# Patient Record
Sex: Female | Born: 1975 | Race: White | Hispanic: No | Marital: Married | Smoking: Former smoker
Health system: Southern US, Community
[De-identification: ages and names within clinical notes are randomized; demographics above are authoritative.]

## PROBLEM LIST (undated history)

## (undated) DIAGNOSIS — G4733 Obstructive sleep apnea (adult) (pediatric): Secondary | ICD-10-CM

## (undated) DIAGNOSIS — N946 Dysmenorrhea, unspecified: Secondary | ICD-10-CM

## (undated) DIAGNOSIS — I10 Essential (primary) hypertension: Secondary | ICD-10-CM

## (undated) DIAGNOSIS — Z6841 Body Mass Index (BMI) 40.0 and over, adult: Secondary | ICD-10-CM

## (undated) DIAGNOSIS — Z720 Tobacco use: Secondary | ICD-10-CM

## (undated) DIAGNOSIS — M1990 Unspecified osteoarthritis, unspecified site: Secondary | ICD-10-CM

## (undated) DIAGNOSIS — N920 Excessive and frequent menstruation with regular cycle: Secondary | ICD-10-CM

## (undated) DIAGNOSIS — Z87891 Personal history of nicotine dependence: Secondary | ICD-10-CM

## (undated) DIAGNOSIS — K219 Gastro-esophageal reflux disease without esophagitis: Secondary | ICD-10-CM

## (undated) HISTORY — PX: DILATION AND CURETTAGE OF UTERUS: SHX78

## (undated) HISTORY — DX: Personal history of nicotine dependence: Z87.891

## (undated) HISTORY — DX: Excessive and frequent menstruation with regular cycle: N92.0

## (undated) HISTORY — PX: WISDOM TOOTH EXTRACTION: SHX21

## (undated) HISTORY — DX: Essential (primary) hypertension: I10

## (undated) HISTORY — DX: Obstructive sleep apnea (adult) (pediatric): G47.33

## (undated) HISTORY — DX: Morbid (severe) obesity due to excess calories: E66.01

## (undated) HISTORY — PX: BREAST SURGERY: SHX581

## (undated) HISTORY — DX: Body Mass Index (BMI) 40.0 and over, adult: Z684

## (undated) HISTORY — DX: Unspecified osteoarthritis, unspecified site: M19.90

## (undated) HISTORY — DX: Tobacco use: Z72.0

## (undated) HISTORY — DX: Dysmenorrhea, unspecified: N94.6

---

## 2004-03-27 ENCOUNTER — Emergency Department: Payer: Self-pay | Admitting: Unknown Physician Specialty

## 2004-10-21 ENCOUNTER — Emergency Department: Payer: Self-pay | Admitting: Emergency Medicine

## 2005-07-16 ENCOUNTER — Emergency Department: Payer: Self-pay | Admitting: Emergency Medicine

## 2006-03-26 ENCOUNTER — Emergency Department: Payer: Self-pay | Admitting: Emergency Medicine

## 2006-06-19 HISTORY — PX: OTHER SURGICAL HISTORY: SHX169

## 2008-06-03 ENCOUNTER — Ambulatory Visit: Payer: Self-pay | Admitting: Internal Medicine

## 2009-02-26 ENCOUNTER — Ambulatory Visit: Payer: Self-pay | Admitting: Internal Medicine

## 2009-06-16 ENCOUNTER — Ambulatory Visit: Payer: Self-pay | Admitting: Internal Medicine

## 2009-08-03 ENCOUNTER — Ambulatory Visit: Payer: Self-pay | Admitting: Internal Medicine

## 2009-11-13 ENCOUNTER — Ambulatory Visit: Payer: Self-pay | Admitting: Internal Medicine

## 2010-04-13 ENCOUNTER — Ambulatory Visit: Payer: Self-pay | Admitting: Internal Medicine

## 2010-08-02 ENCOUNTER — Ambulatory Visit: Payer: Self-pay | Admitting: Internal Medicine

## 2011-05-02 ENCOUNTER — Ambulatory Visit: Payer: Self-pay | Admitting: Internal Medicine

## 2011-06-20 ENCOUNTER — Ambulatory Visit: Payer: Self-pay | Admitting: Internal Medicine

## 2011-06-20 HISTORY — PX: OTHER SURGICAL HISTORY: SHX169

## 2012-03-02 ENCOUNTER — Ambulatory Visit: Payer: Self-pay

## 2012-03-30 ENCOUNTER — Ambulatory Visit: Payer: Self-pay | Admitting: Medical

## 2012-03-30 LAB — RAPID STREP-A WITH REFLX: Micro Text Report: NEGATIVE

## 2012-04-01 LAB — BETA STREP CULTURE(ARMC)

## 2012-07-02 ENCOUNTER — Ambulatory Visit: Payer: Self-pay

## 2012-07-02 LAB — CBC WITH DIFFERENTIAL/PLATELET
Basophil #: 0.1 10*3/uL (ref 0.0–0.1)
Basophil %: 0.7 %
Eosinophil #: 0.3 10*3/uL (ref 0.0–0.7)
Eosinophil %: 2.2 %
HCT: 39.6 % (ref 35.0–47.0)
Lymphocyte #: 4.2 10*3/uL — ABNORMAL HIGH (ref 1.0–3.6)
Lymphocyte %: 29 %
MCV: 89 fL (ref 80–100)
RBC: 4.43 10*6/uL (ref 3.80–5.20)
RDW: 13.5 % (ref 11.5–14.5)
WBC: 14.4 10*3/uL — ABNORMAL HIGH (ref 3.6–11.0)

## 2013-04-14 ENCOUNTER — Ambulatory Visit
Admission: RE | Admit: 2013-04-14 | Discharge: 2013-04-14 | Disposition: A | Discharge status: Home / Self Care | Source: Ambulatory Visit | Attending: Family Medicine | Admitting: Family Medicine

## 2013-04-14 ENCOUNTER — Ambulatory Visit (INDEPENDENT_AMBULATORY_CARE_PROVIDER_SITE_OTHER): Admitting: Family Medicine

## 2013-04-14 ENCOUNTER — Encounter: Payer: Self-pay | Admitting: Family Medicine

## 2013-04-14 VITALS — BP 140/86 | HR 93 | Temp 98.4°F | Ht 66.25 in | Wt 322.0 lb

## 2013-04-14 DIAGNOSIS — M25519 Pain in unspecified shoulder: Secondary | ICD-10-CM

## 2013-04-14 DIAGNOSIS — R5383 Other fatigue: Secondary | ICD-10-CM

## 2013-04-14 DIAGNOSIS — M25511 Pain in right shoulder: Secondary | ICD-10-CM

## 2013-04-14 DIAGNOSIS — M129 Arthropathy, unspecified: Secondary | ICD-10-CM

## 2013-04-14 DIAGNOSIS — Z720 Tobacco use: Secondary | ICD-10-CM

## 2013-04-14 DIAGNOSIS — F17200 Nicotine dependence, unspecified, uncomplicated: Secondary | ICD-10-CM

## 2013-04-14 DIAGNOSIS — M7500 Adhesive capsulitis of unspecified shoulder: Secondary | ICD-10-CM

## 2013-04-14 DIAGNOSIS — I10 Essential (primary) hypertension: Secondary | ICD-10-CM

## 2013-04-14 DIAGNOSIS — Z3202 Encounter for pregnancy test, result negative: Secondary | ICD-10-CM

## 2013-04-14 DIAGNOSIS — Z131 Encounter for screening for diabetes mellitus: Secondary | ICD-10-CM

## 2013-04-14 DIAGNOSIS — M1990 Unspecified osteoarthritis, unspecified site: Secondary | ICD-10-CM

## 2013-04-14 DIAGNOSIS — G4733 Obstructive sleep apnea (adult) (pediatric): Secondary | ICD-10-CM

## 2013-04-14 DIAGNOSIS — R5381 Other malaise: Secondary | ICD-10-CM

## 2013-04-14 DIAGNOSIS — M7501 Adhesive capsulitis of right shoulder: Secondary | ICD-10-CM

## 2013-04-14 DIAGNOSIS — Z23 Encounter for immunization: Secondary | ICD-10-CM

## 2013-04-14 LAB — CBC WITH DIFFERENTIAL/PLATELET
Basophils Relative: 0.5 % (ref 0.0–3.0)
Eosinophils Absolute: 0.4 10*3/uL (ref 0.0–0.7)
Eosinophils Relative: 2.2 % (ref 0.0–5.0)
HCT: 41.3 % (ref 36.0–46.0)
Hemoglobin: 13.8 g/dL (ref 12.0–15.0)
Lymphocytes Relative: 30.1 % (ref 12.0–46.0)
Lymphs Abs: 5.4 10*3/uL — ABNORMAL HIGH (ref 0.7–4.0)
MCHC: 33.5 g/dL (ref 30.0–36.0)
Monocytes Relative: 5.8 % (ref 3.0–12.0)
Neutro Abs: 11.1 10*3/uL — ABNORMAL HIGH (ref 1.4–7.7)
RBC: 4.61 Mil/uL (ref 3.87–5.11)
RDW: 14.1 % (ref 11.5–14.6)
WBC: 18.1 10*3/uL (ref 4.5–10.5)

## 2013-04-14 LAB — BASIC METABOLIC PANEL
BUN: 15 mg/dL (ref 6–23)
CO2: 32 mEq/L (ref 19–32)
Calcium: 9.7 mg/dL (ref 8.4–10.5)
Glucose, Bld: 99 mg/dL (ref 70–99)
Potassium: 4.5 mEq/L (ref 3.5–5.1)
Sodium: 138 mEq/L (ref 135–145)

## 2013-04-14 NOTE — Patient Instructions (Signed)
REFERRAL: GO THE THE FRONT ROOM AT THE ENTRANCE OF OUR CLINIC, NEAR CHECK IN. ASK FOR Andrea Hull. SHE WILL HELP YOU SET UP YOUR REFERRAL. DATE: TIME:  

## 2013-04-14 NOTE — Progress Notes (Signed)
Date:  04/14/2013   Name:  Andrea Hull   DOB:  03-04-1976   MRN:  [id number] Gender: female Age: 37 y.o.  Primary Physician:  Hannah Beat, Andrea Hull   Chief Complaint: New Patient   History of Present Illness:  Andrea Hull is a 37 y.o. pleasant patient who presents with the following:  New patient: down 26 pounds  OSA with cpap Pepcid as needed  Some knee OA  1 1/2 months ago, for about since then, it was hurting NOW has lost a lot of motion. If jerking it, etc, can be extremely painful.  R - very limited with her str and ROM with abduction Not clear of any distinct injury She has tried taking some OTC NSAIDS Painful at night and wakes her up from sleep Pain throughout the shoulder  Has had a h/o htn in the past, wants to keep trying to lose weight  1 ppd smoker  H/o GERD  Body mass index is 51.57 kg/(m^2).    There are no active problems to display for this patient.   Past Medical History  Diagnosis Date  . Arthritis   . Hypertension   . Chicken pox   . Urinary tract infection     Past Surgical History  Procedure Laterality Date  . Cesarean section    . Breast biospy  2013  . Miscarriage  2008    History   Social History  . Marital Status: Married    Spouse Name: N/A    Number of Children: N/A  . Years of Education: N/A   Occupational History  . Not on file.   Social History Main Topics  . Smoking status: Current Every Day Smoker -- 1.00 packs/day    Types: Cigarettes  . Smokeless tobacco: Never Used  . Alcohol Use: Yes     Comment: rare  . Drug Use: No  . Sexual Activity: Not on file   Other Topics Concern  . Not on file   Social History Narrative  . No narrative on file    Family History  Problem Relation Age of Onset  . Arthritis Mother   . Alcohol abuse Father   . Arthritis Father   . Hyperlipidemia Father   . Hypertension Father   . Diabetes Father   . Personality disorder Sister   . Anxiety disorder Sister   .  Hypertension Brother   . Diabetes Maternal Grandmother   . Lung cancer Maternal Grandfather   . Alcohol abuse Paternal Grandmother   . Breast cancer Paternal Grandmother   . Alcohol abuse Paternal Grandfather   . Arthritis Paternal Grandfather   . Heart disease Paternal Grandfather   . Hypertension Sister     No Known Allergies  Medication list has been reviewed and updated.  No outpatient prescriptions prior to visit.   No facility-administered medications prior to visit.    Review of Systems:   GEN: No fevers, chills. Nontoxic. Primarily MSK c/o today. MSK: Detailed in the HPI GI: tolerating PO intake without difficulty Neuro: No numbness, parasthesias, or tingling associated. Otherwise, the pertinent positives and negatives are listed above and in the HPI, otherwise a full review of systems has been reviewed and is negative unless noted positive.   Physical Examination: BP 140/86  Pulse 93  Temp(Src) 98.4 F (36.9 C) (Oral)  Ht 5' 6.25" (1.683 m)  Wt 322 lb (146.058 kg)  BMI 51.57 kg/m2  LMP 03/27/2013  Ideal Body Weight: Weight in (lb) to have BMI =  25: 155.7   GEN: Well-developed,well-nourished,in no acute distress; alert,appropriate and cooperative throughout examination HEENT: Normocephalic and atraumatic without obvious abnormalities. Ears, externally no deformities CV: rrr, no m/g/r PULM: Breathing comfortably in no respiratory distress EXT: No clubbing, cyanosis, or edema PSYCH: Normally interactive. Cooperative during the interview. Pleasant. Friendly and conversant. Not anxious or depressed appearing. Normal, full affect.  Shoulder: R Inspection: No muscle wasting Ecchymosis/edema: neg  AC joint, scapula, clavicle: mod ttp Cervical spine: NT, full ROM Spurling's: neg Abduction: abduction to 110 degrees, str 4/5 Flexion: flexion to 110 deg, 4/5 IR, lift-off: 5/5, in 90 deg of abd, 80 degree loss of motion compared to contralateral side ER at  neutral: 5/5, in 90 deg of abd, loss of 70 deg compared to contralateral side AC crossover: pos Neer: pos, motion limited Hawkins: pos Drop Test: neg Empty Can: pos Supraspinatus insertion: mild-mod T Bicipital groove: tender Speed's: pos Yergason's: pos Sulcus sign: neg Scapular dyskinesis: marked with attempted abd C5-T1 intact  Neuro: Sensation intact Grip 5/5   Assessment and Plan:  Right shoulder pain - Plan: DG Shoulder Right, MR Shoulder Right Wo Contrast: 6 weeks of R shoulder pain with failure to progress and worsening pain, markedly limiting in a 37 year old. Likely adhesive capsulitis, but primary concern is potential supraspinatus tear as primary cause of adhesive capsulitis. Obtain an MRI of the Right shoulder without contrast to help define if patient has full thickness vs. High grade partial thickness rotator cuff tear to help delineate surgical vs. Conservative management plan of care.   XR, UPT neg, will f/u 10 days for serum hcg prior to xr.  Need for prophylactic vaccination and inoculation against influenza - Plan: Flu Vaccine QUAD 37+ mos PF IM (Fluarix)  Frozen shoulder, right - Plan: Basic metabolic panel, TSH  Screening for diabetes mellitus - Plan: Basic metabolic panel  Other malaise and fatigue - Plan: CBC with Differential, TSH  Pregnancy examination or test, negative result - Plan: POCT urine pregnancy, hCG, quantitative, pregnancy  Sleep apnea, obstructive: cont. CPAP  Tobacco abuse: attempt to dc when can  Hypertension: weight loss  Arthritis  Orders Today:  Orders Placed This Encounter  Procedures  . DG Shoulder Right    Standing Status: Future     Number of Occurrences: 1     Standing Expiration Date: 06/14/2014    Order Specific Question:  Preferred imaging location?    Answer:  [facility]    Order Specific Question:  Reason for exam:    Answer:  right shoulder pain  . MR Shoulder Right Wo Contrast    Standing Status:  Future     Number of Occurrences:      Standing Expiration Date: 06/14/2014    Order Specific Question:  Does the patient have a pacemaker, internal devices, implants, aneury    Answer:  No    Order Specific Question:  Preferred imaging location?    Answer:  External    Order Specific Question:  Reason for exam:    Answer:  concern for potential cuff tear, R shoulder pain x 6 weeks  . Flu Vaccine QUAD 37+ mos PF IM (Fluarix)  . Basic metabolic panel  . CBC with Differential  . TSH  . hCG, quantitative, pregnancy    Standing Status: Future     Number of Occurrences:      Standing Expiration Date: 06/14/2013  . POCT urine pregnancy    Updated Medication List: (Includes new medications, updates to list, dose  adjustments) Meds ordered this encounter  Medications  . famotidine (PEPCID) 20 MG tablet    Sig: Take 20 mg by mouth as needed for heartburn.    Medications Discontinued: There are no discontinued medications.    Signed,  Andrea Galea. Banks Chaikin, Andrea Hull, CAQ Sports Medicine  [facility] at [facility] [address] [place] [address] Phone: [phone] Fax: [phone]

## 2013-04-15 ENCOUNTER — Encounter: Payer: Self-pay | Admitting: Family Medicine

## 2013-04-15 ENCOUNTER — Ambulatory Visit

## 2013-04-15 DIAGNOSIS — R6889 Other general symptoms and signs: Secondary | ICD-10-CM

## 2013-04-15 DIAGNOSIS — G4733 Obstructive sleep apnea (adult) (pediatric): Secondary | ICD-10-CM | POA: Insufficient documentation

## 2013-04-15 DIAGNOSIS — M1990 Unspecified osteoarthritis, unspecified site: Secondary | ICD-10-CM | POA: Insufficient documentation

## 2013-04-15 DIAGNOSIS — Z720 Tobacco use: Secondary | ICD-10-CM | POA: Insufficient documentation

## 2013-04-15 DIAGNOSIS — Z87891 Personal history of nicotine dependence: Secondary | ICD-10-CM | POA: Insufficient documentation

## 2013-04-15 DIAGNOSIS — I10 Essential (primary) hypertension: Secondary | ICD-10-CM | POA: Insufficient documentation

## 2013-04-15 LAB — T3, FREE: T3, Free: 2.7 pg/mL (ref 2.3–4.2)

## 2013-04-15 LAB — T4, FREE: Free T4: 0.77 ng/dL (ref 0.60–1.60)

## 2013-04-16 ENCOUNTER — Encounter: Payer: Self-pay | Admitting: Family Medicine

## 2013-04-24 ENCOUNTER — Other Ambulatory Visit (INDEPENDENT_AMBULATORY_CARE_PROVIDER_SITE_OTHER)

## 2013-04-24 DIAGNOSIS — Z3202 Encounter for pregnancy test, result negative: Secondary | ICD-10-CM

## 2013-04-25 ENCOUNTER — Telehealth (INDEPENDENT_AMBULATORY_CARE_PROVIDER_SITE_OTHER)

## 2013-04-25 DIAGNOSIS — Z3202 Encounter for pregnancy test, result negative: Secondary | ICD-10-CM

## 2013-04-25 LAB — HCG, QUANTITATIVE, PREGNANCY: hCG, Beta Chain, Quant, S: 0.4 m[IU]/mL

## 2013-04-25 NOTE — Telephone Encounter (Signed)
Patient notified to go ahead and reschedule her MRI she has scheduled for Monday, pending BHCG & Shoulder X-rays results.

## 2013-04-25 NOTE — Telephone Encounter (Signed)
Pt left v/m requesting lab results cb today; pt said scheduled for MRI on [name] and needs lab results prior to MRI.

## 2013-04-25 NOTE — Telephone Encounter (Signed)
Patient notified BHCG came back .40 mIU/ml.   Per Dr. Kennis Carina on Monday 04/28/2013 @ 8:00am.  We will send it out STAT and go from there.  Patient is in agreement.

## 2013-04-28 ENCOUNTER — Other Ambulatory Visit (INDEPENDENT_AMBULATORY_CARE_PROVIDER_SITE_OTHER)

## 2013-04-28 ENCOUNTER — Ambulatory Visit (INDEPENDENT_AMBULATORY_CARE_PROVIDER_SITE_OTHER)
Admission: RE | Admit: 2013-04-28 | Discharge: 2013-04-28 | Disposition: A | Discharge status: Home / Self Care | Source: Ambulatory Visit | Attending: Family Medicine | Admitting: Family Medicine

## 2013-04-28 DIAGNOSIS — Z3201 Encounter for pregnancy test, result positive: Secondary | ICD-10-CM

## 2013-04-28 DIAGNOSIS — M25519 Pain in unspecified shoulder: Secondary | ICD-10-CM

## 2013-04-28 LAB — HCG, QUANTITATIVE, PREGNANCY: hCG, Beta Chain, Quant, S: 0 m[IU]/mL

## 2013-05-07 ENCOUNTER — Encounter: Payer: Self-pay | Admitting: Family Medicine

## 2013-05-07 ENCOUNTER — Ambulatory Visit (INDEPENDENT_AMBULATORY_CARE_PROVIDER_SITE_OTHER): Admitting: Family Medicine

## 2013-05-07 VITALS — BP 160/80 | HR 99 | Temp 98.3°F | Ht 66.25 in | Wt 324.5 lb

## 2013-05-07 DIAGNOSIS — M7501 Adhesive capsulitis of right shoulder: Secondary | ICD-10-CM

## 2013-05-07 DIAGNOSIS — M7500 Adhesive capsulitis of unspecified shoulder: Secondary | ICD-10-CM

## 2013-05-07 NOTE — Patient Instructions (Signed)
F/u 6-8 weeks

## 2013-05-07 NOTE — Progress Notes (Signed)
Pre-visit discussion using our clinic review tool. No additional management support is needed unless otherwise documented below in the visit note.  

## 2013-05-07 NOTE — Progress Notes (Signed)
Date:  05/07/2013   Name:  Andrea Hull   DOB:  1975/09/07   MRN:  [id number] Gender: female Age: 37 y.o.  Primary Physician:  Hannah Beat, MD   Chief Complaint: Follow-up   Subjective:   History of Present Illness:  Andrea Hull is a 37 y.o. very pleasant female patient who presents with the following:  Patient is here in followup regarding her RIGHT frozen shoulder. Her last office visit she had some dramatic loss of motion in abduction and flexion, so I obtained an MRI of her RIGHT shoulder to evaluate for potential rotator cuff tear. Her cuff is intact with no evidence of full-thickness tear. She did have some partial-thickness tearing to a small degree and adhesive capsulitis on her MRI. She has been active, and her motion is actually gotten quite a bit better in the last few weeks since our prior visit.  I did review her MRI myself last week.  Past Medical History, Surgical History, Social History, Family History, Problem List, Medications, and Allergies have been reviewed and updated if relevant.  Review of Systems:  GEN: No fevers, chills. Nontoxic. Primarily MSK c/o today. MSK: Detailed in the HPI GI: tolerating PO intake without difficulty Neuro: No numbness, parasthesias, or tingling associated. Otherwise the pertinent positives of the ROS are noted above.   Objective:   Physical Examination: BP 160/80  Pulse 99  Temp(Src) 98.3 F (36.8 C) (Oral)  Ht 5' 6.25" (1.683 m)  Wt 324 lb 8 oz (147.192 kg)  BMI 51.97 kg/m2  LMP 04/04/2013   GEN: WDWN, NAD, Non-toxic, Alert & Oriented x 3 HEENT: Atraumatic, Normocephalic.  Ears and Nose: No external deformity. EXTR: No clubbing/cyanosis/edema NEURO: Normal gait.  PSYCH: Normally interactive. Conversant. Not depressed or anxious appearing.  Calm demeanor.    RIGHT shoulder: Nontender at the a.c. Joint. Nontender near the supraspinatus insertion. Abduction lacks 25. Flexion lacks 10-15. There is a  comfortable loss of approximately 10-15 and external rotation compared to the contralateral side and a 35 loss of internal range of motion compared to the contralateral side, both in 90 of abduction. This is dramatically improved compared to her prior office visit. Strength is 5/5.  Radiology: Dg Shoulder Right  04/28/2013   CLINICAL DATA:  Right shoulder pain.  EXAM: RIGHT SHOULDER - 2+ VIEW  COMPARISON:  None.  FINDINGS: Three views of the right shoulder reveal the bones to be adequately mineralized. There is no evidence of an acute fracture nor dislocation. There is faint calcific density adjacent to the greater tuberosity which may reflect an old avulsion fracture fragment or could reflect calcific tendinitis or bursitis. The bony glenoid appears intact and the [facility] joint is normal in appearance.  IMPRESSION: There is no acute bony abnormality of the right shoulder. I cannot exclude calcific tendinitis or bursitis. Or less likely an old avulsion from the greater tuberosity.   Electronically Signed   By: David  [place]   On: 04/28/2013 15:38    Assessment & Plan:    Frozen shoulder syndrome, right  She is much better. For now, I am just going to have her do some aggressive home physical therapy, and she thinks that she will be able to be compliant with this.  Patient Instructions  F/u 6-8 weeks   Orders Today:  No orders of the defined types were placed in this encounter.    New medications, updates to list, dose adjustments: No orders of the defined types were placed in this encounter.  Signed,  Elpidio Galea. Braylynn Lewing, MD, CAQ Sports Medicine  [facility] at [facility] [address] [place] [address] Phone: [phone] Fax: [phone]  Updated Complete Medication List:   Medication List       This list is accurate as of: 05/07/13 11:59 PM.  Always use your most recent med list.               famotidine 20 MG tablet  Commonly known as:  PEPCID  Take  20 mg by mouth as needed for heartburn.

## 2013-05-16 ENCOUNTER — Encounter: Payer: Self-pay | Admitting: Family Medicine

## 2013-07-02 ENCOUNTER — Ambulatory Visit: Admitting: Family Medicine

## 2014-03-27 ENCOUNTER — Ambulatory Visit

## 2014-03-27 ENCOUNTER — Ambulatory Visit (INDEPENDENT_AMBULATORY_CARE_PROVIDER_SITE_OTHER)

## 2014-03-27 DIAGNOSIS — Z23 Encounter for immunization: Secondary | ICD-10-CM

## 2014-06-29 ENCOUNTER — Ambulatory Visit (INDEPENDENT_AMBULATORY_CARE_PROVIDER_SITE_OTHER): Admitting: Family Medicine

## 2014-06-29 ENCOUNTER — Encounter: Payer: Self-pay | Admitting: Family Medicine

## 2014-06-29 VITALS — BP 152/90 | HR 89 | Temp 98.1°F | Ht 66.25 in | Wt 349.2 lb

## 2014-06-29 DIAGNOSIS — S142XXA Injury of nerve root of cervical spine, initial encounter: Secondary | ICD-10-CM

## 2014-06-29 DIAGNOSIS — S46811A Strain of other muscles, fascia and tendons at shoulder and upper arm level, right arm, initial encounter: Secondary | ICD-10-CM

## 2014-06-29 MED ORDER — AMITRIPTYLINE HCL 25 MG PO TABS: 25.0000 mg | ORAL_TABLET | Freq: Every day | ORAL | Status: DC

## 2014-06-29 MED ORDER — PREDNISONE 20 MG PO TABS: ORAL_TABLET | ORAL | Status: DC

## 2014-06-29 NOTE — Progress Notes (Signed)
Dr. Karleen Hampshire T. Earma Nicolaou, MD, CAQ Sports Medicine Primary [facility] and Sports Medicine [address] [place], [address] Phone: [phone] Fax: [phone]  06/29/2014  Patient: Andrea Hull, MRN: [id number], DOB: Jun 21, 1975, 39 y.o.  Primary Physician:  Hannah Beat, MD  Chief Complaint: Shoulder Pain  Subjective:   Andrea Hull is a 39 y.o. very pleasant female patient who presents with the following:  Thinks pulling back muscle on 12/31 on the R side and around the trap on the right. Dealt with this for about a week and about then not able to sleep all that much and felt some pain in her armput. Constantly moving her armpit. Slept on the recliner - and sometimes it will help with arm up above her head. Constantly feels like falling asleep. Muscle is mostly better.   Armpit and down in the arm. At this point, the area in her trapezius is improving, but she does have pain in the middle of her armpit, and she also has some paresthesias in abnormal sensations and pains going down her arm.  Past Medical History, Surgical History, Social History, Family History, Problem List, Medications, and Allergies have been reviewed and updated if relevant.  GEN: No fevers, chills. Nontoxic. Primarily MSK c/o today. MSK: Detailed in the HPI GI: tolerating PO intake without difficulty Neuro: as above Otherwise the pertinent positives of the ROS are noted above.   Objective:   BP 152/90 mmHg  Pulse 89  Temp(Src) 98.1 F (36.7 C) (Oral)  Ht 5' 6.25" (1.683 m)  Wt 349 lb 4 oz (158.419 kg)  BMI 55.93 kg/m2  LMP 06/16/2014   GEN: Well-developed,well-nourished,in no acute distress; alert,appropriate and cooperative throughout examination HEENT: Normocephalic and atraumatic without obvious abnormalities. Ears, externally no deformities PULM: Breathing comfortably in no respiratory distress EXT: No clubbing, cyanosis, or edema PSYCH: Normally interactive. Cooperative during the  interview. Pleasant. Friendly and conversant. Not anxious or depressed appearing. Normal, full affect.  CERVICAL SPINE EXAM Range of motion: Flexion, extension, lateral bending, and rotation: mild restriction, more moving to the LEFT and looking to the LEFT Pain with terminal motion: mild Spinous Processes: NT SCM: NT Upper paracervical muscles: no significant Upper traps: tender on the RIGHT upper C5-T1 intact, sensation and motor grossly intact, however, slight abnormal sensation and paresthesias surrounding the RIGHT  Arm.  Full range of motion.  No impingement.  Rotator cuff testing, 5/5 strength  Radiology: No results found.  Assessment and Plan:   Trapezius strain, right, initial encounter  Cervical nerve root injury, initial encounter  Grade 1 trapezius strain, and at the same time, the mechanism would suggest a cervical nerve root injury that then causes some altered sensations and paresthesias.  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  Range of motion.  Trial of prednisone burst and taper.  Amitriptyline at night with follow-up in 4-6 weeks if no improvement.  Follow-up: No Follow-up on file.  New Prescriptions   AMITRIPTYLINE (ELAVIL) 25 MG TABLET    Take 1 tablet (25 mg total) by mouth at bedtime.   PREDNISONE (DELTASONE) 20 MG TABLET    2 tabs po for 4 days, then 1 po for 4 days   No orders of the defined types were placed in this encounter.    Signed,  Elpidio Galea. Carr Shartzer, MD   Patient's Medications  New Prescriptions   AMITRIPTYLINE (ELAVIL) 25 MG TABLET    Take 1 tablet (25 mg total) by mouth at bedtime.   PREDNISONE (DELTASONE)  20 MG TABLET    2 tabs po for 4 days, then 1 po for 4 days  Previous Medications   FAMOTIDINE (PEPCID) 20 MG TABLET    Take 20 mg by mouth as needed for heartburn.  Modified Medications   No medications on file  Discontinued Medications   No medications on file

## 2014-06-29 NOTE — Progress Notes (Signed)
Pre visit review using our clinic review tool, if applicable. No additional management support is needed unless otherwise documented below in the visit note. 

## 2014-06-30 ENCOUNTER — Telehealth: Payer: Self-pay | Admitting: Family Medicine

## 2014-06-30 NOTE — Telephone Encounter (Signed)
emmi emailed °

## 2014-10-02 ENCOUNTER — Other Ambulatory Visit: Payer: Self-pay | Admitting: Family Medicine

## 2014-10-02 NOTE — Telephone Encounter (Signed)
Last office visit 06/29/2014.  Last refilled 06/29/2014 for #30 with 2 refills.  Ok to refill?

## 2014-10-20 ENCOUNTER — Encounter: Payer: Self-pay | Admitting: Family Medicine

## 2014-10-20 ENCOUNTER — Ambulatory Visit (INDEPENDENT_AMBULATORY_CARE_PROVIDER_SITE_OTHER): Admitting: Family Medicine

## 2014-10-20 VITALS — BP 142/84 | HR 80 | Temp 98.0°F | Wt 350.0 lb

## 2014-10-20 DIAGNOSIS — J069 Acute upper respiratory infection, unspecified: Secondary | ICD-10-CM

## 2014-10-20 NOTE — Assessment & Plan Note (Signed)
Anticipate viral given short duration. Supportive care as per instructions. Update if not improving as per expected.

## 2014-10-20 NOTE — Progress Notes (Signed)
   BP 142/84 mmHg  Pulse 80  Temp(Src) 98 F (36.7 C) (Oral)  Wt 350 lb (158.759 kg)  LMP 09/04/2014   CC: congestion  Subjective:    Patient ID: Andrea Hull, female    DOB: 09/01/1975, 39 y.o.   MRN: [id number]  HPI: Andrea Loronammie L Spruell is a 39 y.o. female presenting on 10/20/2014 for Cough   Endorses heat leading to high blood pressure.   Mom and son are passing sickness back and forth over last 2 weeks. Mom's started with sinus congestion, rhinorrhea (clear), PNDrainage over the past week. Yesterday started feeling better. L gum pain a few days ago now better.   No fevers/chills, ear pain, no coughing, headaches.  1 ppd smoker No h/o asthma. Does use CPAP for OSA.   Self treated with allegra and nyquil.  Her dad is coming into gown this weekend - he had recent open wound surgery. She wanted to make sure safe to be around him.  Relevant past medical, surgical, family and social history reviewed and updated as indicated. Interim medical history since our last visit reviewed. Allergies and medications reviewed and updated. Current Outpatient Prescriptions on File Prior to Visit  Medication Sig  . famotidine (PEPCID) 20 MG tablet Take 20 mg by mouth as needed for heartburn.   No current facility-administered medications on file prior to visit.    Review of Systems Per HPI unless specifically indicated above     Objective:    BP 142/84 mmHg  Pulse 80  Temp(Src) 98 F (36.7 C) (Oral)  Wt 350 lb (158.759 kg)  LMP 09/04/2014  Wt Readings from Last 3 Encounters:  10/20/14 350 lb (158.759 kg)  06/29/14 349 lb 4 oz (158.419 kg)  05/07/13 324 lb 8 oz (147.192 kg)    Physical Exam  Constitutional: She appears well-developed and well-nourished. No distress.  HENT:  Head: Normocephalic and atraumatic.  Right Ear: Hearing, tympanic membrane, external ear and ear canal normal.  Left Ear: Hearing, tympanic membrane, external ear and ear canal normal.  Nose: Mucosal edema  (mild) present. No rhinorrhea. Right sinus exhibits no maxillary sinus tenderness and no frontal sinus tenderness. Left sinus exhibits no maxillary sinus tenderness and no frontal sinus tenderness.  Mouth/Throat: Uvula is midline and mucous membranes are normal. Posterior oropharyngeal erythema (mild) present. No oropharyngeal exudate, posterior oropharyngeal edema or tonsillar abscesses.  Eyes: Conjunctivae and EOM are normal. Pupils are equal, round, and reactive to light. No scleral icterus.  Neck: Normal range of motion. Neck supple.  Cardiovascular: Normal rate, regular rhythm, normal heart sounds and intact distal pulses.   No murmur heard. Pulmonary/Chest: Effort normal and breath sounds normal. No respiratory distress. She has no wheezes. She has no rales.  Lymphadenopathy:    She has no cervical adenopathy.  Skin: Skin is warm and dry. No rash noted.  Nursing note and vitals reviewed.      Assessment & Plan:   Problem List Items Addressed This Visit    Viral URI - Primary    Anticipate viral given short duration. Supportive care as per instructions. Update if not improving as per expected.          Follow up plan: Return if symptoms worsen or fail to improve.

## 2014-10-20 NOTE — Progress Notes (Signed)
Pre visit review using our clinic review tool, if applicable. No additional management support is needed unless otherwise documented below in the visit note. 

## 2014-10-20 NOTE — Patient Instructions (Signed)
You have a viral upper respiratory infection. Antibiotics are not needed for this.  Viral infections usually take 7-10 days to resolve.   Push fluids and plenty of rest.   Please return if you are not improving as expected, or if you have high fevers (>101.5) or difficulty swallowing or worsening productive cough. Call clinic with questions.  Good to see you today. I hope you start feeling better soon.  

## 2014-11-10 ENCOUNTER — Telehealth: Payer: Self-pay | Admitting: Family Medicine

## 2014-11-10 NOTE — Telephone Encounter (Signed)
Pt is requesting a thyroid test when she get her cpe labs done .

## 2014-11-10 NOTE — Telephone Encounter (Signed)
Lab appointment is scheduled for 11/20/2014 at 9:15 am.

## 2014-11-16 ENCOUNTER — Observation Stay
Admission: EM | Admit: 2014-11-16 | Discharge: 2014-11-19 | Disposition: A | Discharge status: Home / Self Care | Attending: Surgery | Admitting: Surgery

## 2014-11-16 ENCOUNTER — Emergency Department

## 2014-11-16 DIAGNOSIS — F17210 Nicotine dependence, cigarettes, uncomplicated: Secondary | ICD-10-CM | POA: Diagnosis not present

## 2014-11-16 DIAGNOSIS — R110 Nausea: Secondary | ICD-10-CM | POA: Insufficient documentation

## 2014-11-16 DIAGNOSIS — K8010 Calculus of gallbladder with chronic cholecystitis without obstruction: Secondary | ICD-10-CM | POA: Diagnosis not present

## 2014-11-16 DIAGNOSIS — R1011 Right upper quadrant pain: Secondary | ICD-10-CM

## 2014-11-16 DIAGNOSIS — Z6841 Body Mass Index (BMI) 40.0 and over, adult: Secondary | ICD-10-CM | POA: Diagnosis not present

## 2014-11-16 DIAGNOSIS — R1013 Epigastric pain: Secondary | ICD-10-CM

## 2014-11-16 DIAGNOSIS — K219 Gastro-esophageal reflux disease without esophagitis: Secondary | ICD-10-CM | POA: Diagnosis not present

## 2014-11-16 DIAGNOSIS — I10 Essential (primary) hypertension: Secondary | ICD-10-CM | POA: Diagnosis not present

## 2014-11-16 DIAGNOSIS — M1990 Unspecified osteoarthritis, unspecified site: Secondary | ICD-10-CM | POA: Diagnosis not present

## 2014-11-16 DIAGNOSIS — K851 Biliary acute pancreatitis without necrosis or infection: Secondary | ICD-10-CM | POA: Diagnosis present

## 2014-11-16 DIAGNOSIS — K859 Acute pancreatitis, unspecified: Secondary | ICD-10-CM

## 2014-11-16 DIAGNOSIS — G4733 Obstructive sleep apnea (adult) (pediatric): Secondary | ICD-10-CM | POA: Insufficient documentation

## 2014-11-16 DIAGNOSIS — K8020 Calculus of gallbladder without cholecystitis without obstruction: Secondary | ICD-10-CM

## 2014-11-16 LAB — CBC WITH DIFFERENTIAL/PLATELET
Basophils Absolute: 0.1 10*3/uL (ref 0–0.1)
Basophils Relative: 1 %
EOS PCT: 3 %
Eosinophils Absolute: 0.5 10*3/uL (ref 0–0.7)
HCT: 42.4 % (ref 35.0–47.0)
Hemoglobin: 14.1 g/dL (ref 12.0–16.0)
LYMPHS PCT: 22 %
Lymphs Abs: 4.2 10*3/uL — ABNORMAL HIGH (ref 1.0–3.6)
MCH: 30.4 pg (ref 26.0–34.0)
MCHC: 33.2 g/dL (ref 32.0–36.0)
MCV: 91.6 fL (ref 80.0–100.0)
MONOS PCT: 7 %
Monocytes Absolute: 1.4 10*3/uL — ABNORMAL HIGH (ref 0.2–0.9)
Neutro Abs: 13.1 10*3/uL — ABNORMAL HIGH (ref 1.4–6.5)
Neutrophils Relative %: 67 %
Platelets: 311 10*3/uL (ref 150–440)
RBC: 4.63 MIL/uL (ref 3.80–5.20)
RDW: 14.6 % — AB (ref 11.5–14.5)
WBC: 19.3 10*3/uL — AB (ref 3.6–11.0)

## 2014-11-16 LAB — COMPREHENSIVE METABOLIC PANEL
ALK PHOS: 164 U/L — AB (ref 38–126)
ALT: 227 U/L — ABNORMAL HIGH (ref 14–54)
AST: 70 U/L — ABNORMAL HIGH (ref 15–41)
Albumin: 3.9 g/dL (ref 3.5–5.0)
Anion gap: 9 (ref 5–15)
BILIRUBIN TOTAL: 0.8 mg/dL (ref 0.3–1.2)
BUN: 14 mg/dL (ref 6–20)
CALCIUM: 9.5 mg/dL (ref 8.9–10.3)
CO2: 24 mmol/L (ref 22–32)
Chloride: 104 mmol/L (ref 101–111)
Creatinine, Ser: 0.81 mg/dL (ref 0.44–1.00)
GFR calc Af Amer: >60 mL/min (ref >60)
GLUCOSE: 115 mg/dL — AB (ref 65–99)
Potassium: 3.9 mmol/L (ref 3.5–5.1)
Sodium: 137 mmol/L (ref 135–145)
TOTAL PROTEIN: 8 g/dL (ref 6.5–8.1)

## 2014-11-16 LAB — URINALYSIS COMPLETE WITH MICROSCOPIC (ARMC ONLY)
Bilirubin Urine: NEGATIVE
Glucose, UA: NEGATIVE mg/dL
LEUKOCYTES UA: NEGATIVE
NITRITE: NEGATIVE
Protein, ur: 100 mg/dL — AB
Specific Gravity, Urine: 1.025 (ref 1.005–1.030)
pH: 5 (ref 5.0–8.0)

## 2014-11-16 LAB — TROPONIN I: Troponin I: <0.03 ng/mL (ref <0.031)

## 2014-11-16 LAB — LIPASE, BLOOD: LIPASE: 184 U/L — AB (ref 22–51)

## 2014-11-16 LAB — POCT PREGNANCY, URINE: Preg Test, Ur: NEGATIVE

## 2014-11-16 MED ORDER — MORPHINE SULFATE 4 MG/ML IJ SOLN
4.0000 mg | Freq: Once | INTRAMUSCULAR | Status: AC
  Administered 2014-11-17: 4 mg via INTRAVENOUS

## 2014-11-16 MED ORDER — SODIUM CHLORIDE 0.9 % IV BOLUS (SEPSIS)
1000.0000 mL | Freq: Once | INTRAVENOUS | Status: AC
  Administered 2014-11-17: 1000 mL via INTRAVENOUS

## 2014-11-16 MED ORDER — ONDANSETRON HCL 4 MG/2ML IJ SOLN
4.0000 mg | Freq: Once | INTRAMUSCULAR | Status: AC
  Administered 2014-11-17: 4 mg via INTRAVENOUS

## 2014-11-16 NOTE — ED Notes (Signed)
Pt here with c/o upper and pain since Friday. Pt states that the pain increases when she lays down, pt also states that she has also had nausea. Pt vomited x1 on Friday. Pt was ambulatory to treatment room and pt in NAD at this time, pt has stable vitals.

## 2014-11-16 NOTE — ED Provider Notes (Signed)
[facility] Emergency Department Provider Note  ____________________________________________  Time seen: Approximately 11:35 PM  I have reviewed the triage vital signs and the nursing notes.   HISTORY  Chief Complaint Abdominal Pain    HPI Andrea Hull is a 39 y.o. female who presents with a four-day history of upper abdominal pain. Patient describes 8/10 pain to right upper quadrant and epigastrium waxing/waning pain worse after eating. Pain radiates across the upper abdomen and is associated with nausea. Patient denies fever, chills, vomiting, diarrhea, chest pain, shortness of breath, headache, weakness, numbness, tingling. In addition to being worse with food, patient also states that the pain increases when she lays supine.Patient has never experienced this type of pain before.   Past Medical History  Diagnosis Date  . Arthritis   . Hypertension   . Sleep apnea, obstructive   . Morbid obesity with BMI of 50.0-59.9, adult   . Tobacco abuse     Patient Active Problem List   Diagnosis Date Noted  . Acute gallstone pancreatitis 11/17/2014  . Viral URI 10/20/2014  . Morbid obesity with BMI of 50.0-59.9, adult 04/15/2013  . Sleep apnea, obstructive   . Tobacco abuse   . Hypertension   . Arthritis     Past Surgical History  Procedure Laterality Date  . Cesarean section    . Breast biospy  2013  . Miscarriage  2008    No current outpatient prescriptions on file.  Allergies Review of patient's allergies indicates no known allergies.  Family History  Problem Relation Age of Onset  . Arthritis Mother   . Alcohol abuse Father   . Arthritis Father   . Hyperlipidemia Father   . Hypertension Father   . Diabetes Father   . Personality disorder Sister   . Anxiety disorder Sister   . Hypertension Brother   . Diabetes Maternal Grandmother   . Lung cancer Maternal Grandfather   . Alcohol abuse Paternal Grandmother   . Breast cancer Paternal  Grandmother   . Alcohol abuse Paternal Grandfather   . Arthritis Paternal Grandfather   . Heart disease Paternal Grandfather   . Hypertension Sister     Social History History  Substance Use Topics  . Smoking status: Current Every Day Smoker -- 1.00 packs/day    Types: Cigarettes  . Smokeless tobacco: Never Used  . Alcohol Use: Yes     Comment: rare    Review of Systems Constitutional: No fever/chills Eyes: No visual changes. ENT: No sore throat. Cardiovascular: Denies chest pain. Respiratory: Denies shortness of breath. Gastrointestinal: Positive for abdominal pain.  Positive for nausea, no vomiting.  No diarrhea.  No constipation. Genitourinary: Negative for dysuria. LMP now. Musculoskeletal: Negative for back pain. Skin: Negative for rash. Neurological: Negative for headaches, focal weakness or numbness.  10-point ROS otherwise negative.  ____________________________________________   PHYSICAL EXAM:  VITAL SIGNS: ED Triage Vitals  Enc Vitals Group     BP 11/16/14 2224 146/98 mmHg     Pulse Rate 11/16/14 2224 107     Resp 11/16/14 2224 18     Temp 11/16/14 2224 98.5 F (36.9 C)     Temp Source 11/16/14 2224 Oral     SpO2 11/16/14 2224 100 %     Weight 11/16/14 2224 341 lb (154.677 kg)     Height 11/16/14 2224 5\' 6"  (1.676 m)     Head Cir --      Peak Flow --      Pain Score --  Pain Loc --      Pain Edu? --      Excl. in GC? --     Constitutional: Alert and oriented. Well appearing and in mild acute distress. Eyes: Conjunctivae are normal. PERRL. EOMI. Head: Atraumatic. Nose: No congestion/rhinnorhea. Mouth/Throat: Mucous membranes are moist.  Oropharynx non-erythematous. Neck: No stridor.   Cardiovascular: Normal rate, regular rhythm. Grossly normal heart sounds.  Good peripheral circulation. Respiratory: Normal respiratory effort.  No retractions. Lungs CTAB. Gastrointestinal: Obese, soft, mildly tender to palpation bilateral upper quadrants and  epigastrium without rebound or guarding.. No distention. No abdominal bruits. No CVA tenderness. Musculoskeletal: No lower extremity tenderness nor edema.  No joint effusions. Neurologic:  Normal speech and language. No gross focal neurologic deficits are appreciated. Speech is normal. No gait instability. Skin:  Skin is warm, dry and intact. No rash noted. Psychiatric: Mood and affect are normal. Speech and behavior are normal.  ____________________________________________   LABS (all labs ordered are listed, but only abnormal results are displayed)  Labs Reviewed  CBC WITH DIFFERENTIAL/PLATELET - Abnormal; Notable for the following:    WBC 19.3 (*)    RDW 14.6 (*)    Neutro Abs 13.1 (*)    Lymphs Abs 4.2 (*)    Monocytes Absolute 1.4 (*)    All other components within normal limits  COMPREHENSIVE METABOLIC PANEL - Abnormal; Notable for the following:    Glucose, Bld 115 (*)    AST 70 (*)    ALT 227 (*)    Alkaline Phosphatase 164 (*)    All other components within normal limits  LIPASE, BLOOD - Abnormal; Notable for the following:    Lipase 184 (*)    All other components within normal limits  URINALYSIS COMPLETEWITH MICROSCOPIC ([facility] ONLY) - Abnormal; Notable for the following:    Color, Urine AMBER (*)    APPearance CLOUDY (*)    Ketones, ur TRACE (*)    Hgb urine dipstick 2+ (*)    Protein, ur 100 (*)    Bacteria, UA FEW (*)    Squamous Epithelial / LPF 6-30 (*)    All other components within normal limits  TROPONIN I  COMPREHENSIVE METABOLIC PANEL  CBC WITH DIFFERENTIAL/PLATELET  LIPASE, BLOOD  POC URINE PREG, ED  POCT PREGNANCY, URINE   ____________________________________________  EKG  None ____________________________________________  RADIOLOGY  Ultrasound abdomen limited interpreted per Dr. Phill Myron: Cholelithiasis with positive sonographic Murphy sign. No other sonographic evidence for acute cholecystitis such as gallbladder wall thickening or  free pericholecystic fluid. No biliary dilatation. ____________________________________________   PROCEDURES  Procedure(s) performed: None  Critical Care performed: No  ____________________________________________   INITIAL IMPRESSION / ASSESSMENT AND PLAN / ED COURSE  Pertinent labs & imaging results that were available during my care of the patient were reviewed by me and considered in my medical decision making (see chart for details).  39 year old female who presents with a 4 day history of upper abdominal pain worse with eating. Leukocytosis, mildly elevated LFTs and elevated lipase noted. Plan for IV analgesia and ultrasound to evaluate biliary tree.  ----------------------------------------- 1:40 AM on 11/17/2014 -----------------------------------------  Patient improved after morphine. Discussed case with Dr. Juliann Pulse who will evaluate patient in the ED. ____________________________________________   FINAL CLINICAL IMPRESSION(S) / ED DIAGNOSES  Final diagnoses:  Epigastric pain  Acute pancreatitis, unspecified pancreatitis type  Calculus of gallbladder without cholecystitis without obstruction      Irean Hong, MD 11/17/14 [phone]

## 2014-11-17 ENCOUNTER — Encounter
Admission: EM | Disposition: A | Discharge status: Home / Self Care | Payer: Self-pay | Source: Home / Self Care | Attending: Emergency Medicine

## 2014-11-17 ENCOUNTER — Observation Stay: Admitting: Anesthesiology

## 2014-11-17 ENCOUNTER — Observation Stay

## 2014-11-17 ENCOUNTER — Encounter: Payer: Self-pay | Admitting: Emergency Medicine

## 2014-11-17 DIAGNOSIS — K851 Biliary acute pancreatitis without necrosis or infection: Secondary | ICD-10-CM | POA: Diagnosis present

## 2014-11-17 HISTORY — PX: CHOLECYSTECTOMY: SHX55

## 2014-11-17 LAB — CBC WITH DIFFERENTIAL/PLATELET
BASOS PCT: 1 %
Basophils Absolute: 0.1 10*3/uL (ref 0–0.1)
Eosinophils Absolute: 0.4 10*3/uL (ref 0–0.7)
Eosinophils Relative: 3 %
HEMATOCRIT: 37.8 % (ref 35.0–47.0)
Hemoglobin: 12.5 g/dL (ref 12.0–16.0)
LYMPHS PCT: 25 %
Lymphs Abs: 3.7 10*3/uL — ABNORMAL HIGH (ref 1.0–3.6)
MCH: 30.5 pg (ref 26.0–34.0)
MCHC: 33.1 g/dL (ref 32.0–36.0)
MCV: 92 fL (ref 80.0–100.0)
MONO ABS: 1 10*3/uL — AB (ref 0.2–0.9)
Monocytes Relative: 7 %
NEUTROS PCT: 64 %
Neutro Abs: 9.6 10*3/uL — ABNORMAL HIGH (ref 1.4–6.5)
PLATELETS: 274 10*3/uL (ref 150–440)
RBC: 4.11 MIL/uL (ref 3.80–5.20)
RDW: 14.3 % (ref 11.5–14.5)
WBC: 14.8 10*3/uL — ABNORMAL HIGH (ref 3.6–11.0)

## 2014-11-17 LAB — COMPREHENSIVE METABOLIC PANEL
ALK PHOS: 139 U/L — AB (ref 38–126)
ALT: 182 U/L — AB (ref 14–54)
AST: 54 U/L — ABNORMAL HIGH (ref 15–41)
Albumin: 3.5 g/dL (ref 3.5–5.0)
Anion gap: 9 (ref 5–15)
BILIRUBIN TOTAL: 0.9 mg/dL (ref 0.3–1.2)
BUN: 12 mg/dL (ref 6–20)
CO2: 24 mmol/L (ref 22–32)
Calcium: 8.7 mg/dL — ABNORMAL LOW (ref 8.9–10.3)
Chloride: 104 mmol/L (ref 101–111)
Creatinine, Ser: 0.71 mg/dL (ref 0.44–1.00)
GFR calc non Af Amer: >60 mL/min (ref >60)
GLUCOSE: 91 mg/dL (ref 65–99)
Potassium: 3.5 mmol/L (ref 3.5–5.1)
Sodium: 137 mmol/L (ref 135–145)
Total Protein: 6.8 g/dL (ref 6.5–8.1)

## 2014-11-17 LAB — LIPASE, BLOOD: Lipase: 148 U/L — ABNORMAL HIGH (ref 22–51)

## 2014-11-17 SURGERY — LAPAROSCOPIC CHOLECYSTECTOMY WITH INTRAOPERATIVE CHOLANGIOGRAM
Anesthesia: General | Site: Abdomen | Wound class: Clean Contaminated

## 2014-11-17 MED ORDER — ONDANSETRON HCL 4 MG/2ML IJ SOLN
4.0000 mg | Freq: Once | INTRAMUSCULAR | Status: AC | PRN
  Administered 2014-11-17: 4 mg via INTRAVENOUS

## 2014-11-17 MED ORDER — HYDROMORPHONE HCL 1 MG/ML IJ SOLN
0.5000 mg | INTRAMUSCULAR | Status: DC | PRN
  Administered 2014-11-17 (×2): 0.5 mg via INTRAVENOUS

## 2014-11-17 MED ORDER — LACTATED RINGERS IV SOLN
INTRAVENOUS | Status: DC
  Administered 2014-11-17 (×3): via INTRAVENOUS

## 2014-11-17 MED ORDER — HEPARIN SODIUM (PORCINE) 5000 UNIT/ML IJ SOLN
5000.0000 [IU] | Freq: Three times a day (TID) | INTRAMUSCULAR | Status: DC
  Administered 2014-11-17 – 2014-11-19 (×6): 5000 [IU] via SUBCUTANEOUS
  Filled 2014-11-17 (×6): qty 1

## 2014-11-17 MED ORDER — SUGAMMADEX SODIUM 200 MG/2ML IV SOLN
INTRAVENOUS | Status: DC | PRN
  Administered 2014-11-17: 309.4 mg via INTRAVENOUS

## 2014-11-17 MED ORDER — FENTANYL CITRATE (PF) 100 MCG/2ML IJ SOLN
INTRAMUSCULAR | Status: DC | PRN
  Administered 2014-11-17: 50 ug via INTRAVENOUS
  Administered 2014-11-17 (×2): 100 ug via INTRAVENOUS

## 2014-11-17 MED ORDER — SODIUM CHLORIDE 0.9 % IV SOLN
INTRAVENOUS | Status: DC
  Administered 2014-11-17 (×2): via INTRAVENOUS

## 2014-11-17 MED ORDER — PROPOFOL 10 MG/ML IV BOLUS
INTRAVENOUS | Status: DC | PRN
  Administered 2014-11-17: 200 mg via INTRAVENOUS

## 2014-11-17 MED ORDER — SODIUM CHLORIDE 0.9 % IR SOLN
Status: DC | PRN
  Administered 2014-11-17: 1000 mL

## 2014-11-17 MED ORDER — MIDAZOLAM HCL 2 MG/2ML IJ SOLN
INTRAMUSCULAR | Status: DC | PRN
  Administered 2014-11-17: 2 mg via INTRAVENOUS

## 2014-11-17 MED ORDER — HYDROMORPHONE HCL 1 MG/ML IJ SOLN
0.5000 mg | INTRAMUSCULAR | Status: DC | PRN
  Administered 2014-11-17 (×2): 1 mg via INTRAVENOUS
  Filled 2014-11-17 (×2): qty 1

## 2014-11-17 MED ORDER — KETOROLAC TROMETHAMINE 30 MG/ML IJ SOLN
INTRAMUSCULAR | Status: DC | PRN
  Administered 2014-11-17: 30 mg via INTRAVENOUS

## 2014-11-17 MED ORDER — PANTOPRAZOLE SODIUM 40 MG IV SOLR
40.0000 mg | Freq: Every day | INTRAVENOUS | Status: DC
  Administered 2014-11-17 – 2014-11-18 (×2): 40 mg via INTRAVENOUS
  Filled 2014-11-17 (×2): qty 40

## 2014-11-17 MED ORDER — ROCURONIUM BROMIDE 100 MG/10ML IV SOLN
INTRAVENOUS | Status: DC | PRN
  Administered 2014-11-17: 40 mg via INTRAVENOUS
  Administered 2014-11-17: 10 mg via INTRAVENOUS

## 2014-11-17 MED ORDER — HYDROMORPHONE HCL 1 MG/ML IJ SOLN
1.0000 mg | INTRAMUSCULAR | Status: DC | PRN
  Administered 2014-11-17 – 2014-11-19 (×12): 1 mg via INTRAVENOUS
  Filled 2014-11-17 (×12): qty 1

## 2014-11-17 MED ORDER — ONDANSETRON HCL 4 MG/2ML IJ SOLN
4.0000 mg | Freq: Four times a day (QID) | INTRAMUSCULAR | Status: DC | PRN
  Administered 2014-11-17 – 2014-11-18 (×5): 4 mg via INTRAVENOUS
  Filled 2014-11-17 (×5): qty 2

## 2014-11-17 MED ORDER — FENTANYL CITRATE (PF) 100 MCG/2ML IJ SOLN
25.0000 ug | INTRAMUSCULAR | Status: AC | PRN
  Administered 2014-11-17 (×6): 25 ug via INTRAVENOUS

## 2014-11-17 MED ORDER — AMITRIPTYLINE HCL 50 MG PO TABS: 25.0000 mg | ORAL_TABLET | Freq: Every evening | ORAL | Status: DC | PRN

## 2014-11-17 MED ORDER — ONDANSETRON HCL 4 MG/2ML IJ SOLN
INTRAMUSCULAR | Status: AC
  Filled 2014-11-17: qty 2

## 2014-11-17 MED ORDER — CEFAZOLIN SODIUM-DEXTROSE 2-3 GM-% IV SOLR
2.0000 g | Freq: Once | INTRAVENOUS | Status: AC
  Administered 2014-11-17 (×2): 2 g via INTRAVENOUS
  Filled 2014-11-17: qty 50

## 2014-11-17 MED ORDER — MORPHINE SULFATE 4 MG/ML IJ SOLN
INTRAMUSCULAR | Status: AC
  Filled 2014-11-17: qty 1

## 2014-11-17 MED ORDER — BUPIVACAINE HCL 0.25 % IJ SOLN
INTRAMUSCULAR | Status: DC | PRN
  Administered 2014-11-17: 30 mL

## 2014-11-17 MED ORDER — SUCCINYLCHOLINE CHLORIDE 20 MG/ML IJ SOLN
INTRAMUSCULAR | Status: DC | PRN
  Administered 2014-11-17: 120 mg via INTRAVENOUS

## 2014-11-17 SURGICAL SUPPLY — 37 items
APPLIER CLIP ROT 10 11.4 M/L (STAPLE) ×2
BAG COUNTER SPONGE EZ (MISCELLANEOUS) ×2 IMPLANT
CANISTER SUCT 1200ML W/VALVE (MISCELLANEOUS) ×2 IMPLANT
CATH REDDICK CHOLANGI 4FR 50CM (CATHETERS) ×2 IMPLANT
CHLORAPREP W/TINT 26ML (MISCELLANEOUS) ×2 IMPLANT
CLIP APPLIE ROT 10 11.4 M/L (STAPLE) ×1 IMPLANT
DRAIN CHANNEL JP 19F (MISCELLANEOUS) ×2 IMPLANT
DRAPE SHEET LG 3/4 BI-LAMINATE (DRAPES) ×2 IMPLANT
DRSG TEGADERM 2-3/8X2-3/4 SM (GAUZE/BANDAGES/DRESSINGS) ×8 IMPLANT
DRSG TELFA 3X8 NADH (GAUZE/BANDAGES/DRESSINGS) ×2 IMPLANT
GLOVE BIO SURGEON STRL SZ7.5 (GLOVE) ×2 IMPLANT
GLOVE INDICATOR 8.0 STRL GRN (GLOVE) ×2 IMPLANT
GOWN STRL REUS W/ TWL LRG LVL3 (GOWN DISPOSABLE) ×1 IMPLANT
GOWN STRL REUS W/TWL LRG LVL3 (GOWN DISPOSABLE) ×1
GRASPER SUT TROCAR 14GX15 (MISCELLANEOUS) ×4 IMPLANT
IRRIGATION STRYKERFLOW (MISCELLANEOUS) ×1 IMPLANT
IRRIGATOR STRYKERFLOW (MISCELLANEOUS) ×2
NEEDLE 18GX1X1/2 (RX/OR ONLY) (NEEDLE) ×2 IMPLANT
NEEDLE HYPO 25X1 1.5 SAFETY (NEEDLE) ×2 IMPLANT
NEEDLE INSUFFLATION 14GA 120MM (NEEDLE) ×2 IMPLANT
PACK LAP CHOLECYSTECTOMY (MISCELLANEOUS) ×2 IMPLANT
PAD GROUND ADULT SPLIT (MISCELLANEOUS) ×2 IMPLANT
POUCH ENDO CATCH 10MM SPEC (MISCELLANEOUS) IMPLANT
SCISSORS METZENBAUM CVD 33 (INSTRUMENTS) ×2 IMPLANT
SEAL FOR SCOPE WARMER C3101 (MISCELLANEOUS) ×2 IMPLANT
SLEEVE ADV FIXATION 5X100MM (TROCAR) ×2 IMPLANT
STRAP SAFETY BODY (MISCELLANEOUS) ×2 IMPLANT
SUT ETHILON 5-0 (SUTURE) ×1
SUT ETHILON 5-0 C-3 18XMFL BLK (SUTURE) ×1
SUT VIC AB 0 CT2 27 (SUTURE) ×4 IMPLANT
SUTURE ETHLN 5-0 C3 18XMF BLK (SUTURE) ×1 IMPLANT
SYR 3ML LL SCALE MARK (SYRINGE) ×2 IMPLANT
TROCAR 12M 150ML BLUNT (TROCAR) ×2 IMPLANT
TROCAR Z-THREAD FIOS 11X100 BL (TROCAR) ×2 IMPLANT
TROCAR Z-THREAD OPTICAL 5X100M (TROCAR) ×2 IMPLANT
TROCAR Z-THREAD SLEEVE 11X100 (TROCAR) ×2 IMPLANT
TUBING INSUFFLATOR HI FLOW (MISCELLANEOUS) ×2 IMPLANT

## 2014-11-17 NOTE — Transfer of Care (Signed)
Immediate Anesthesia Transfer of Care Note  Patient: Andrea Hull  Procedure(s) Performed: Procedure(s): LAPAROSCOPIC CHOLECYSTECTOMY WITH INTRAOPERATIVE CHOLANGIOGRAM (N/A)  Patient Location: PACU  Anesthesia Type:General  Level of Consciousness: awake, alert  and oriented  Airway & Oxygen Therapy: Patient Spontanous Breathing and Patient connected to face mask oxygen  Post-op Assessment: Report given to RN and Post -op Vital signs reviewed and stable  Post vital signs: Reviewed and stable  Last Vitals:  Filed Vitals:   11/17/14 1547  BP: 158/70  Pulse: 94  Temp: 37 C  Resp: 16    Complications: No apparent anesthesia complications

## 2014-11-17 NOTE — Progress Notes (Signed)
Chart reviewed, patient examined. Laboratory work reviewed and ultrasound reviewed. This patient demonstrates symptomatic biliary tract disease. With a very large single stone in her gallbladder I doubt she has significant ductal obstruction. Because of her persistent symptoms and elevated white blood cell count we will recommend surgical intervention. I discussed risks benefits and options for laparoscopic cholecystectomy with the patient and she is in agreement.

## 2014-11-17 NOTE — H&P (Signed)
Surgery history and physical  CC: Epigastric pain  HPI:  39 yo F who presents with 4 days of epigastric pain.  Began acutely 4 days ago.  Worsening.  + nausea, no emesis.  No diarrhea.  Last PO was 14 hours ago.  No fevers/chills.  Colicky pain.  No unusual ingestions, no sick contacts.  No headache, chest pain, shortness of breath, cough, dysuria/hematuria.  Active Ambulatory Problems    Diagnosis Date Noted  . Sleep apnea, obstructive   . Morbid obesity with BMI of 50.0-59.9, adult 04/15/2013  . Tobacco abuse   . Hypertension   . Arthritis   . Viral URI 10/20/2014   Resolved Ambulatory Problems    Diagnosis Date Noted  . No Resolved Ambulatory Problems   No Additional Past Medical History   History   Social History  . Marital Status: Married    Spouse Name: N/A  . Number of Children: N/A  . Years of Education: N/A   Occupational History  . Not on file.   Social History Main Topics  . Smoking status: Current Every Day Smoker -- 1.00 packs/day    Types: Cigarettes  . Smokeless tobacco: Never Used  . Alcohol Use: Yes     Comment: rare  . Drug Use: No  . Sexual Activity: Not on file   Other Topics Concern  . Not on file   Social History Narrative   Family History  Problem Relation Age of Onset  . Arthritis Mother   . Alcohol abuse Father   . Arthritis Father   . Hyperlipidemia Father   . Hypertension Father   . Diabetes Father   . Personality disorder Sister   . Anxiety disorder Sister   . Hypertension Brother   . Diabetes Maternal Grandmother   . Lung cancer Maternal Grandfather   . Alcohol abuse Paternal Grandmother   . Breast cancer Paternal Grandmother   . Alcohol abuse Paternal Grandfather   . Arthritis Paternal Grandfather   . Heart disease Paternal Grandfather   . Hypertension Sister    Review of systems:  Full review of systems obtained, pertinent positives and negatives per HPI.  Blood pressure 152/74, pulse 85, temperature 98.1 F (36.7 C),  temperature source Oral, resp. rate 20, height 5\' 6"  (1.676 m), weight 154.677 kg (341 lb), last menstrual period 09/04/2014, SpO2 96 %.  GEN: NAD/A&Ox3, obese HEAD: normocephalic, atraumatic EYES: no scleral icterus, no conjunctivitis FACE: no obvious facial trauma, normal external ears and nose CV: RRR, no mrg RESP: moving air well, clear to auscultation ABD: soft, nondistended, tender RUQ> LUQ EXT: moving all ext well, strength 5/5 NEURO: CN II-XII grossly intact, sensation intact all 4 ext  Labs WBC 19.3, 67% neutrophils Lipase 184 (51 upper limit of normal)  US: + sonographic murphys sign, + large ?immobile gallstone, no pericholecystic fluid, no gb wall thickening  A/P  39 yo F with epigastric pain, mildly elevated lipase, large gallstone.  Favor gallstone pancreatitis vs symptomatic cholelithiasis.  Will admit for IVF, NPO, consent for lap cholecystectomy.  Will leave final decision for Dr. Michela PitcherEly.  Will obtain lipase in am to ensure pancreatitis resolved.

## 2014-11-17 NOTE — Anesthesia Postprocedure Evaluation (Signed)
  Anesthesia Post-op Note  Patient: Andrea Hull  Procedure(s) Performed: Procedure(s): LAPAROSCOPIC CHOLECYSTECTOMY WITH INTRAOPERATIVE CHOLANGIOGRAM (N/A)  Anesthesia type:General  Patient location: PACU  Post pain: Pain level controlled  Post assessment: Post-op Vital signs reviewed, Patient's Cardiovascular Status Stable, Respiratory Function Stable, Patent Airway and No signs of Nausea or vomiting  Post vital signs: Reviewed and stable  Last Vitals:  Filed Vitals:   11/17/14 1547  BP: 158/70  Pulse: 94  Temp: 37 C  Resp: 16    Level of consciousness: awake, alert  and patient cooperative  Complications: No apparent anesthesia complications

## 2014-11-17 NOTE — ED Notes (Signed)
Dr Lundquist at bedside. 

## 2014-11-17 NOTE — Progress Notes (Signed)
11/16/2014 - 11/17/2014  3:47 PM  PATIENT:  Andrea Hull  39 y.o. female  PRE-OPERATIVE DIAGNOSIS:  cholelithiasis  POST-OPERATIVE DIAGNOSIS:  cholelithiasis  PROCEDURE:  Laparoscopic cholecystectomy  SURGEON:  Surgeon(s) and Role:    * Tiney Rougealph Ely III, MD - Primary   ASSISTANTS: none   ANESTHESIA:   general  EBL:  Total I/O In: 600 [I.V.:600] Out: 300 [Urine:275; Blood:25]   DRAINS: (Subhepatic space) Jackson-Pratt drain(s) with closed bulb suction in the Subhepatic space   LOCAL MEDICATIONS USED:  BUPIVICAINE    DISPOSITION OF SPECIMEN:  PATHOLOGY   DICTATION: .Dragon Dictation with the patient supine position at which appropriate general anesthesia the patient Was prepped ChloraPrep and draped sterile towels. The patient placed in the headdown feet up position. Small infraumbilical incision was made standard fashion carried down bluntly through subcutaneous tissue. There is noPeritoneal cavity. CO2 was insufflated to appropriate pressure measurements. When approximately 200 L of CO2 were instilled degrees needles withdrawn and a 12 mm bariatric port inserted without difficulty. Intraperitoneal position was confirmed and CO2 was reinsufflated. The patient's placed in head up feet down position rotated slightly to the left side. Subxiphoid transverse incision was made and a 5 mm port inserted under direct vision. 2 lateral ports 5 mm in size were inserted under direct vision.  The gallbladder was distended and tense edematous and discolored. The gallbladder was grasped superiorly and laterally exposing the hepatoduodenal ligament. Cystic artery and cystic duct were identified. Cystic artery was doubly clipped first and then divided cystic duct was doubly clipped and divided.  The gallbladder was then dissected free from its bed in the liver using the cautery apparatus. Once the gallbladder was free it was removed through this subxiphoid incision. The area was then copiously  irrigated. Because of the size of the liver and the difficulty in dissection a 19 [place] Blake drain was inserted into the subhepatic space and brought out through the umbilical stab wounds. The upper midline incision was closed with a figure-of-eight suture of 0 Vicryl using the suture passer. The abdomen was then desufflated. Skin incisions were closed with 5-0 nylon. The drain was secured with 3-0 nylon. The area was infiltrated with 0.5% Marcaine for postoperative pain control. Sterile dressings were applied. Patient returned recovery room having tolerated procedure well. Sponge instrument needle count correct 2 in the operative.  PLAN OF CARE: Admit for overnight observation  PATIENT DISPOSITION:  PACU - hemodynamically stable.   Tiney Rougealph Ely III, MD

## 2014-11-17 NOTE — Progress Notes (Signed)
Initial Nutrition Assessment  DOCUMENTATION CODES:     INTERVENTION:     NUTRITION DIAGNOSIS:  Inadequate oral intake related to inability to eat as evidenced by NPO status.  GOAL:   (Goal for diet advancement and tolerance as medically able within 5-7 days)  MONITOR:   (Energy Intake, Electrolyte and renal Profile, Glucose Profile, Gastrointestinal Profile)  REASON FOR ASSESSMENT:   (RD Screen, Diagnosis)    ASSESSMENT:  Pt admitted with abdominal pain secondary to acute gallstone pancreatitis. Per MD note, plan for lap chole.  PMHx: Past Medical History  Diagnosis Date  . Arthritis   . Hypertension   . Sleep apnea, obstructive   . Morbid obesity with BMI of 50.0-59.9, adult   . Tobacco abuse    PO Intake: pt reports eating very small amounts with poor appetite secondary to abdominal pain and nausea since this past Friday (4 days ago). Pt reports recently starting to monitor her kcals and energy intake until abdominal pain started this weekend. Pt currently NPO.  Medications: NS at 145m/hr, Protonix Labs:  Electrolyte and Renal Profile:    Recent Labs Lab 11/16/14 2238 11/17/14 0530  BUN 14 12  CREATININE 0.81 0.71  NA 137 137  K 3.9 3.5   Glucose Profile: No results for input(s): GLUCAP in the last 72 hours. Protein Profile:   Recent Labs Lab 11/16/14 2238 11/17/14 0530  ALBUMIN 3.9 3.5   Lipase     Component Value Date/Time   LIPASE 148* 11/17/2014 0530    Hepatic Function Latest Ref Rng 11/17/2014 11/16/2014  Total Protein 6.5 - 8.1 g/dL 6.8 8.0  Albumin 3.5 - 5.0 g/dL 3.5 3.9  AST 15 - 41 U/L 54(H) 70(H)  ALT 14 - 54 U/L 182(H) 227(H)  Alk Phosphatase 38 - 126 U/L 139(H) 164(H)  Total Bilirubin 0.3 - 1.2 mg/dL 0.9 0.8    Pt reports stable weight recently.  Height:  Ht Readings from Last 1 Encounters:  11/17/14 5' 6"  (1.676 m)    Weight:  Wt Readings from Last 1 Encounters:  11/17/14 341 lb (154.677 kg)    Ideal Body  Weight:  59 kg  Wt Readings from Last 10 Encounters:  11/17/14 341 lb (154.677 kg)  10/20/14 350 lb (158.759 kg)  06/29/14 349 lb 4 oz (158.419 kg)  05/07/13 324 lb 8 oz (147.192 kg)  04/14/13 322 lb (146.058 kg)    BMI:  Body mass index is 55.07 kg/(m^2).  Skin:  Reviewed, no issues  Diet Order:  Diet NPO time specified  EDUCATION NEEDS:  Education needs no appropriate at this time   Intake/Output Summary (Last 24 hours) at 11/17/14 1104 Last data filed at 11/17/14 1000  Gross per 24 hour  Intake    160 ml  Output    275 ml  Net   -115 ml    Last BM:  5/30  LOW Care Level  ADwyane Luo RD, LDN Pager ([phone]

## 2014-11-17 NOTE — Anesthesia Preprocedure Evaluation (Signed)
Anesthesia Evaluation  Patient identified by MRN, date of birth, ID band Patient awake    Reviewed: Allergy & Precautions, NPO status , Patient's Chart, lab work & pertinent test results  History of Anesthesia Complications Negative for: history of anesthetic complications  Airway Mallampati: III  TM Distance: >3 FB Neck ROM: Full    Dental no notable dental hx.    Pulmonary sleep apnea and Continuous Positive Airway Pressure Ventilation , Current Smoker,  somkes 1ppd breath sounds clear to auscultation  Pulmonary exam normal       Cardiovascular hypertension, negative cardio ROS Normal cardiovascular examRhythm:Regular Rate:Normal     Neuro/Psych negative neurological ROS  negative psych ROS   GI/Hepatic Neg liver ROS, GERD-  Medicated,  Endo/Other  Morbid obesity  Renal/GU negative Renal ROS  negative genitourinary   Musculoskeletal  (+) Arthritis -,   Abdominal   Peds negative pediatric ROS (+)  Hematology negative hematology ROS (+)   Anesthesia Other Findings   Reproductive/Obstetrics negative OB ROS                             Anesthesia Physical Anesthesia Plan  ASA: III  Anesthesia Plan: General   Post-op Pain Management:    Induction: Intravenous  Airway Management Planned: Oral ETT  Additional Equipment:   Intra-op Plan:   Post-operative Plan: Extubation in OR  Informed Consent: I have reviewed the patients History and Physical, chart, labs and discussed the procedure including the risks, benefits and alternatives for the proposed anesthesia with the patient or authorized representative who has indicated his/her understanding and acceptance.   Dental advisory given  Plan Discussed with: CRNA and Surgeon  Anesthesia Plan Comments:         Anesthesia Quick Evaluation

## 2014-11-17 NOTE — Progress Notes (Signed)
Per Dr. Juliann PulseLundquist pt can use CPAP from home.

## 2014-11-17 NOTE — Anesthesia Procedure Notes (Signed)
Procedure Name: Intubation Date/Time: 11/17/2014 2:16 PM Performed by: Omer JackWEATHERLY, Andrea Dickison Pre-anesthesia Checklist: Patient identified, Emergency Drugs available, Suction available, Patient being monitored and Timeout performed Patient Re-evaluated:Patient Re-evaluated prior to inductionOxygen Delivery Method: Circle system utilized Preoxygenation: Pre-oxygenation with 100% oxygen Intubation Type: IV induction, Rapid sequence and Cricoid Pressure applied Ventilation: Mask ventilation without difficulty Laryngoscope Size: McGraph and 3 Grade View: Grade I Tube type: Oral Tube size: 7.0 mm Number of attempts: 1 Placement Confirmation: ETT inserted through vocal cords under direct vision,  positive ETCO2 and breath sounds checked- equal and bilateral Secured at: 21 cm Tube secured with: Tape Dental Injury: Teeth and Oropharynx as per pre-operative assessment

## 2014-11-18 ENCOUNTER — Ambulatory Visit: Admitting: Family Medicine

## 2014-11-18 NOTE — Progress Notes (Signed)
1 Day Post-Op   Subjective: She is resting comfortably with mild complaints of upper abdominal pain. She still taking liquids. She had some mild nausea last night but has not vomited. She is using her CPAP device for sleeping.  Vital signs in last 24 hours: Temp:  [97.7 F (36.5 C)-98.8 F (37.1 C)] 98.8 F (37.1 C) (06/01 0653) Pulse Rate:  [61-114] 88 (06/01 0653) Resp:  [15-20] 17 (06/01 0653) BP: (122-158)/(54-78) 128/59 mmHg (06/01 0653) SpO2:  [91 %-100 %] 92 % (06/01 0653) Weight:  [154.677 kg (341 lb)] 154.677 kg (341 lb) (05/31 1307) Last BM Date: 11/16/14  Intake/Output from previous day: 05/31 0701 - 06/01 0700 In: 2854 [P.O.:120; I.V.:2644] Out: 1110 [Urine:975; Drains:110; Blood:25]  GI: She does not have any significant abdominal tenderness other than some mild incisional tenderness. She has active bowel sounds.  Lab Results:  CBC  Recent Labs  11/16/14 2238 11/17/14 0530  WBC 19.3* 14.8*  HGB 14.1 12.5  HCT 42.4 37.8  PLT 311 274   CMP     Component Value Date/Time   NA 137 11/17/2014 0530   K 3.5 11/17/2014 0530   CL 104 11/17/2014 0530   CO2 24 11/17/2014 0530   GLUCOSE 91 11/17/2014 0530   BUN 12 11/17/2014 0530   CREATININE 0.71 11/17/2014 0530   CALCIUM 8.7* 11/17/2014 0530   PROT 6.8 11/17/2014 0530   ALBUMIN 3.5 11/17/2014 0530   AST 54* 11/17/2014 0530   ALT 182* 11/17/2014 0530   ALKPHOS 139* 11/17/2014 0530   BILITOT 0.9 11/17/2014 0530   GFRNONAA >60 11/17/2014 0530   GFRAA >60 11/17/2014 0530   PT/INR No results for input(s): LABPROT, INR in the last 72 hours.  Studies/Results: [place] Abdomen Limited Ruq  11/17/2014   CLINICAL DATA:  Initial evaluation for acute right upper quadrant pain for 3 days.  EXAM: US ABDOMEN LIMITED - RIGHT UPPER QUADRANT  COMPARISON:  None.  FINDINGS: Gallbladder:  Large approximately 2.2 cm shadowing echogenic gallstone present within the gallbladder lumen. Minimal movement with patient positioning. No  free pericholecystic fluid. No gallbladder wall thickening. Positive sonographic Murphy sign elicited on exam.  Common bile duct:  Diameter: 4.1 mm  Liver:  No focal lesion identified. Within normal limits in parenchymal echogenicity.  IMPRESSION: Cholelithiasis with positive sonographic Murphy sign. No other sonographic evidence for acute cholecystitis such as gallbladder wall thickening or free pericholecystic fluid. No biliary dilatation.   Electronically Signed   By: Rise MuBenjamin  McClintock M.D.   On: 11/17/2014 01:31    Assessment/Plan: She is improving overall. We'll advance her diet and activity level today. We'll plan to remove her drain tomorrow and anticipate discharge tomorrow.

## 2014-11-19 LAB — SURGICAL PATHOLOGY

## 2014-11-19 MED ORDER — HYDROCODONE-ACETAMINOPHEN 5-325 MG PO TABS: 1.0000 | ORAL_TABLET | Freq: Four times a day (QID) | ORAL | Status: DC | PRN

## 2014-11-19 MED ORDER — HYDROCODONE-ACETAMINOPHEN 5-325 MG PO TABS
1.0000 | ORAL_TABLET | Freq: Four times a day (QID) | ORAL | Status: DC | PRN
  Administered 2014-11-19 (×2): 1 via ORAL
  Filled 2014-11-19 (×2): qty 1

## 2014-11-19 NOTE — Progress Notes (Signed)
Pt discharged to home. IV site removed. Concerns addressed. JP drain removed by MD. Discharge summaries given to patient.

## 2014-11-19 NOTE — Progress Notes (Signed)
Prescription given to patient. Pt discharged via wheelchair with auxillary.

## 2014-11-19 NOTE — Op Note (Signed)
11/16/2014 - 11/17/2014  1:46 PM  PATIENT:  Andrea Hull  39 y.o. female  PRE-OPERATIVE DIAGNOSIS:  cholelithiasis  POST-OPERATIVE DIAGNOSIS:  cholelithiasis  PROCEDURE:  Procedure(s): LAPAROSCOPIC CHOLECYSTECTOMY WITH INTRAOPERATIVE CHOLANGIOGRAM (N/A)  SURGEON:  Surgeon(s) and Role:    * Salley Hewsalph Ely III, MD - Primary  PHYSICIAN ASSISTANT:   ASSISTANTS: none   ANESTHESIA:   general  EBL:  Total I/O In: 480 [P.O.:480] Out: 20 [Drains:20]  BLOOD ADMINISTERED:none  DRAINS: (1) Blake drain(s) in the Subhepatic space   LOCAL MEDICATIONS USED:  BUPIVICAINE   SPECIMEN:  No Specimen  DISPOSITION OF SPECIMEN:  PATHOLOGY  COUNTS:  YES  TOURNIQUET:  * No tourniquets in log *  DICTATION: .Dragon Dictation with the patient supine position infection appropriate general anesthesia the patient's abdomen was prepped with ChloraPrep and draped sterile towels. The patient was placed in headdown feet up position. Small infraumbilical incision was made standard fashion carried down bluntly through the subcutaneous tissue. A varies needle was used to cannulate peritoneal cavity. CO2 was insufflated to appropriate pressure measurements. When approximately 2 and half liters of CO2 were instilled a varies needle was withdrawn and an 11 mm port placed without difficulty. Intraperitoneal position was confirmed and CO2 was reinsufflated. The patient's placed in head up feet down position rotated slightly to the left side.  Subxiphoid incision was made and another 11 mm port inserted under direct vision. 2 lateral ports 5 mm in size were inserted under direct fashion. The gallbladder was retracted superiorly and laterally exposing the hepatoduodenal ligament. Dissection was carried out along the ligament exposing the cystic artery and cystic duct. Cystic artery was clipped first and divided. Cystic duct was quite short followed into the gallbladder doubly clipped and divided.  The gallbladder was then  dissected free from its bed in the liver with the hook and  cautery apparatus. Once the gallbladder was free, it was removed through the upper midline incision. The area was copiously irrigated with warm saline solution. A Blake drain was placed in the bed of the liver brought out through one of the separate stab wounds. The upper midline fascia was closed with a figure-of-eight suture of 0 Vicryl 2 using the suture passer.  The abdomen was then desufflated and all ports removed without difficulty. Skin incisions were closed with 5-0 nylon. The drain was secured with 3-0 nylon. The area was infiltrated with 0.25% Marcaine for postoperative pain control. The seeds were applied. The patient was returned recovery room having tolerated procedure well. Sponge instrument needle count were correct 2 in the operating room.  PLAN OF CARE: Admit for overnight observation  PATIENT DISPOSITION:  PACU - hemodynamically stable.   Delay start of Pharmacological VTE agent (>24hrs) due to surgical blood loss or risk of bleeding: not applicable

## 2014-11-19 NOTE — Progress Notes (Signed)
2 Days Post-Op   Subjective:   Vital signs in last 24 hours: Temp:  [98.1 F (36.7 C)-98.4 F (36.9 C)] 98.1 F (36.7 C) (06/02 0826) Pulse Rate:  [86-92] 92 (06/02 0826) Resp:  [16-20] 16 (06/02 0826) BP: (135-157)/(69-82) 156/82 mmHg (06/02 0826) SpO2:  [93 %-97 %] 93 % (06/02 0826) Last BM Date: 11/16/14  Intake/Output from previous day: 06/01 0701 - 06/02 0700 In: 2090.7 [P.O.:920; I.V.:1120.7] Out: 50 [Drains:50]  GI: Her abdomen is soft with minimal abdominal tenderness. The wounds look good with no significant drainage. Her JP drain was removed.  Lab Results:  CBC  Recent Labs  11/16/14 2238 11/17/14 0530  WBC 19.3* 14.8*  HGB 14.1 12.5  HCT 42.4 37.8  PLT 311 274   CMP     Component Value Date/Time   NA 137 11/17/2014 0530   K 3.5 11/17/2014 0530   CL 104 11/17/2014 0530   CO2 24 11/17/2014 0530   GLUCOSE 91 11/17/2014 0530   BUN 12 11/17/2014 0530   CREATININE 0.71 11/17/2014 0530   CALCIUM 8.7* 11/17/2014 0530   PROT 6.8 11/17/2014 0530   ALBUMIN 3.5 11/17/2014 0530   AST 54* 11/17/2014 0530   ALT 182* 11/17/2014 0530   ALKPHOS 139* 11/17/2014 0530   BILITOT 0.9 11/17/2014 0530   GFRNONAA >60 11/17/2014 0530   GFRAA >60 11/17/2014 0530   PT/INR No results for input(s): LABPROT, INR in the last 72 hours.  Studies/Results: No results found.  Assessment/Plan: Overall she is doing quite well. Pain control remains the issue. We will increase her activity and see if we can get her switched to by mouth medication. When she is able to tolerate by mouth pain meds we will discharge her. I discussed this plan with the patient and her family.

## 2014-11-19 NOTE — Discharge Instructions (Signed)
Laparoscopic Cholecystectomy, Care After   These instructions give you information on caring for yourself after your procedure. Your doctor may also give you more specific instructions. Call your doctor if you have any problems or questions after your procedure.  HOME CARE  Change your bandages (dressings) as told by your doctor.  Keep the wound dry and clean. Wash the wound gently with soap and water. Pat the wound dry with a clean towel.  Do not take baths, swim, or use hot tubs for 2 weeks, or as told by your doctor.  Only take medicine as told by your doctor.  Eat a normal diet as told by your doctor.  Do not lift anything heavier than 10 pounds (4.5 kg) until your doctor says it is okay.  Do not play contact sports for 1 week, or as told by your doctor. GET HELP IF:  Your wound is red, puffy (swollen), or painful.  You have yellowish-white fluid (pus) coming from the wound.  You have fluid draining from the wound for more than 1 day.  You have a bad smell coming from the wound.  Your wound breaks open. GET HELP RIGHT AWAY IF:  You have trouble breathing.  You have chest pain.  You have a fever >101  You have pain in the shoulders (shoulder strap areas) that is getting worse.  You feel dizzy or pass out (faint).  You have severe belly (abdominal) pain.  You feel sick to your stomach (nauseous) or throw up (vomit) for more than 1 day. Laparoscopic Cholecystectomy, Care After   These instructions give you information on caring for yourself after your procedure. Your doctor may also give you more specific instructions. Call your doctor if you have any problems or questions after your procedure.  HOME CARE  Change your bandages (dressings) as told by your doctor.  Keep the wound dry and clean. Wash the wound gently with soap and water. Pat the wound dry with a clean towel.  Do not take baths, swim, or use hot tubs for 2 weeks, or as told by your doctor.  Only take medicine as told by  your doctor.  Eat a normal diet as told by your doctor.  Do not lift anything heavier than 10 pounds (4.5 kg) until your doctor says it is okay.  Do not play contact sports for 1 week, or as told by your doctor. GET HELP IF:  Your wound is red, puffy (swollen), or painful.  You have yellowish-white fluid (pus) coming from the wound.  You have fluid draining from the wound for more than 1 day.  You have a bad smell coming from the wound.  Your wound breaks open. GET HELP RIGHT AWAY IF:  You have trouble breathing.  You have chest pain.  You have a fever >101  You have pain in the shoulders (shoulder strap areas) that is getting worse.  You feel dizzy or pass out (faint).  You have severe belly (abdominal) pain.  You feel sick to your stomach (nauseous) or throw up (vomit) for more than 1 day.

## 2014-11-20 ENCOUNTER — Other Ambulatory Visit (INDEPENDENT_AMBULATORY_CARE_PROVIDER_SITE_OTHER)

## 2014-11-20 DIAGNOSIS — E785 Hyperlipidemia, unspecified: Secondary | ICD-10-CM

## 2014-11-20 DIAGNOSIS — R5383 Other fatigue: Secondary | ICD-10-CM | POA: Diagnosis not present

## 2014-11-20 LAB — BASIC METABOLIC PANEL
BUN: 9 mg/dL (ref 6–23)
CALCIUM: 9.4 mg/dL (ref 8.4–10.5)
CHLORIDE: 99 meq/L (ref 96–112)
CO2: 28 mEq/L (ref 19–32)
Creatinine, Ser: 0.77 mg/dL (ref 0.40–1.20)
GFR: 88.71 mL/min (ref >60.00)
Glucose, Bld: 101 mg/dL — ABNORMAL HIGH (ref 70–99)
POTASSIUM: 4.2 meq/L (ref 3.5–5.1)
SODIUM: 135 meq/L (ref 135–145)

## 2014-11-20 LAB — CBC WITH DIFFERENTIAL/PLATELET
BASOS ABS: 0.1 10*3/uL (ref 0.0–0.1)
Basophils Relative: 0.5 % (ref 0.0–3.0)
EOS ABS: 0.4 10*3/uL (ref 0.0–0.7)
EOS PCT: 2.8 % (ref 0.0–5.0)
HEMATOCRIT: 38.6 % (ref 36.0–46.0)
Hemoglobin: 12.9 g/dL (ref 12.0–15.0)
LYMPHS ABS: 3.3 10*3/uL (ref 0.7–4.0)
Lymphocytes Relative: 23.9 % (ref 12.0–46.0)
MCHC: 33.3 g/dL (ref 30.0–36.0)
MCV: 91.8 fl (ref 78.0–100.0)
MONOS PCT: 5.6 % (ref 3.0–12.0)
Monocytes Absolute: 0.8 10*3/uL (ref 0.1–1.0)
Neutro Abs: 9.2 10*3/uL — ABNORMAL HIGH (ref 1.4–7.7)
Neutrophils Relative %: 67.2 % (ref 43.0–77.0)
PLATELETS: 369 10*3/uL (ref 150.0–400.0)
RBC: 4.21 Mil/uL (ref 3.87–5.11)
RDW: 14.6 % (ref 11.5–15.5)
WBC: 13.7 10*3/uL — ABNORMAL HIGH (ref 4.0–10.5)

## 2014-11-20 LAB — HEPATIC FUNCTION PANEL
ALBUMIN: 3.9 g/dL (ref 3.5–5.2)
ALK PHOS: 149 U/L — AB (ref 39–117)
ALT: 160 U/L — ABNORMAL HIGH (ref 0–35)
AST: 76 U/L — AB (ref 0–37)
BILIRUBIN DIRECT: 0.2 mg/dL (ref 0.0–0.3)
TOTAL PROTEIN: 6.9 g/dL (ref 6.0–8.3)
Total Bilirubin: 0.7 mg/dL (ref 0.2–1.2)

## 2014-11-20 LAB — LIPID PANEL
CHOL/HDL RATIO: 7
Cholesterol: 229 mg/dL — ABNORMAL HIGH (ref 0–200)
HDL: 32.6 mg/dL — AB (ref >39.00)
LDL CALC: 164 mg/dL — AB (ref 0–99)
NonHDL: 196.4
Triglycerides: 164 mg/dL — ABNORMAL HIGH (ref 0.0–149.0)
VLDL: 32.8 mg/dL (ref 0.0–40.0)

## 2014-11-20 LAB — TSH: TSH: 1.9 u[IU]/mL (ref 0.35–4.50)

## 2014-11-26 ENCOUNTER — Ambulatory Visit (INDEPENDENT_AMBULATORY_CARE_PROVIDER_SITE_OTHER): Admitting: Surgery

## 2014-11-26 ENCOUNTER — Encounter: Payer: Self-pay | Admitting: Surgery

## 2014-11-26 ENCOUNTER — Ambulatory Visit (INDEPENDENT_AMBULATORY_CARE_PROVIDER_SITE_OTHER): Admitting: Family Medicine

## 2014-11-26 ENCOUNTER — Encounter: Payer: Self-pay | Admitting: Family Medicine

## 2014-11-26 VITALS — BP 168/99 | HR 87 | Temp 97.9°F | Ht 66.0 in | Wt 323.0 lb

## 2014-11-26 VITALS — BP 138/80 | HR 88 | Temp 98.0°F | Ht 66.0 in | Wt 342.5 lb

## 2014-11-26 DIAGNOSIS — Z0000 Encounter for general adult medical examination without abnormal findings: Secondary | ICD-10-CM | POA: Diagnosis not present

## 2014-11-26 DIAGNOSIS — G4733 Obstructive sleep apnea (adult) (pediatric): Secondary | ICD-10-CM

## 2014-11-26 DIAGNOSIS — K8000 Calculus of gallbladder with acute cholecystitis without obstruction: Secondary | ICD-10-CM

## 2014-11-26 DIAGNOSIS — Z720 Tobacco use: Secondary | ICD-10-CM | POA: Diagnosis not present

## 2014-11-26 NOTE — Patient Instructions (Signed)
Follow-up as needed. Please call our office with any questions or concerns. 

## 2014-11-26 NOTE — Progress Notes (Signed)
Dr. Frederico Hamman T. Janeese Mcgloin, MD, [facility] [place], [address] Phone: [phone] Fax: [phone]  11/26/2014  Patient: Andrea Hull, MRN: [id number], DOB: 1976/03/12, 39 y.o.  Primary Physician:  Owens Loffler, MD  Chief Complaint: Annual Exam  Subjective:   Andrea Hull is a 39 y.o. pleasant patient who presents with the following:  Health Maintenance Summary Reviewed and updated, unless pt declines services.  Tobacco History Reviewed. Smoker, precontemplative. Alcohol: No concerns, no excessive use Exercise Habits: NO activity, rec at least 30 mins 5 times a week STD concerns: none Drug Use: None Menses regular: yes Lumps or breast concerns: no Breast Cancer Family History: pat gm Has gyn  Gallbladder taken out last week.   Wt Readings from Last 3 Encounters:  11/26/14 342 lb 8 oz (155.357 kg)  11/17/14 341 lb (154.677 kg)  10/20/14 350 lb (158.759 kg)    Body mass index is 55.31 kg/(m^2).   BP Readings from Last 3 Encounters:  11/26/14 138/80  11/19/14 161/92  10/20/14 142/84    Hypertension, brings records from home, consistently elevated. Now post-op BMI 55  OSA: has had CPAP since 2007. Has not had a repeat sleep study.   Knee issues evrey now and then. Swelling with sodium. For 2 months.  Pain and difficulty with walking.   Smoking. Continues 1 PPD.    Health Maintenance  Topic Date Due  . HIV Screening  11/26/1990  . TETANUS/TDAP  11/26/1994  . INFLUENZA VACCINE  01/18/2015    Immunization History  Administered Date(s) Administered  . Influenza,inj,Quad PF,36+ Mos 04/14/2013, 03/27/2014   Patient Active Problem List   Diagnosis Date Noted  . Acute gallstone pancreatitis 11/17/2014  . Morbid obesity with BMI of 50.0-59.9, adult 04/15/2013  . Sleep apnea, obstructive   . Tobacco abuse   . Hypertension   . Arthritis    Past Medical History  Diagnosis Date  . Arthritis     . Hypertension   . Sleep apnea, obstructive   . Morbid obesity with BMI of 50.0-59.9, adult   . Tobacco abuse    Past Surgical History  Procedure Laterality Date  . Cesarean section    . Breast biospy  2013  . Miscarriage  2008  . Cholecystectomy N/A 11/17/2014    Procedure: LAPAROSCOPIC CHOLECYSTECTOMY WITH INTRAOPERATIVE CHOLANGIOGRAM;  Surgeon: Dia Crawford III, MD;  Location: [facility] ORS;  Service: General;  Laterality: N/A;   History   Social History  . Marital Status: Married    Spouse Name: N/A  . Number of Children: N/A  . Years of Education: N/A   Occupational History  . Not on file.   Social History Main Topics  . Smoking status: Current Every Day Smoker -- 1.00 packs/day    Types: Cigarettes  . Smokeless tobacco: Never Used  . Alcohol Use: Yes     Comment: rare  . Drug Use: No  . Sexual Activity: Not on file   Other Topics Concern  . Not on file   Social History Narrative   Family History  Problem Relation Age of Onset  . Arthritis Mother   . Alcohol abuse Father   . Arthritis Father   . Hyperlipidemia Father   . Hypertension Father   . Diabetes Father   . Personality disorder Sister   . Anxiety disorder Sister   . Hypertension Brother   . Diabetes Maternal Grandmother   . Lung cancer Maternal Grandfather   .  Alcohol abuse Paternal Grandmother   . Breast cancer Paternal Grandmother   . Alcohol abuse Paternal Grandfather   . Arthritis Paternal Grandfather   . Heart disease Paternal Grandfather   . Hypertension Sister    No Known Allergies  Medication list has been reviewed and updated.   General: Denies fever, chills, sweats. No significant weight loss. Eyes: Denies blurring,significant itching ENT: Denies earache, sore throat, and hoarseness.  Cardiovascular: Denies chest pains, palpitations, dyspnea on exertion,  Respiratory: Denies cough, dyspnea at rest,wheeezing Breast: no concerns about lumps GI: ACUTE HOSPITALIZATION FOR  CHOLECYSTITIS GU: Denies dysuria, hematuria, urinary hesitancy, nocturia, denies STD risk, no concerns about discharge Musculoskeletal: Denies back pain, joint pain Derm: Denies rash, itching Neuro: Denies  paresthesias, frequent falls, frequent headaches Psych: Denies depression, anxiety Endocrine: Denies cold intolerance, heat intolerance, polydipsia Heme: Denies enlarged lymph nodes Allergy: No hayfever  Objective:   BP 138/80 mmHg  Pulse 88  Temp(Src) 98 F (36.7 C) (Oral)  Ht _0  (1.676 m)  Wt 342 lb 8 oz (155.357 kg)  BMI 55.31 kg/m2  LMP 11/16/2014 No exam data present  GEN: well developed, well nourished, no acute distress Eyes: conjunctiva and lids normal, PERRLA, EOMI ENT: TM clear, nares clear, oral exam WNL Neck: supple, no lymphadenopathy, no thyromegaly, no JVD Pulm: clear to auscultation and percussion, respiratory effort normal CV: regular rate and rhythm, S1-S2, no murmur, rub or gallop, no bruits Chest: no scars, masses, no lumps BREAST: breast exam declined GI: DEFER ABD EXAM - HAS SURGICAL F/U TODAY, INCISIONS C/D/I GU: GU exam declined Lymph: no cervical, axillary or inguinal adenopathy MSK: gait normal, muscle tone and strength WNL, no joint swelling, effusions, discoloration, crepitus  SKIN: clear, good turgor, color WNL, no rashes, lesions, or ulcerations Neuro: normal mental status, normal strength, sensation, and motion Psych: alert; oriented to person, place and time, normally interactive and not anxious or depressed in appearance.   All labs reviewed with patient. Lipids:    Component Value Date/Time   CHOL 229* 11/20/2014 1218   TRIG 164.0* 11/20/2014 1218   HDL 32.60* 11/20/2014 1218   VLDL 32.8 11/20/2014 1218   CHOLHDL 7 11/20/2014 1218   CBC: CBC Latest Ref Rng 11/20/2014 11/17/2014 11/16/2014  WBC 4.0 - 10.5 K/uL 13.7(H) 14.8(H) 19.3(H)  Hemoglobin 12.0 - 15.0 g/dL 12.9 12.5 14.1  Hematocrit 36.0 - 46.0 % 38.6 37.8 42.4  Platelets  150.0 - 400.0 K/uL 369.0 274 191    Basic Metabolic Panel:    Component Value Date/Time   NA 135 11/20/2014 1218   K 4.2 11/20/2014 1218   CL 99 11/20/2014 1218   CO2 28 11/20/2014 1218   BUN 9 11/20/2014 1218   CREATININE 0.77 11/20/2014 1218   GLUCOSE 101* 11/20/2014 1218   CALCIUM 9.4 11/20/2014 1218   Hepatic Function Latest Ref Rng 11/20/2014 11/17/2014 11/16/2014  Total Protein 6.0 - 8.3 g/dL 6.9 6.8 8.0  Albumin 3.5 - 5.2 g/dL 3.9 3.5 3.9  AST 0 - 37 U/L 76(H) 54(H) 70(H)  ALT 0 - 35 U/L 160(H) 182(H) 227(H)  Alk Phosphatase 39 - 117 U/L 149(H) 139(H) 164(H)  Total Bilirubin 0.2 - 1.2 mg/dL 0.7 0.9 0.8  Bilirubin, Direct 0.0 - 0.3 mg/dL 0.2 - -    Lab Results  Component Value Date   TSH 1.90 11/20/2014   US Abdomen Limited Ruq  11/17/2014   CLINICAL DATA:  Initial evaluation for acute right upper quadrant pain for 3 days.  EXAM: US ABDOMEN  LIMITED - RIGHT UPPER QUADRANT  COMPARISON:  None.  FINDINGS: Gallbladder:  Large approximately 2.2 cm shadowing echogenic gallstone present within the gallbladder lumen. Minimal movement with patient positioning. No free pericholecystic fluid. No gallbladder wall thickening. Positive sonographic Murphy sign elicited on exam.  Common bile duct:  Diameter: 4.1 mm  Liver:  No focal lesion identified. Within normal limits in parenchymal echogenicity.  IMPRESSION: Cholelithiasis with positive sonographic Murphy sign. No other sonographic evidence for acute cholecystitis such as gallbladder wall thickening or free pericholecystic fluid. No biliary dilatation.   Electronically Signed   By: Jeannine Boga M.D.   On: 11/17/2014 01:31    Assessment and Plan:   Routine general medical examination at a health care facility  Sleep apnea, obstructive - Plan: Ambulatory referral to Pulmonology  Tobacco abuse  5 minutes spent in counselling: The patient does smoke cigarettes, and we discussed that tobacco is harmful to one's overall health, and I  made the recommendation to quit tobacco products.   Recheck BP after OSA recheck and trial of wt loss  Health Maintenance Exam: The patient's preventative maintenance and recommended screening tests for an annual wellness exam were reviewed in full today. Brought up to date unless services declined.  Counselled on the importance of diet, exercise, and its role in overall health and mortality. The patient's FH and SH was reviewed, including their home life, tobacco status, and drug and alcohol status.  Follow-up: Return in about 4 months (around 03/28/2015). Or follow-up in 1 year for complete physical examination  New Prescriptions   No medications on file   Orders Placed This Encounter  Procedures  . Ambulatory referral to Pulmonology    Signed,  Frederico Hamman T. Darlin Stenseth, MD   Patient's Medications  New Prescriptions   No medications on file  Previous Medications   No medications on file  Modified Medications   No medications on file  Discontinued Medications   AMITRIPTYLINE (ELAVIL) 25 MG TABLET    Take 25 mg by mouth at bedtime as needed for sleep.   FAMOTIDINE (PEPCID AC) 10 MG CHEWABLE TABLET    Chew 10 mg by mouth 2 (two) times daily as needed for heartburn.   HYDROCODONE-ACETAMINOPHEN (NORCO/VICODIN) 5-325 MG PER TABLET    Take 1-2 tablets by mouth every 6 (six) hours as needed for moderate pain.   IBUPROFEN (ADVIL,MOTRIN) 200 MG TABLET    Take 400 mg by mouth every 6 (six) hours as needed.

## 2014-11-26 NOTE — Progress Notes (Signed)
Pre visit review using our clinic review tool, if applicable. No additional management support is needed unless otherwise documented below in the visit note. 

## 2014-11-26 NOTE — Patient Instructions (Signed)

## 2014-11-26 NOTE — Progress Notes (Signed)
Outpatient Surgical Follow Up  11/26/2014  Andrea Hull is an 38 y.o. female.   Chief Complaint  Patient presents with  . Routine Post Op    Lap Chole    HPI: She returns following her laparoscopic cholecystectomy for acute cholecystitis. She's doing relatively well with some mild right upper quadrant pain but no major GI or GU symptoms.  Past Medical History  Diagnosis Date  . Arthritis   . Hypertension   . Sleep apnea, obstructive   . Morbid obesity with BMI of 50.0-59.9, adult   . Tobacco abuse     Past Surgical History  Procedure Laterality Date  . Cesarean section    . Breast biospy  2013  . Miscarriage  2008  . Cholecystectomy N/A 11/17/2014    Procedure: LAPAROSCOPIC CHOLECYSTECTOMY WITH INTRAOPERATIVE CHOLANGIOGRAM;  Surgeon: Tiney Rouge III, MD;  Location: [facility] ORS;  Service: General;  Laterality: N/A;    Family History  Problem Relation Age of Onset  . Arthritis Mother   . Alcohol abuse Father   . Arthritis Father   . Hyperlipidemia Father   . Hypertension Father   . Diabetes Father   . Personality disorder Sister   . Anxiety disorder Sister   . Hypertension Brother   . Diabetes Maternal Grandmother   . Lung cancer Maternal Grandfather   . Alcohol abuse Paternal Grandmother   . Breast cancer Paternal Grandmother   . Alcohol abuse Paternal Grandfather   . Arthritis Paternal Grandfather   . Heart disease Paternal Grandfather   . Hypertension Sister     Social History:  reports that she has been smoking Cigarettes.  She has been smoking about 1.00 pack per day. She has never used smokeless tobacco. She reports that she drinks alcohol. She reports that she does not use illicit drugs.  Allergies: No Known Allergies  Medications reviewed.    ROS No changes review of systems    BP 168/99 mmHg  Pulse 87  Temp(Src) 97.9 F (36.6 C) (Oral)  Ht 5\' 6"  (1.676 m)  Wt 323 lb (146.512 kg)  BMI 52.16 kg/m2  LMP 11/16/2014  Physical Exam  Her abdomen  is soft. Sutures were removed. She has no significant abdominal findings.   No results found for this or any previous visit (from the past 48 hour(s)). No results found.  Assessment/Plan:  1. Calculus of gallbladder with acute cholecystitis without obstruction She's recovering uneventfully. We discussed liberalizing her activity restrictions. We will see her back in our office as necessary.     Tiney Rouge III  11/26/2014,negative

## 2014-12-02 ENCOUNTER — Encounter: Payer: Self-pay | Admitting: Internal Medicine

## 2014-12-02 ENCOUNTER — Ambulatory Visit (INDEPENDENT_AMBULATORY_CARE_PROVIDER_SITE_OTHER): Admitting: Internal Medicine

## 2014-12-02 VITALS — BP 138/80 | HR 91 | Temp 98.6°F | Wt 316.0 lb

## 2014-12-02 DIAGNOSIS — J069 Acute upper respiratory infection, unspecified: Secondary | ICD-10-CM | POA: Diagnosis not present

## 2014-12-02 DIAGNOSIS — B9789 Other viral agents as the cause of diseases classified elsewhere: Principal | ICD-10-CM

## 2014-12-02 MED ORDER — AZITHROMYCIN 250 MG PO TABS: ORAL_TABLET | ORAL | Status: DC

## 2014-12-02 MED ORDER — HYDROCODONE-HOMATROPINE 5-1.5 MG/5ML PO SYRP: 5.0000 mL | ORAL_SOLUTION | Freq: Three times a day (TID) | ORAL | Status: DC | PRN

## 2014-12-02 NOTE — Patient Instructions (Signed)
Upper Respiratory Infection, Adult An upper respiratory infection (URI) is also sometimes known as the common cold. The upper respiratory tract includes the nose, sinuses, throat, trachea, and bronchi. Bronchi are the airways leading to the lungs. Most people improve within 1 week, but symptoms can last up to 2 weeks. A residual cough may last even longer.  CAUSES Many different viruses can infect the tissues lining the upper respiratory tract. The tissues become irritated and inflamed and often become very moist. Mucus production is also common. A cold is contagious. You can easily spread the virus to others by oral contact. This includes kissing, sharing a glass, coughing, or sneezing. Touching your mouth or nose and then touching a surface, which is then touched by another person, can also spread the virus. SYMPTOMS  Symptoms typically develop 1 to 3 days after you come in contact with a cold virus. Symptoms vary from person to person. They may include:  Runny nose.  Sneezing.  Nasal congestion.  Sinus irritation.  Sore throat.  Loss of voice (laryngitis).  Cough.  Fatigue.  Muscle aches.  Loss of appetite.  Headache.  Low-grade fever. DIAGNOSIS  You might diagnose your own cold based on familiar symptoms, since most people get a cold 2 to 3 times a year. Your caregiver can confirm this based on your exam. Most importantly, your caregiver can check that your symptoms are not due to another disease such as strep throat, sinusitis, pneumonia, asthma, or epiglottitis. Blood tests, throat tests, and X-rays are not necessary to diagnose a common cold, but they may sometimes be helpful in excluding other more serious diseases. Your caregiver will decide if any further tests are required. RISKS AND COMPLICATIONS  You may be at risk for a more severe case of the common cold if you smoke cigarettes, have chronic heart disease (such as heart failure) or lung disease (such as asthma), or if  you have a weakened immune system. The very young and very old are also at risk for more serious infections. Bacterial sinusitis, middle ear infections, and bacterial pneumonia can complicate the common cold. The common cold can worsen asthma and chronic obstructive pulmonary disease (COPD). Sometimes, these complications can require emergency medical care and may be life-threatening. PREVENTION  The best way to protect against getting a cold is to practice good hygiene. Avoid oral or hand contact with people with cold symptoms. Wash your hands often if contact occurs. There is no clear evidence that vitamin C, vitamin E, echinacea, or exercise reduces the chance of developing a cold. However, it is always recommended to get plenty of rest and practice good nutrition. TREATMENT  Treatment is directed at relieving symptoms. There is no cure. Antibiotics are not effective, because the infection is caused by a virus, not by bacteria. Treatment may include:  Increased fluid intake. Sports drinks offer valuable electrolytes, sugars, and fluids.  Breathing heated mist or steam (vaporizer or shower).  Eating chicken soup or other clear broths, and maintaining good nutrition.  Getting plenty of rest.  Using gargles or lozenges for comfort.  Controlling fevers with ibuprofen or acetaminophen as directed by your caregiver.  Increasing usage of your inhaler if you have asthma. Zinc gel and zinc lozenges, taken in the first 24 hours of the common cold, can shorten the duration and lessen the severity of symptoms. Pain medicines may help with fever, muscle aches, and throat pain. A variety of non-prescription medicines are available to treat congestion and runny nose. Your caregiver   can make recommendations and may suggest nasal or lung inhalers for other symptoms.  HOME CARE INSTRUCTIONS   Only take over-the-counter or prescription medicines for pain, discomfort, or fever as directed by your  caregiver.  Use a warm mist humidifier or inhale steam from a shower to increase air moisture. This may keep secretions moist and make it easier to breathe.  Drink enough water and fluids to keep your urine clear or pale yellow.  Rest as needed.  Return to work when your temperature has returned to normal or as your caregiver advises. You may need to stay home longer to avoid infecting others. You can also use a face mask and careful hand washing to prevent spread of the virus. SEEK MEDICAL CARE IF:   After the first few days, you feel you are getting worse rather than better.  You need your caregiver's advice about medicines to control symptoms.  You develop chills, worsening shortness of breath, or brown or red sputum. These may be signs of pneumonia.  You develop yellow or brown nasal discharge or pain in the face, especially when you bend forward. These may be signs of sinusitis.  You develop a fever, swollen neck glands, pain with swallowing, or white areas in the back of your throat. These may be signs of strep throat. SEEK IMMEDIATE MEDICAL CARE IF:   You have a fever.  You develop severe or persistent headache, ear pain, sinus pain, or chest pain.  You develop wheezing, a prolonged cough, cough up blood, or have a change in your usual mucus (if you have chronic lung disease).  You develop sore muscles or a stiff neck. Document Released: 11/29/2000 Document Revised: 08/28/2011 Document Reviewed: 09/10/2013 [employer] Patient Information 2015 [employer]. This information is not intended to replace advice given to you by your health care provider. Make sure you discuss any questions you have with your health care provider.  

## 2014-12-02 NOTE — Progress Notes (Signed)
HPI  Pt presents to the clinic today with c/o nasal congestion, sore throat and cough x 3 days. The cough is productive of thick green mucous. She reports she is blowing green mucous out of her nose as well. She denies fevers but has had chills and body aches.  She has tried Nyquil without much relief. She has had sick contacts with similar symptoms. She has no history of seasonal allergies but she does smoke and reports she gets bronchitis frequently.  Review of Systems      Past Medical History  Diagnosis Date  . Arthritis   . Hypertension   . Sleep apnea, obstructive   . Morbid obesity with BMI of 50.0-59.9, adult   . Tobacco abuse     Family History  Problem Relation Age of Onset  . Arthritis Mother   . Alcohol abuse Father   . Arthritis Father   . Hyperlipidemia Father   . Hypertension Father   . Diabetes Father   . Personality disorder Sister   . Anxiety disorder Sister   . Hypertension Brother   . Diabetes Maternal Grandmother   . Lung cancer Maternal Grandfather   . Alcohol abuse Paternal Grandmother   . Breast cancer Paternal Grandmother   . Alcohol abuse Paternal Grandfather   . Arthritis Paternal Grandfather   . Heart disease Paternal Grandfather   . Hypertension Sister     History   Social History  . Marital Status: Married    Spouse Name: N/A  . Number of Children: N/A  . Years of Education: N/A   Occupational History  . Not on file.   Social History Main Topics  . Smoking status: Current Every Day Smoker -- 1.00 packs/day    Types: Cigarettes  . Smokeless tobacco: Never Used  . Alcohol Use: Yes     Comment: rare  . Drug Use: No  . Sexual Activity: Not on file   Other Topics Concern  . Not on file   Social History Narrative    No Known Allergies   Constitutional: Positive fatigue. Denies headache, fever or abrupt weight changes.  HEENT:  Positive nasal congestion, sore throat. Denies eye redness, eye pain, pressure behind the eyes,  facial pain, ear pain, ringing in the ears, wax buildup, runny nose or bloody nose. Respiratory: Positive cough. Denies difficulty breathing or shortness of breath.  Cardiovascular: Denies chest pain, chest tightness, palpitations or swelling in the hands or feet.   No other specific complaints in a complete review of systems (except as listed in HPI above).  Objective:   BP 138/80 mmHg  Pulse 91  Temp(Src) 98.6 F (37 C) (Tympanic)  Wt 316 lb (143.337 kg)  SpO2 98%  LMP 11/16/2014  Wt Readings from Last 3 Encounters:  11/26/14 323 lb (146.512 kg)  11/26/14 342 lb 8 oz (155.357 kg)  11/17/14 341 lb (154.677 kg)     General: Appears her stated age, obese in NAD. HEENT: Head: normal shape and size, no sinus tenderness noted; Eyes: sclera white, no icterus, conjunctiva pink; Ears: Tm's gray and intact, normal light reflex; Nose: mucosa boggy and moist, septum midline; Throat/Mouth: Teeth present, mucosa pink and moist, no exudate noted, no lesions or ulcerations noted.  Neck: No cervical lymphadenopathy.  Cardiovascular: Normal rate and rhythm. S1,S2 noted.  No murmur, rubs or gallops noted.. Pulmonary/Chest: Normal effort and diminshed vesicular breath sounds. No respiratory distress. No wheezes, rales or ronchi noted.      Assessment & Plan:  Viral Upper Respiratory Infection with cough:  Get some rest and drink plenty of water Do salt water gargles for the sore throat Advised her to try Mucinex for the next 2-3 days  If no improvement over the weekend, go ahead and start antibiotic. Printed Rx for Azithromax x 5 days, only if needed if symptoms worsen Rx for Hycodan cough syrup  RTC as needed or if symptoms persist.

## 2014-12-02 NOTE — Progress Notes (Signed)
Pre visit review using our clinic review tool, if applicable. No additional management support is needed unless otherwise documented below in the visit note. 

## 2015-02-01 ENCOUNTER — Encounter: Payer: Self-pay | Admitting: Pulmonary Disease

## 2015-02-01 ENCOUNTER — Ambulatory Visit (INDEPENDENT_AMBULATORY_CARE_PROVIDER_SITE_OTHER): Admitting: Pulmonary Disease

## 2015-02-01 VITALS — BP 162/100 | HR 102 | Ht 66.0 in | Wt 347.0 lb

## 2015-02-01 DIAGNOSIS — G4733 Obstructive sleep apnea (adult) (pediatric): Secondary | ICD-10-CM | POA: Diagnosis not present

## 2015-02-01 NOTE — Patient Instructions (Signed)
Will arrange for home sleep study Will call to arrange for follow up after sleep study reviewed  

## 2015-02-01 NOTE — Progress Notes (Signed)
Chief Complaint  Patient presents with  . Sleep Consult    referred by Dr. Dallas Schimke.  Uses CPAP on 10cm.  Sleep study done 2007 in [place], [place]  Epworth Score: 10    History of Present Illness: Andrea Hull is a 39 y.o. female for evaluation of sleep problems.  She has hx of sleep apnea.  She had sleep study in 2007 and was started on CPAP.  She has same machine since then.  She needed new supplies and was told she needed a script and sleep study to get new supplies.  She bought individual parts on line.  She does not feel her sleep is as good as original CPAP set up.  She is starting to snore even when using CPAP and feels sleepy during the day more.  If she does not use CPAP, then she snores/stops breathing/wakes up frequently.  She goes to sleep at 11 pm.  She falls asleep quickly usually, but can sometimes take up to couple of hours if she feels stressed >> doesn't happen often.  She wakes up a few times to use the bathroom.  She gets out of bed between 7 and 10 am.  She feels more tired in the morning.  She denies morning headache.  She does not use anything to help her fall sleep or stay awake.  She denies sleep walking, sleep talking, bruxism, or nightmares.  There is no history of restless legs.  She denies sleep hallucinations, sleep paralysis, or cataplexy.  The Epworth score is 10 out of 24.   Andrea Hull  has a past medical history of Arthritis; Hypertension; Sleep apnea, obstructive; Morbid obesity with BMI of 50.0-59.9, adult; and Tobacco abuse.  Andrea Hull  has past surgical history that includes Cesarean section; Breast Biospy (2013); Miscarriage (2008); and Cholecystectomy (N/A, 11/17/2014).  Prior to Admission medications   Medication Sig Start Date End Date Taking? Authorizing Provider  famotidine (PEPCID) 20 MG tablet Take 20 mg by mouth 2 (two) times daily.   Yes Historical Provider, MD    No Known Allergies  Her family history includes Alcohol abuse in  her father, paternal grandfather, and paternal grandmother; Anxiety disorder in her sister; Arthritis in her father, mother, and paternal grandfather; Breast cancer in her paternal grandmother; Diabetes in her father and maternal grandmother; Heart disease in her paternal grandfather; Hyperlipidemia in her father; Hypertension in her brother, father, and sister; Lung cancer in her maternal grandfather; Personality disorder in her sister.  She  reports that she has been smoking Cigarettes.  She has a 20 pack-year smoking history. She has never used smokeless tobacco. She reports that she drinks alcohol. She reports that she does not use illicit drugs.  Review of Systems  Constitutional: Negative for fever, chills and unexpected weight change.  HENT: Negative for congestion, dental problem, ear pain, nosebleeds, postnasal drip, rhinorrhea, sinus pressure, sneezing, sore throat, trouble swallowing and voice change.   Eyes: Negative for visual disturbance.  Respiratory: Negative for cough, choking and shortness of breath.   Cardiovascular: Negative for chest pain and leg swelling.  Gastrointestinal: Negative for vomiting, abdominal pain and diarrhea.  Genitourinary: Negative for difficulty urinating.  Musculoskeletal: Negative for arthralgias.  Skin: Negative for rash.  Neurological: Negative for tremors, syncope and headaches.  Hematological: Does not bruise/bleed easily.   Physical Exam: BP 162/100 mmHg  Pulse 102  Ht 5\' 6"  (1.676 m)  Wt 347 lb (157.398 kg)  BMI 56.03 kg/m2  SpO2  98%  General - No distress ENT - No sinus tenderness, no oral exudate, no LAN, no thyromegaly, TM clear, pupils equal/reactive, MP 4 Cardiac - s1s2 regular, no murmur, pulses symmetric Chest - No wheeze/rales/dullness, good air entry, normal respiratory excursion Back - No focal tenderness Abd - Soft, non-tender, no organomegaly, + bowel sounds Ext - No edema Neuro - Normal strength, cranial nerves intact Skin  - No rashes Psych - Normal mood, and behavior  Discussion: She has hx of snoring, sleep disruption, witnessed apnea, and daytime sleepiness.  This was initially improved with CPAP.  Her set up is almost 39 yrs old.  She has noticed her symptoms recurring in spite of compliance with CPAP.  I am concerned that either her sleep apnea has progressed and/or she needs updated equipment.  We discussed how sleep apnea can affect various health problems including risks for hypertension, cardiovascular disease, and diabetes.  We also discussed how sleep disruption can increase risks for accident, such as while driving.  Weight loss as a means of improving sleep apnea was also reviewed.  Additional treatment options discussed were CPAP therapy, oral appliance, and surgical intervention.  Assessment/plan:  Obstructive sleep apnea. Plan: - will arrange for home sleep study to assess her current status of sleep apnea - will then likely arrange for new CPAP machine and supplies  Hypertension. Plan: - she is to f/u with her PCP  Obesity. Plan: - discussed options to assist with weight loss    Coralyn Helling, M.D. Pager [phone]

## 2015-02-01 NOTE — Progress Notes (Deleted)
   Subjective:    Patient ID: Cristi Loron, female    DOB: [birth year], 39 y.o.   MRN: [id number]  HPI    Review of Systems  Constitutional: Negative for fever, chills and unexpected weight change.  HENT: Negative for congestion, dental problem, ear pain, nosebleeds, postnasal drip, rhinorrhea, sinus pressure, sneezing, sore throat, trouble swallowing and voice change.   Eyes: Negative for visual disturbance.  Respiratory: Negative for cough, choking and shortness of breath.   Cardiovascular: Negative for chest pain and leg swelling.  Gastrointestinal: Negative for vomiting, abdominal pain and diarrhea.  Genitourinary: Negative for difficulty urinating.  Musculoskeletal: Negative for arthralgias.  Skin: Negative for rash.  Neurological: Negative for tremors, syncope and headaches.  Hematological: Does not bruise/bleed easily.       Objective:   Physical Exam        Assessment & Plan:

## 2015-02-11 DIAGNOSIS — G4733 Obstructive sleep apnea (adult) (pediatric): Secondary | ICD-10-CM | POA: Diagnosis not present

## 2015-02-16 ENCOUNTER — Telehealth: Payer: Self-pay | Admitting: Pulmonary Disease

## 2015-02-17 ENCOUNTER — Encounter: Payer: Self-pay | Admitting: Pulmonary Disease

## 2015-02-17 ENCOUNTER — Other Ambulatory Visit: Payer: Self-pay | Admitting: *Deleted

## 2015-02-17 DIAGNOSIS — G4733 Obstructive sleep apnea (adult) (pediatric): Secondary | ICD-10-CM | POA: Diagnosis not present

## 2015-02-25 NOTE — Telephone Encounter (Signed)
Tommie Sams, CMA at 02/17/2015 10:38 AM     Status: Signed       Expand All Collapse All   ATC received fast busy signal. [facility]             Coralyn Helling, MD at 02/16/2015 7:57 AM     Status: Signed       Expand All Collapse All   HST 02/11/15 >> AHI 51.8, SaO2 low 87%.  Will have my nurse inform pt that sleep study shows severe sleep apnea.  Options are 1) CPAP now, 2) ROV first.  If She is agreeable to CPAP, then please send order for auto CPAP range 5 to 15 cm H2O with heated humidity and mask of choice. Have download sent 1 month after starting CPAP and set up ROV 2 months after starting CPAP.

## 2015-02-25 NOTE — Telephone Encounter (Signed)
Called and spoke with pt. Reviewed results and recs Scheduled appt with TP on 03/16/15 Pt voiced understanding and had no further questions Nothing further needed  

## 2015-02-26 ENCOUNTER — Encounter: Payer: Self-pay | Admitting: Family Medicine

## 2015-02-26 ENCOUNTER — Ambulatory Visit (INDEPENDENT_AMBULATORY_CARE_PROVIDER_SITE_OTHER): Admitting: Family Medicine

## 2015-02-26 VITALS — BP 130/80 | HR 89 | Temp 98.3°F | Ht 66.0 in | Wt 346.5 lb

## 2015-02-26 DIAGNOSIS — N390 Urinary tract infection, site not specified: Secondary | ICD-10-CM | POA: Insufficient documentation

## 2015-02-26 DIAGNOSIS — N3000 Acute cystitis without hematuria: Secondary | ICD-10-CM | POA: Diagnosis not present

## 2015-02-26 DIAGNOSIS — R109 Unspecified abdominal pain: Secondary | ICD-10-CM | POA: Diagnosis not present

## 2015-02-26 LAB — POCT URINALYSIS DIPSTICK
Glucose, UA: NEGATIVE
NITRITE UA: NEGATIVE
SPEC GRAV UA: 1.025
UROBILINOGEN UA: 0.2
pH, UA: 6

## 2015-02-26 MED ORDER — SULFAMETHOXAZOLE-TRIMETHOPRIM 800-160 MG PO TABS: 1.0000 | ORAL_TABLET | Freq: Two times a day (BID) | ORAL | Status: DC

## 2015-02-26 NOTE — Assessment & Plan Note (Signed)
Uncomplicated Cover with bactrim DS Enc fluids cx pending Update if not starting to improve in a week or if worsening

## 2015-02-26 NOTE — Progress Notes (Signed)
Subjective:    Patient ID: Andrea Hull, female    DOB: 18-Feb-1976, 39 y.o.   MRN: [id number]  HPI Here with uti  Has had them in the past   Carbonated drinks tend to bring them on   Woke up with bladder pressure and frequency  Bad pressure at the end of urination  ? If blood in urine - started menses  Not on contraception -no chance pregnant   No flank pain or fever or nausea   Results for orders placed or performed in visit on 02/26/15  POCT urinalysis dipstick  Result Value Ref Range   Color, UA Yellow    Clarity, UA Cloudy    Glucose, UA Neg.    Bilirubin, UA Trace    Ketones, UA Small    Spec Grav, UA 1.025    Blood, UA Large    pH, UA 6.0    Protein, UA 30 mg/dL    Urobilinogen, UA 0.2    Nitrite, UA Neg.    Leukocytes, UA large (3+) (A) Negative    Patient Active Problem List   Diagnosis Date Noted  . Acute gallstone pancreatitis 11/17/2014  . Morbid obesity with BMI of 50.0-59.9, adult 04/15/2013  . Sleep apnea, obstructive   . Tobacco abuse   . Hypertension   . Arthritis    Past Medical History  Diagnosis Date  . Arthritis   . Hypertension   . Sleep apnea, obstructive   . Morbid obesity with BMI of 50.0-59.9, adult   . Tobacco abuse    Past Surgical History  Procedure Laterality Date  . Cesarean section    . Breast biospy  2013  . Miscarriage  2008  . Cholecystectomy N/A 11/17/2014    Procedure: LAPAROSCOPIC CHOLECYSTECTOMY WITH INTRAOPERATIVE CHOLANGIOGRAM;  Surgeon: Tiney Rouge III, MD;  Location: [facility] ORS;  Service: General;  Laterality: N/A;   Social History  Substance Use Topics  . Smoking status: Current Every Day Smoker -- 1.00 packs/day for 20 years    Types: Cigarettes  . Smokeless tobacco: Never Used  . Alcohol Use: 0.0 oz/week    0 Standard drinks or equivalent per week     Comment: rare   Family History  Problem Relation Age of Onset  . Arthritis Mother   . Alcohol abuse Father   . Arthritis Father   . Hyperlipidemia Father    . Hypertension Father   . Diabetes Father   . Personality disorder Sister   . Anxiety disorder Sister   . Hypertension Brother   . Diabetes Maternal Grandmother   . Lung cancer Maternal Grandfather   . Alcohol abuse Paternal Grandmother   . Breast cancer Paternal Grandmother   . Alcohol abuse Paternal Grandfather   . Arthritis Paternal Grandfather   . Heart disease Paternal Grandfather   . Hypertension Sister    No Known Allergies Current Outpatient Prescriptions on File Prior to Visit  Medication Sig Dispense Refill  . famotidine (PEPCID) 20 MG tablet Take 20 mg by mouth daily as needed.      No current facility-administered medications on file prior to visit.     Review of Systems Review of Systems  Constitutional: Negative for fever, appetite change, fatigue and unexpected weight change.  Eyes: Negative for pain and visual disturbance.  Respiratory: Negative for cough and shortness of breath.   Cardiovascular: Negative for cp or palpitations    Gastrointestinal: Negative for nausea, diarrhea and constipation.  Genitourinary: pos for urgency and frequency. neg  for flank pain /hemturia or fever  Skin: Negative for pallor or rash   Neurological: Negative for weakness, light-headedness, numbness and headaches.  Hematological: Negative for adenopathy. Does not bruise/bleed easily.  Psychiatric/Behavioral: Negative for dysphoric mood. The patient is not nervous/anxious.         Objective:   Physical Exam  Constitutional: She appears well-developed and well-nourished. No distress.  obese and well appearing   HENT:  Head: Normocephalic and atraumatic.  Eyes: Conjunctivae and EOM are normal. Pupils are equal, round, and reactive to light.  Neck: Normal range of motion. Neck supple.  Cardiovascular: Normal rate, regular rhythm and normal heart sounds.   Pulmonary/Chest: Effort normal and breath sounds normal.  Abdominal: Soft. Bowel sounds are normal. She exhibits no  distension. There is tenderness. There is no rebound.  No cva tenderness  Mild suprapubic tenderness  Musculoskeletal: She exhibits no edema.  Lymphadenopathy:    She has no cervical adenopathy.  Neurological: She is alert.  Skin: No rash noted.  Psychiatric: She has a normal mood and affect.          Assessment & Plan:   Problem List Items Addressed This Visit      Genitourinary   UTI (urinary tract infection) - Primary    Uncomplicated Cover with bactrim DS Enc fluids cx pending Update if not starting to improve in a week or if worsening        Relevant Medications   sulfamethoxazole-trimethoprim (BACTRIM DS,SEPTRA DS) 800-160 MG per tablet   Other Relevant Orders   Urine culture (Completed)    Other Visit Diagnoses    Abdominal pressure        Relevant Orders    POCT urinalysis dipstick (Completed)

## 2015-02-26 NOTE — Progress Notes (Signed)
Pre visit review using our clinic review tool, if applicable. No additional management support is needed unless otherwise documented below in the visit note. 

## 2015-02-26 NOTE — Patient Instructions (Signed)
Drink lots of water  Take bactrim DS as directed  We will culture your urine and update you with a result Update if not starting to improve in a week or if worsening

## 2015-03-01 LAB — URINE CULTURE

## 2015-03-16 ENCOUNTER — Encounter: Payer: Self-pay | Admitting: Adult Health

## 2015-03-16 ENCOUNTER — Ambulatory Visit (INDEPENDENT_AMBULATORY_CARE_PROVIDER_SITE_OTHER): Admitting: Adult Health

## 2015-03-16 ENCOUNTER — Telehealth: Payer: Self-pay | Admitting: Pulmonary Disease

## 2015-03-16 VITALS — BP 134/86 | HR 91 | Temp 98.0°F | Ht 66.0 in | Wt 343.0 lb

## 2015-03-16 DIAGNOSIS — Z6841 Body Mass Index (BMI) 40.0 and over, adult: Secondary | ICD-10-CM

## 2015-03-16 DIAGNOSIS — G4733 Obstructive sleep apnea (adult) (pediatric): Secondary | ICD-10-CM | POA: Diagnosis not present

## 2015-03-16 NOTE — Patient Instructions (Signed)
New CPAP order to DME .  Wear at least 6hrs each night .  Work on weight loss Do not drive if sleepy.  CPAP download in 1 month .  follow up Dr. Craige Cotta  In 3 months and As needed

## 2015-03-16 NOTE — Assessment & Plan Note (Signed)
Wt loss encouraged  

## 2015-03-16 NOTE — Assessment & Plan Note (Addendum)
Severe OSA with AHI 51.8 on HST  Pt needs new machine , order sent to DME   Plan  New CPAP order to DME .  Wear at least 6hrs each night .  Work on weight loss Do not drive if sleepy.  CPAP download in 1 month .  follow up Dr. Craige Cotta  In 3 months and As needed

## 2015-03-16 NOTE — Telephone Encounter (Signed)
VS pt is to follow up in 3 mos, no open appt available.  Please advise.  Thanks!

## 2015-03-16 NOTE — Progress Notes (Signed)
   Subjective:    Patient ID: Andrea Hull, female    DOB: 03-Jul-1975, 39 y.o.   MRN: [id number]  HPI 39 yo female seen for sleep consult 01/2015   TEST  02/11/15 : HST with AHI 51.8.with SaO2 87%..    03/16/2015 Follow up OSA  Pt returns for follow up for sleep apnea.  Pt was seen for sleep consult last month.  Has been on CPAP since 2007 but machine is not workin g Needed  new study so she could get new machine.  She was set up for HST with AHI 51.8.with SaO2 87%..  We discussed OSA and CPAP usage.  Wt loss encouraged.        Review of Systems Constitutional:   No  weight loss, night sweats,  Fevers, chills, + fatigue, or  lassitude.  HEENT:   No headaches,  Difficulty swallowing,  Tooth/dental problems, or  Sore throat,                No sneezing, itching, ear ache, nasal congestion, post nasal drip,   CV:  No chest pain,  Orthopnea, PND, swelling in lower extremities, anasarca, dizziness, palpitations, syncope.   GI  No heartburn, indigestion, abdominal pain, nausea, vomiting, diarrhea, change in bowel habits, loss of appetite, bloody stools.   Resp: No shortness of breath with exertion or at rest.  No excess mucus, no productive cough,  No non-productive cough,  No coughing up of blood.  No change in color of mucus.  No wheezing.  No chest wall deformity  Skin: no rash or lesions.  GU: no dysuria, change in color of urine, no urgency or frequency.  No flank pain, no hematuria   MS:  No joint pain or swelling.  No decreased range of motion.  No back pain.  Psych:  No change in mood or affect. No depression or anxiety.  No memory loss.         Objective:   Physical Exam GEN: A/Ox3; pleasant , NAD, obese  Vitals signs reviewed   HEENT:  [place]/AT,  EACs-clear, TMs-wnl, NOSE-clear, THROAT-clear, no lesions, no postnasal drip or exudate noted.   NECK:  Supple w/ fair ROM; no JVD; normal carotid impulses w/o bruits; no thyromegaly or nodules palpated; no  lymphadenopathy.  RESP  Clear  P & A; w/o, wheezes/ rales/ or rhonchi.no accessory muscle use, no dullness to percussion  CARD:  RRR, no m/r/g  , no peripheral edema, pulses intact, no cyanosis or clubbing.  GI:   Soft & nt; nml bowel sounds; no organomegaly or masses detected.  Musco: Warm bil, no deformities or joint swelling noted.   Neuro: alert, no focal deficits noted.    Skin: Warm, no lesions or rashes         Assessment & Plan:

## 2015-03-17 NOTE — Progress Notes (Signed)
Reviewed and agree with assessment/plan. 

## 2015-03-17 NOTE — Telephone Encounter (Signed)
Patient's follow up appointment is in January.  We do not have January schedule out yet.  Placed on Recall for January. Patient notified. Nothing further needed.

## 2015-03-17 NOTE — Telephone Encounter (Signed)
Double, triple, quadruple pts as needed.

## 2015-04-07 ENCOUNTER — Ambulatory Visit (INDEPENDENT_AMBULATORY_CARE_PROVIDER_SITE_OTHER)

## 2015-04-07 DIAGNOSIS — Z23 Encounter for immunization: Secondary | ICD-10-CM | POA: Diagnosis not present

## 2015-04-30 ENCOUNTER — Telehealth: Payer: Self-pay | Admitting: Adult Health

## 2015-04-30 NOTE — Telephone Encounter (Signed)
Per TP after reviewing CPAP compliance report Great job Good control No changes needed  Called and spoke with pt. Reviewed results and recs. Patient voiced understanding and had no further questions. Will sign off on message.

## 2015-05-14 ENCOUNTER — Encounter: Payer: Self-pay | Admitting: Emergency Medicine

## 2015-05-14 ENCOUNTER — Ambulatory Visit
Admission: EM | Admit: 2015-05-14 | Discharge: 2015-05-14 | Disposition: A | Discharge status: Home / Self Care | Attending: Family Medicine | Admitting: Family Medicine

## 2015-05-14 DIAGNOSIS — J029 Acute pharyngitis, unspecified: Secondary | ICD-10-CM | POA: Diagnosis not present

## 2015-05-14 LAB — RAPID STREP SCREEN (MED CTR MEBANE ONLY): STREPTOCOCCUS, GROUP A SCREEN (DIRECT): NEGATIVE

## 2015-05-14 MED ORDER — IBUPROFEN 800 MG PO TABS
800.0000 mg | ORAL_TABLET | Freq: Once | ORAL | Status: AC
  Administered 2015-05-14: 800 mg via ORAL

## 2015-05-14 NOTE — Discharge Instructions (Signed)
Ibuprofen 600- 800 mg three times daily with meals for next 2-3 days Lots of water- void every 2 hours-lips dry ?? Behind on fluids ! Gargles.... Rest ...early morning walks while pollen is down  Pharyngitis Pharyngitis is redness, pain, and swelling (inflammation) of your pharynx.  CAUSES  Pharyngitis is usually caused by infection. Most of the time, these infections are from viruses (viral) and are part of a cold. However, sometimes pharyngitis is caused by bacteria (bacterial). Pharyngitis can also be caused by allergies. Viral pharyngitis may be spread from person to person by coughing, sneezing, and personal items or utensils (cups, forks, spoons, toothbrushes). Bacterial pharyngitis may be spread from person to person by more intimate contact, such as kissing.  SIGNS AND SYMPTOMS  Symptoms of pharyngitis include:   Sore throat.   Tiredness (fatigue).   Low-grade fever.   Headache.  Joint pain and muscle aches.  Skin rashes.  Swollen lymph nodes.  Plaque-like film on throat or tonsils (often seen with bacterial pharyngitis). DIAGNOSIS  Your health care provider will ask you questions about your illness and your symptoms. Your medical history, along with a physical exam, is often all that is needed to diagnose pharyngitis. Sometimes, a rapid strep test is done. Other lab tests may also be done, depending on the suspected cause.  TREATMENT  Viral pharyngitis will usually get better in 3-4 days without the use of medicine. Bacterial pharyngitis is treated with medicines that kill germs (antibiotics).  HOME CARE INSTRUCTIONS   Drink enough water and fluids to keep your urine clear or pale yellow.   Only take over-the-counter or prescription medicines as directed by your health care provider:   If you are prescribed antibiotics, make sure you finish them even if you start to feel better.   Do not take aspirin.   Get lots of rest.   Gargle with 8 oz of salt water (  tsp of salt per 1 qt of water) as often as every 1-2 hours to soothe your throat.   Throat lozenges (if you are not at risk for choking) or sprays may be used to soothe your throat. SEEK MEDICAL CARE IF:   You have large, tender lumps in your neck.  You have a rash.  You cough up green, yellow-brown, or bloody spit. SEEK IMMEDIATE MEDICAL CARE IF:   Your neck becomes stiff.  You drool or are unable to swallow liquids.  You vomit or are unable to keep medicines or liquids down.  You have severe pain that does not go away with the use of recommended medicines.  You have trouble breathing (not caused by a stuffy nose). MAKE SURE YOU:   Understand these instructions.  Will watch your condition.  Will get help right away if you are not doing well or get worse.   This information is not intended to replace advice given to you by your health care provider. Make sure you discuss any questions you have with your health care provider.   Document Released: 06/05/2005 Document Revised: 03/26/2013 Document Reviewed: 02/10/2013 Elsevier Interactive Patient Education 2016 Elsevier Inc.  Rapid Strep Test Strep throat is a bacterial infection caused by the bacteria Streptococcus pyogenes. A rapid strep test is the quickest way to check if these bacteria are causing your sore throat. The test can be done at your health care provider's office. Results are usually ready in 10-20 minutes. You may have this test if you have symptoms of strep throat. These include:   A  red throat with yellow or white spots.  Neck swelling and tenderness.  Fever.  Loss of appetite.  Trouble breathing or swallowing.  Rash.  Dehydration. This test requires a sample of fluid from the back of your throat and tonsils. Your health care provider may hold down your tongue with a tongue depressor and use a swab to collect the sample.  Your health care provider may collect a second sample at the same time. The  second sample may be used for a throat culture. In a culture test, the sample is combined with a substance that encourages bacteria to grow. It takes longer to get the results of the throat culture test, but they are more accurate. They can confirm the results from a rapid strep test, or show that those results were wrong. RESULTS  It is your responsibility to obtain your test results. Ask the lab or department performing the test when and how you will get your results. Contact your health care provider to discuss any questions you have about your results.  The results of the rapid strep test will be negative or positive.  Meaning of Negative Test Results If the result of your rapid strep test is negative, then it means:   It is likely that you do not have strep throat.  A virus may be causing your sore throat. Your health care provider may do a throat culture to confirm the results of the rapid strep test. The throat culture can also identify the different strains of strep bacteria. Meaning of Positive Test Results If the result of your rapid strep test is positive, then it means:  It is likely that you do have strep throat.  You may have to take antibiotics. Your health care provider may do a throat culture to confirm the results of the rapid strep test. Strep throat usually requires a course of antibiotics.    This information is not intended to replace advice given to you by your health care provider. Make sure you discuss any questions you have with your health care provider.   Document Released: 07/13/2004 Document Revised: 06/26/2014 Document Reviewed: 09/11/2013 Elsevier Interactive Patient Education [employer] Elsevier Inc.

## 2015-05-14 NOTE — ED Provider Notes (Signed)
CSN: [id number]     Arrival date & time 05/14/15  4098 History   First MD Initiated Contact with Patient 05/14/15 [phone]     Chief Complaint  Patient presents with  . Sore Throat   (Consider location/radiation/quality/duration/timing/severity/associated sxs/prior Treatment) HPI  39 yo F w 3 day hx sore throat , increasing Took tylenol yesterday-nothing today- pain up Alst 2 days some fatigue and muscle aching Continues to smoke a pack a day Has sleep apnea- now 348 pounds ( reports down 28 lb) - new CPAP has been weight adjusted and she feels better Has kicked fast food habit Hx arthritis knees- reports difficulty walking to burn calories Known HTN Past Medical History  Diagnosis Date  . Arthritis   . Hypertension   . Sleep apnea, obstructive   . Morbid obesity with BMI of 50.0-59.9, adult (HCC)   . Tobacco abuse    Past Surgical History  Procedure Laterality Date  . Cesarean section    . Breast biospy  2013  . Miscarriage  2008  . Cholecystectomy N/A 11/17/2014    Procedure: LAPAROSCOPIC CHOLECYSTECTOMY WITH INTRAOPERATIVE CHOLANGIOGRAM;  Surgeon: Tiney Rouge III, MD;  Location: [facility] ORS;  Service: General;  Laterality: N/A;   Family History  Problem Relation Age of Onset  . Arthritis Mother   . Alcohol abuse Father   . Arthritis Father   . Hyperlipidemia Father   . Hypertension Father   . Diabetes Father   . Personality disorder Sister   . Anxiety disorder Sister   . Hypertension Brother   . Diabetes Maternal Grandmother   . Lung cancer Maternal Grandfather   . Alcohol abuse Paternal Grandmother   . Breast cancer Paternal Grandmother   . Alcohol abuse Paternal Grandfather   . Arthritis Paternal Grandfather   . Heart disease Paternal Grandfather   . Hypertension Sister    Social History  Substance Use Topics  . Smoking status: Current Every Day Smoker -- 1.00 packs/day for 20 years    Types: Cigarettes  . Smokeless tobacco: Never Used  . Alcohol Use: 0.0 oz/week     0 Standard drinks or equivalent per week     Comment: rare   OB History    No data available     Review of Systems Review of 10 systems negative for acute change except as referenced in HPI  Allergies  Review of patient's allergies indicates no known allergies.  Home Medications   Prior to Admission medications   Medication Sig Start Date End Date Taking? Authorizing Provider  famotidine (PEPCID) 20 MG tablet Take 20 mg by mouth daily as needed.     Historical Provider, MD   Meds Ordered and Administered this Visit   Medications  ibuprofen (ADVIL,MOTRIN) tablet 800 mg (800 mg Oral Given 05/14/15 1005)    BP 159/85 mmHg  Pulse 95  Temp(Src) 97.6 F (36.4 C) (Tympanic)  Resp 18  Ht  (1.702 m)  Wt 348 lb (157.852 kg)  BMI 54.49 kg/m2  SpO2 96%  LMP 05/03/2015 (Exact Date) No data found.   Physical Exam  General: NAD, does not appear toxic, morbidly obese, no acute distress- positive dysphagia HEENT:normocephalic,atraumatic, mucous membranes moist,positive erythema and exudate, Stat Strep done Eyes: EOMI, conjunctiva clear, conjugate gaze Neck: supple,mild bilat lymphadenopathy, no posterior chain Resp : CT A, bilat; normal respiratory effort Card : RRR Abd:  Not distended, grossly obese- no organomegaly appreciated Skin: no rash, skin intact, no rash MSK: no deformities, ambulatory without assistance Neuro :  good attention,recall-good memory, no focal neuro deficits Psych: speech and behavior appropriate    ED Course  Procedures (including critical care time)  Labs Review Labs Reviewed  RAP[facility]EN [facility])  CULTURE, GROUP A STREP ([facility] ONLY)    Imaging Review No results found.  Discussed continued efforts at exercise Pleased with CPAP adjustment and better rest Continued calorie monitoring Any walking is good walking !    MDM   1. Pharyngitis    Plan: Test results and diagnosis reviewed with patient 3 day call back  requested  for Strep final Rx as per orders;  benefits, risks, potential side effects reviewed   Recommend supportive treatment with cyclic tylenol and ibuprofen- gargles-rest-hydration Seek additional medical care if symptoms worsen or are not improving     Rae HalstedLaurie W Lee, PA-C 05/17/15 1034   Addendum - 3 day Strep positive- called patient. She is feeling better but throat still painful. She had had message from nursing staff that [facility] but not there. I called [facility] [phone]  And ordered PCN VK 500mg  #20 BID x 10 days. Patient advised.  Also noted- she is very pleased with smoking reduction and walking techniques we had discussed at visit and has initiated -happy.  Rae HalstedLaurie W Lee, PA-C 05/17/15 1057

## 2015-05-14 NOTE — ED Notes (Signed)
Patient c/o sore throat since Wed.  Patient denies fevers  

## 2015-05-17 ENCOUNTER — Encounter: Payer: Self-pay | Admitting: Physician Assistant

## 2015-05-17 LAB — CULTURE, GROUP A STREP (THRC)

## 2015-05-19 ENCOUNTER — Encounter: Payer: Self-pay | Admitting: Adult Health

## 2015-11-29 ENCOUNTER — Ambulatory Visit (INDEPENDENT_AMBULATORY_CARE_PROVIDER_SITE_OTHER)
Admission: RE | Admit: 2015-11-29 | Discharge: 2015-11-29 | Disposition: A | Discharge status: Home / Self Care | Source: Ambulatory Visit | Attending: Family Medicine | Admitting: Family Medicine

## 2015-11-29 ENCOUNTER — Encounter: Payer: Self-pay | Admitting: Family Medicine

## 2015-11-29 ENCOUNTER — Ambulatory Visit (INDEPENDENT_AMBULATORY_CARE_PROVIDER_SITE_OTHER): Admitting: Family Medicine

## 2015-11-29 VITALS — BP 168/92 | HR 112 | Temp 98.1°F | Ht 66.0 in | Wt 340.0 lb

## 2015-11-29 DIAGNOSIS — M25562 Pain in left knee: Secondary | ICD-10-CM | POA: Diagnosis not present

## 2015-11-29 DIAGNOSIS — M129 Arthropathy, unspecified: Secondary | ICD-10-CM | POA: Diagnosis not present

## 2015-11-29 DIAGNOSIS — M1712 Unilateral primary osteoarthritis, left knee: Secondary | ICD-10-CM

## 2015-11-29 MED ORDER — TRAMADOL HCL 50 MG PO TABS: 50.0000 mg | ORAL_TABLET | Freq: Four times a day (QID) | ORAL | Status: DC | PRN

## 2015-11-29 MED ORDER — DICLOFENAC SODIUM 75 MG PO TBEC: 75.0000 mg | DELAYED_RELEASE_TABLET | Freq: Two times a day (BID) | ORAL | Status: DC

## 2015-11-29 NOTE — Progress Notes (Signed)
Dr. Karleen Hampshire T. Melis Trochez, MD, CAQ Sports Medicine [facility] [address] [place], [address] Phone: [phone] Fax: [phone]  11/29/2015  Patient: Andrea Hull, MRN: [id number], DOB: [birth year], 40 y.o.  Primary Physician:  Hannah Beat, MD   Chief Complaint  Patient presents with  . Knee Pain    Left-Started Saturday Morning   Subjective:   Andrea Hull is a 40 y.o. very pleasant female patient who presents with the following:  Sat AM woke up andwoke up in significant pain. Sat in some small chairs, walking Friday no probs. At least a year since it has been bad, sometimes has to wait until loosens up. She has no known aggravating injury. No known prior history of injury to this knee and no known prior operative intervention.  She did state and some small chairs at her son's school on Friday. Aside from this she can think of no potential aggravating problem.  No known history of gout, but her father does have a very significant history of gout.  No issues recently. Body mass index is 54.9 kg/(m^2).   Past Medical History, Surgical History, Social History, Family History, Problem List, Medications, and Allergies have been reviewed and updated if relevant.  Patient Active Problem List   Diagnosis Date Noted  . UTI (urinary tract infection) 02/26/2015  . Acute gallstone pancreatitis 11/17/2014  . Morbid obesity with BMI of 50.0-59.9, adult (HCC) 04/15/2013  . Sleep apnea, obstructive   . Tobacco abuse   . Hypertension   . Arthritis     Past Medical History  Diagnosis Date  . Arthritis   . Hypertension   . Sleep apnea, obstructive   . Morbid obesity with BMI of 50.0-59.9, adult (HCC)   . Tobacco abuse     Past Surgical History  Procedure Laterality Date  . Cesarean section    . Breast biospy  2013  . Miscarriage  2008  . Cholecystectomy N/A 11/17/2014    Procedure: LAPAROSCOPIC CHOLECYSTECTOMY WITH INTRAOPERATIVE  CHOLANGIOGRAM;  Surgeon: Tiney Rouge III, MD;  Location: [facility] ORS;  Service: General;  Laterality: N/A;    Social History   Social History  . Marital Status: Married    Spouse Name: N/A  . Number of Children: N/A  . Years of Education: N/A   Occupational History  . Not on file.   Social History Main Topics  . Smoking status: Current Every Day Smoker -- 1.00 packs/day for 20 years    Types: Cigarettes  . Smokeless tobacco: Never Used  . Alcohol Use: 0.0 oz/week    0 Standard drinks or equivalent per week     Comment: rare  . Drug Use: No  . Sexual Activity: Not on file   Other Topics Concern  . Not on file   Social History Narrative    Family History  Problem Relation Age of Onset  . Arthritis Mother   . Alcohol abuse Father   . Arthritis Father   . Hyperlipidemia Father   . Hypertension Father   . Diabetes Father   . Personality disorder Sister   . Anxiety disorder Sister   . Hypertension Brother   . Diabetes Maternal Grandmother   . Lung cancer Maternal Grandfather   . Alcohol abuse Paternal Grandmother   . Breast cancer Paternal Grandmother   . Alcohol abuse Paternal Grandfather   . Arthritis Paternal Grandfather   . Heart disease Paternal Grandfather   . Hypertension Sister  No Known Allergies  Medication list reviewed and updated in full in [facility].  GEN: No fevers, chills. Nontoxic. Primarily MSK c/o today. MSK: Detailed in the HPI GI: tolerating PO intake without difficulty Neuro: No numbness, parasthesias, or tingling associated. Otherwise the pertinent positives of the ROS are noted above.   Objective:   BP 168/92 mmHg  Pulse 112  Temp(Src) 98.1 F (36.7 C) (Oral)  Ht 5\' 6"  (1.676 m)  Wt 340 lb (154.223 kg)  BMI 54.90 kg/m2  LMP 10/27/2015   GEN: WDWN, NAD, Non-toxic, Alert & Oriented x 3 HEENT: Atraumatic, Normocephalic.  Ears and Nose: No external deformity. EXTR: No clubbing/cyanosis/edema NEURO: Normal gait.  LIMPING PSYCH: Normally interactive. Conversant. Not depressed or anxious appearing.  Calm demeanor.    Exam is somewhat limited secondary to pain. Left knee lacks 8 of extension. Flexion to 100. Nontender at the patellar tendon and at the distal tibia and fibula. Tender along the medial joint line greater than the lateral joint line. Anterior cruciate ligament appears intact. PCL was intact. MCL and LCL intact. Pain with McMurray's. Mild effusion. Other exam is equivocal.  Radiology: Dg Knee Ap/lat W/sunrise Left  11/29/2015  CLINICAL DATA:  Left knee pain, no injury. EXAM: LEFT KNEE 3 VIEWS COMPARISON:  None. FINDINGS: Medial joint space narrowing and subchondral sclerosis. Tricompartment osteophytosis. Peaking of the tibial spines. There may be a joint effusion. Difficult to exclude a small joint effusion. Slight lateral subluxation of the tibia with respect to the distal femur. IMPRESSION: 1. Tricompartment osteophytosis with medial joint space narrowing. 2. Difficult to exclude a small joint effusion. Electronically Signed   By: Leanna BattlesMelinda  Blietz M.D.   On: 11/29/2015 15:11    The radiological images were independently reviewed by myself in the office and results were reviewed with the patient. My independent interpretation of images:  Advanced arthritis for age. Tricompartmental arthritis, most notable in the medial compartment. Significant osteophytosis. Electronically Signed  By: Hannah BeatSpencer Thais Silberstein, MD On: 11/29/2015 5:20 PM    Assessment and Plan:   Left knee pain - Plan: DG Knee AP/LAT W/Sunrise Left  Arthritis of left knee  Voltaren by mouth twice a day, tramadol as needed for pain. Recommended knee brace, but at this point the patient would like to go without.  Arthritis is more advanced than expected at 40 years old. Body mass index is 54.9 kg/(m^2).  Begin with conservative management, if she is still symptomatic in 2 weeks, follow-up, consider intra-articular injection at that  point.  New Prescriptions   DICLOFENAC (VOLTAREN) 75 MG EC TABLET    Take 1 tablet (75 mg total) by mouth 2 (two) times daily.   TRAMADOL (ULTRAM) 50 MG TABLET    Take 1 tablet (50 mg total) by mouth every 6 (six) hours as needed.   Orders Placed This Encounter  Procedures  . DG Knee AP/LAT W/Sunrise Left    Signed,  Willoughby Doell T. Naiomi Musto, MD   Patient's Medications  New Prescriptions   DICLOFENAC (VOLTAREN) 75 MG EC TABLET    Take 1 tablet (75 mg total) by mouth 2 (two) times daily.   TRAMADOL (ULTRAM) 50 MG TABLET    Take 1 tablet (50 mg total) by mouth every 6 (six) hours as needed.  Previous Medications   FAMOTIDINE (PEPCID) 20 MG TABLET    Take 20 mg by mouth daily as needed.   Modified Medications   No medications on file  Discontinued Medications   No medications on file

## 2015-11-29 NOTE — Progress Notes (Signed)
Pre visit review using our clinic review tool, if applicable. No additional management support is needed unless otherwise documented below in the visit note. 

## 2016-02-17 ENCOUNTER — Encounter: Admitting: Obstetrics and Gynecology

## 2016-02-17 ENCOUNTER — Encounter: Payer: Self-pay | Admitting: Obstetrics and Gynecology

## 2016-02-17 ENCOUNTER — Ambulatory Visit (INDEPENDENT_AMBULATORY_CARE_PROVIDER_SITE_OTHER): Admitting: Obstetrics and Gynecology

## 2016-02-17 VITALS — BP 165/9 | HR 82 | Ht 67.0 in | Wt 332.5 lb

## 2016-02-17 DIAGNOSIS — Z6841 Body Mass Index (BMI) 40.0 and over, adult: Secondary | ICD-10-CM

## 2016-02-17 DIAGNOSIS — Z1239 Encounter for other screening for malignant neoplasm of breast: Secondary | ICD-10-CM

## 2016-02-17 DIAGNOSIS — I10 Essential (primary) hypertension: Secondary | ICD-10-CM | POA: Diagnosis not present

## 2016-02-17 DIAGNOSIS — Z720 Tobacco use: Secondary | ICD-10-CM

## 2016-02-17 DIAGNOSIS — Z98891 History of uterine scar from previous surgery: Secondary | ICD-10-CM

## 2016-02-17 DIAGNOSIS — O926 Galactorrhea: Secondary | ICD-10-CM | POA: Diagnosis not present

## 2016-02-17 DIAGNOSIS — Z01419 Encounter for gynecological examination (general) (routine) without abnormal findings: Secondary | ICD-10-CM

## 2016-02-17 DIAGNOSIS — N643 Galactorrhea not associated with childbirth: Secondary | ICD-10-CM

## 2016-02-17 NOTE — Patient Instructions (Signed)
1 Pap smear. 2. Mammogram ordered 3. Healthy eating and exercise with weight loss encouraged 4. Screening labs-through PCP 5. Smoking cessation strongly encouraged 6. Return in 1 year

## 2016-02-17 NOTE — Addendum Note (Signed)
Addended by: Sharon SellerEFRANCESCO, Lindalee Huizinga on: 02/17/2016 10:57 AM   Modules accepted: Orders

## 2016-02-17 NOTE — Progress Notes (Signed)
ANNUAL PREVENTATIVE CARE GYN  ENCOUNTER NOTE  Subjective:       Andrea Hull is a 40 y.o. G62P1011 female here for a routine annual gynecologic exam.  Current complaints: 1.  none   Hasn't seen a gynecologist in 8 years.  Major health issues include hypertension, obesity, and tobacco use. She has follow-up with her primary care regarding blood pressure next week. She has lost 30 pounds in past 4 months is working reducing her weight. She is decreasing her tobacco intake and is now smoking approximately three quarters pack of cigarettes per day. 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  Past gynecological history: Menarche-12 Intervals-30-37 days Duration-5-6 days Dysmenorrhea-negative Dyspareunia-negative History of abnormal Pap smears-negative; last Pap smear 8 years ago No history of mammograms No history of STI's  Patient does report nipple discharge bilaterally, like colostrum Patient does not have chronic Patient does not have significant visual changes    Gynecologic History Patient's last menstrual period was 01/23/2016 (exact date). Contraception: coitus interruptus Last Pap: 2008. Results were: normal Last mammogram: never.  Obstetric History OB History  Gravida Para Term Preterm AB Living  2 1 1   1 1   SAB TAB Ectopic Multiple Live Births  1       1    # Outcome Date GA Lbr Len/2nd Weight Sex Delivery Anes PTL Lv  2 SAB 2008          1 Term 2004   9 lb 4.8 oz (4.218 kg) M CS-LTranv   LIV      Past Medical History:  Diagnosis Date  . Arthritis   . Hypertension   . Morbid obesity with BMI of 50.0-59.9, adult ([facility])   . Sleep apnea, obstructive   . Tobacco abuse     Past Surgical History:  Procedure Laterality Date  . Breast Biospy  2013  . CESAREAN SECTION    . CHOLECYSTECTOMY N/A 11/17/2014   Procedure: LAPAROSCOPIC CHOLECYSTECTOMY WITH INTRAOPERATIVE CHOLANGIOGRAM;  Surgeon: Tiney Rouge III, MD;  Location: [facility] ORS;  Service: General;  Laterality: N/A;  .  DILATION AND CURETTAGE OF UTERUS    . Miscarriage  2008    Current Outpatient Prescriptions on File Prior to Visit  Medication Sig Dispense Refill  . diclofenac (VOLTAREN) 75 MG EC tablet Take 1 tablet (75 mg total) by mouth 2 (two) times daily. 60 tablet 3  . famotidine (PEPCID) 20 MG tablet Take 20 mg by mouth daily as needed.     . traMADol (ULTRAM) 50 MG tablet Take 1 tablet (50 mg total) by mouth every 6 (six) hours as needed. 50 tablet 2   No current facility-administered medications on file prior to visit.     No Known Allergies  Social History   Social History  . Marital status: Married    Spouse name: N/A  . Number of children: N/A  . Years of education: N/A   Occupational History  . Not on file.   Social History Main Topics  . Smoking status: Current Every Day Smoker    Packs/day: 1.00    Years: 20.00    Types: Cigarettes  . Smokeless tobacco: Never Used  . Alcohol use 0.0 oz/week     Comment: rare  . Drug use: No  . Sexual activity: Not on file   Other Topics Concern  . Not on file   Social History Narrative  . No narrative on file    Family History  Problem Relation Age of Onset  . Arthritis  Mother   . Alcohol abuse Father   . Arthritis Father   . Hyperlipidemia Father   . Hypertension Father   . Diabetes Father   . Personality disorder Sister   . Anxiety disorder Sister   . Diabetes Maternal Grandmother   . Lung cancer Maternal Grandfather   . Alcohol abuse Paternal Grandmother   . Breast cancer Paternal Grandmother   . Alcohol abuse Paternal Grandfather   . Arthritis Paternal Grandfather   . Heart disease Paternal Grandfather   . Hypertension Sister   . Hypertension Brother     The following portions of the patient's history were reviewed and updated as appropriate: allergies, current medications, past family history, past medical history, past social history, past surgical history and problem list.  Review of Systems ROS Review of  Systems - General ROS: negative for - chills, fatigue, fever, hot flashes, night sweats, weight gain or weight loss Psychological ROS: negative for - anxiety, decreased libido, depression, mood swings, physical abuse or sexual abuse Ophthalmic ROS: negative for - blurry vision, eye pain or loss of vision ENT ROS: negative for - headaches, hearing change, visual changes or vocal changes Allergy and Immunology ROS: negative for - hives, itchy/watery eyes or seasonal allergies Hematological and Lymphatic ROS: negative for - bleeding problems, bruising, swollen lymph nodes or weight loss Endocrine ROS: negative for - galactorrhea, hair pattern changes, hot flashes, malaise/lethargy, mood swings, palpitations, polydipsia/polyuria, skin changes, temperature intolerance or unexpected weight changes Breast ROS: negative for - new or changing breast lumps or nipple discharge Respiratory ROS: negative for - cough or shortness of breath Cardiovascular ROS: negative for - chest pain, irregular heartbeat, palpitations or shortness of breath Gastrointestinal ROS: no abdominal pain, change in bowel habits, or black or bloody stools Genito-Urinary ROS: no dysuria, trouble voiding, or hematuria Musculoskeletal ROS: negative for - joint pain or joint stiffness Neurological ROS: negative for - bowel and bladder control changes Dermatological ROS: negative for rash and skin lesion changes   Objective:   BP (!) 171/102   Pulse 90   Ht 5\' 7"  (1.702 m)   Wt (!) 332 lb 8 oz (150.8 kg)   LMP 01/23/2016 (Exact Date)   BMI 52.08 kg/m  CONSTITUTIONAL: Well-developed, well-nourished female in no acute distress.  PSYCHIATRIC: Normal mood and affect. Normal behavior. Normal judgment and thought content. NEUROLGIC: Alert and oriented to person, place, and time. Normal muscle tone coordination. No cranial nerve deficit noted. HENT:  Normocephalic, atraumatic, External right and left ear normal. Oropharynx is clear and  moist EYES: Conjunctivae and EOM are normal. Pupils are equal, round, and reactive to light. No scleral icterus.  NECK: Normal range of motion, supple, no masses.  Normal thyroid.  SKIN: Skin is warm and dry. No rash noted. Not diaphoretic. No erythema. No pallor. CARDIOVASCULAR: Normal heart rate noted, regular rhythm, no murmur. RESPIRATORY: Clear to auscultation bilaterally. Effort and breath sounds normal, no problems with respiration noted. BREASTS: Symmetric in size. No masses, skin changes, nipple drainage, or lymphadenopathy. ABDOMEN: Soft, normal bowel sounds, no distention noted.  No tenderness, rebound or guarding.  BLADDER: Normal PELVIC:  External Genitalia: Normal  BUS: Normal  Vagina: Normal  Cervix: Normal; No cervical motion tenderness; no lesions  Uterus: Mid plane; nonenlarged; difficult to palpate due to body habitus  Adnexa: Normal; nonpalpable and nontender  RV: External hemorrhoids, No Rectal Masses and Normal Sphincter tone  MUSCULOSKELETAL: Normal range of motion. No tenderness.  No cyanosis, clubbing, or edema.  2+  distal pulses. LYMPHATIC: No Axillary, Supraclavicular, or Inguinal Adenopathy.    Assessment:   Annual gynecologic examination 40 y.o. Contraception: coitus interruptus [id number] Problem List Items Addressed This Visit    None    Visit Diagnoses   None.   Galactorrhea  Plan:  Pap: Pap Co Test Mammogram: Ordered Stool Guaiac Testing:  Not Indicated Labs: thru pcp Will order a prolactin level and TSH today  Routine preventative health maintenance measures emphasized: Exercise/Diet/Weight control, Tobacco Warnings, Alcohol/Substance use risks and Safe Sex smoking cessation strongly encouraged Slow steady weight loss strongly encouraged  Return to Clinic - 1 Year   Etan Vasudevan Sugar GroveMiller, CMA  Herold HarmsMartin A Defrancesco, MD  Note: This dictation was prepared with Dragon dictation along with smaller phrase technology. Any transcriptional errors that  result from this process are unintentional.

## 2016-02-18 LAB — TSH: TSH: 1.48 u[IU]/mL (ref 0.450–4.500)

## 2016-02-18 LAB — PROLACTIN: Prolactin: 12 ng/mL (ref 4.8–23.3)

## 2016-02-22 ENCOUNTER — Other Ambulatory Visit: Payer: Self-pay | Admitting: Family Medicine

## 2016-02-22 DIAGNOSIS — E785 Hyperlipidemia, unspecified: Secondary | ICD-10-CM

## 2016-02-22 DIAGNOSIS — R5383 Other fatigue: Secondary | ICD-10-CM

## 2016-02-22 LAB — PAP IG AND HPV HIGH-RISK
HPV, HIGH-RISK: NEGATIVE
PAP SMEAR COMMENT: 0

## 2016-02-25 ENCOUNTER — Other Ambulatory Visit (INDEPENDENT_AMBULATORY_CARE_PROVIDER_SITE_OTHER)

## 2016-02-25 DIAGNOSIS — R5383 Other fatigue: Secondary | ICD-10-CM | POA: Diagnosis not present

## 2016-02-25 DIAGNOSIS — E785 Hyperlipidemia, unspecified: Secondary | ICD-10-CM | POA: Diagnosis not present

## 2016-02-25 LAB — HEPATIC FUNCTION PANEL
ALK PHOS: 68 U/L (ref 39–117)
ALT: 73 U/L — AB (ref 0–35)
AST: 36 U/L (ref 0–37)
Albumin: 3.9 g/dL (ref 3.5–5.2)
BILIRUBIN DIRECT: 0.1 mg/dL (ref 0.0–0.3)
BILIRUBIN TOTAL: 0.5 mg/dL (ref 0.2–1.2)
TOTAL PROTEIN: 6.9 g/dL (ref 6.0–8.3)

## 2016-02-25 LAB — BASIC METABOLIC PANEL
BUN: 17 mg/dL (ref 6–23)
CO2: 28 mEq/L (ref 19–32)
Calcium: 8.9 mg/dL (ref 8.4–10.5)
Chloride: 103 mEq/L (ref 96–112)
Creatinine, Ser: 0.83 mg/dL (ref 0.40–1.20)
GFR: 80.82 mL/min (ref >60.00)
Glucose, Bld: 95 mg/dL (ref 70–99)
Potassium: 4.3 mEq/L (ref 3.5–5.1)
Sodium: 136 mEq/L (ref 135–145)

## 2016-02-25 LAB — CBC WITH DIFFERENTIAL/PLATELET
BASOS ABS: 0.1 10*3/uL (ref 0.0–0.1)
Basophils Relative: 0.8 % (ref 0.0–3.0)
EOS ABS: 0.4 10*3/uL (ref 0.0–0.7)
Eosinophils Relative: 2.8 % (ref 0.0–5.0)
HCT: 40.9 % (ref 36.0–46.0)
HEMOGLOBIN: 13.9 g/dL (ref 12.0–15.0)
LYMPHS ABS: 3.5 10*3/uL (ref 0.7–4.0)
Lymphocytes Relative: 25.1 % (ref 12.0–46.0)
MCHC: 34 g/dL (ref 30.0–36.0)
MCV: 90.7 fl (ref 78.0–100.0)
MONO ABS: 0.8 10*3/uL (ref 0.1–1.0)
Monocytes Relative: 6.1 % (ref 3.0–12.0)
NEUTROS PCT: 65.2 % (ref 43.0–77.0)
Neutro Abs: 9 10*3/uL — ABNORMAL HIGH (ref 1.4–7.7)
Platelets: 310 10*3/uL (ref 150.0–400.0)
RBC: 4.51 Mil/uL (ref 3.87–5.11)
RDW: 15 % (ref 11.5–15.5)
WBC: 13.8 10*3/uL — AB (ref 4.0–10.5)

## 2016-02-25 LAB — LIPID PANEL
CHOLESTEROL: 202 mg/dL — AB (ref 0–200)
HDL: 35.9 mg/dL — ABNORMAL LOW (ref >39.00)
NonHDL: 166.45
Total CHOL/HDL Ratio: 6
Triglycerides: 215 mg/dL — ABNORMAL HIGH (ref 0.0–149.0)
VLDL: 43 mg/dL — AB (ref 0.0–40.0)

## 2016-02-25 LAB — LDL CHOLESTEROL, DIRECT: LDL DIRECT: 131 mg/dL

## 2016-02-25 NOTE — Addendum Note (Signed)
Addended by: Baldomero LamyHAVERS, NATASHA C on: 02/25/2016 09:26 AM   Modules accepted: Orders

## 2016-03-01 ENCOUNTER — Ambulatory Visit (INDEPENDENT_AMBULATORY_CARE_PROVIDER_SITE_OTHER): Admitting: Family Medicine

## 2016-03-01 ENCOUNTER — Encounter: Payer: Self-pay | Admitting: Family Medicine

## 2016-03-01 VITALS — BP 158/90 | HR 100 | Temp 98.7°F | Ht 66.0 in | Wt 331.5 lb

## 2016-03-01 DIAGNOSIS — Z0000 Encounter for general adult medical examination without abnormal findings: Secondary | ICD-10-CM

## 2016-03-01 DIAGNOSIS — Z23 Encounter for immunization: Secondary | ICD-10-CM

## 2016-03-01 NOTE — Progress Notes (Signed)
Dr. Frederico Hamman T. Khira Cudmore, MD, [facility] Medicine [place], [address] Phone: [phone] Fax: [phone]  03/01/2016  Patient: Andrea Hull, MRN: [id number], DOB: [birth year], 40 y.o.  Primary Physician:  Owens Loffler, MD   Chief Complaint  Patient presents with  . Annual Exam   Subjective:   Andrea Hull is a 40 y.o. pleasant patient who presents with the following:  Health Maintenance Summary Reviewed and updated, unless pt declines services.  Tobacco History Reviewed. Non-smoker Alcohol: No concerns, no excessive use Exercise Habits: Some activity, rec at least 30 mins 5 times a week STD concerns: none Drug Use: None Menses regular: yes Lumps or breast concerns: no Breast Cancer Family History: no  HTN: wants to cont to try to lose weight, lost 30 pounds thus far.  Health Maintenance  Topic Date Due  . HIV Screening  11/26/1990  . TETANUS/TDAP  03/01/2026  . INFLUENZA VACCINE  Completed    Immunization History  Administered Date(s) Administered  . Influenza,inj,Quad PF,36+ Mos 04/14/2013, 03/27/2014, 04/07/2015, 03/01/2016  . Tdap 03/01/2016   Patient Active Problem List   Diagnosis Date Noted  . Morbid obesity with BMI of 50.0-59.9, adult ([facility]) 04/15/2013  . Sleep apnea, obstructive   . Tobacco abuse   . Hypertension   . Arthritis    Past Medical History:  Diagnosis Date  . Arthritis   . Heavy periods   . Hypertension   . Morbid obesity with BMI of 50.0-59.9, adult ([facility])   . Painful menstrual periods    h/o  . Sleep apnea, obstructive   . Tobacco abuse    Past Surgical History:  Procedure Laterality Date  . Breast Biospy  2013  . CESAREAN SECTION    . CHOLECYSTECTOMY N/A 11/17/2014   Procedure: LAPAROSCOPIC CHOLECYSTECTOMY WITH INTRAOPERATIVE CHOLANGIOGRAM;  Surgeon: Dia Crawford III, MD;  Location: [facility] ORS;  Service: General;  Laterality: N/A;  . DILATION AND CURETTAGE OF UTERUS    .  Miscarriage  2008   Social History   Social History  . Marital status: Married    Spouse name: N/A  . Number of children: N/A  . Years of education: N/A   Occupational History  . Not on file.   Social History Main Topics  . Smoking status: Current Every Day Smoker    Packs/day: 1.00    Years: 20.00    Types: Cigarettes  . Smokeless tobacco: Never Used  . Alcohol use 0.0 oz/week     Comment: rare  . Drug use: No  . Sexual activity: Yes    Birth control/ protection: Coitus interruptus   Other Topics Concern  . Not on file   Social History Narrative  . No narrative on file   Family History  Problem Relation Age of Onset  . Arthritis Mother   . Alcohol abuse Father   . Arthritis Father   . Hyperlipidemia Father   . Hypertension Father   . Diabetes Father   . Personality disorder Sister   . Anxiety disorder Sister   . Diabetes Maternal Grandmother   . Lung cancer Maternal Grandfather   . Alcohol abuse Paternal Grandmother   . Breast cancer Paternal Grandmother   . Alcohol abuse Paternal Grandfather   . Arthritis Paternal Grandfather   . Heart disease Paternal Grandfather   . Hypertension Sister   . Hypertension Brother   . Ovarian cancer Neg Hx   . Colon cancer Neg Hx  No Known Allergies  Medication list has been reviewed and updated.   General: Denies fever, chills, sweats. No significant weight loss. Eyes: Denies blurring,significant itching ENT: Denies earache, sore throat, and hoarseness.  Cardiovascular: Denies chest pains, palpitations, dyspnea on exertion,  Respiratory: Denies cough, dyspnea at rest,wheeezing Breast: no concerns about lumps GI: Denies nausea, vomiting, diarrhea, constipation, change in bowel habits, abdominal pain, melena, hematochezia GU: Denies dysuria, hematuria, urinary hesitancy, nocturia, denies STD risk, no concerns about discharge Musculoskeletal: Denies back pain, joint pain Derm: Denies rash, itching Neuro: Denies   paresthesias, frequent falls, frequent headaches Psych: Denies depression, anxiety Endocrine: Denies cold intolerance, heat intolerance, polydipsia Heme: Denies enlarged lymph nodes Allergy: No hayfever  Objective:   BP (!) 158/90   Pulse 100   Temp 98.7 F (37.1 C) (Oral)   Ht 5' 6"  (1.676 m)   Wt (!) 331 lb 8 oz (150.4 kg)   LMP 02/19/2016   BMI 53.51 kg/m  No exam data present  GEN: well developed, well nourished, no acute distress Eyes: conjunctiva and lids normal, PERRLA, EOMI ENT: TM clear, nares clear, oral exam WNL Neck: supple, no lymphadenopathy, no thyromegaly, no JVD Pulm: clear to auscultation and percussion, respiratory effort normal CV: regular rate and rhythm, S1-S2, no murmur, rub or gallop, no bruits Chest: no scars, masses, no lumps BREAST: breast exam declined GI: soft, non-tender; no hepatosplenomegaly, masses; active bowel sounds all quadrants GU: GU exam declined Lymph: no cervical, axillary or inguinal adenopathy MSK: gait normal, muscle tone and strength WNL, no joint swelling, effusions, discoloration, crepitus  SKIN: clear, good turgor, color WNL, no rashes, lesions, or ulcerations Neuro: normal mental status, normal strength, sensation, and motion Psych: alert; oriented to person, place and time, normally interactive and not anxious or depressed in appearance.   All labs reviewed with patient. Lipids:    Component Value Date/Time   CHOL 202 (H) 02/25/2016 0926   TRIG 215.0 (H) 02/25/2016 0926   HDL 35.90 (L) 02/25/2016 0926   LDLDIRECT 131.0 02/25/2016 0926   VLDL 43.0 (H) 02/25/2016 0926   CHOLHDL 6 02/25/2016 0926   CBC: CBC Latest Ref Rng & Units 02/25/2016 11/20/2014 11/17/2014  WBC 4.0 - 10.5 K/uL 13.8(H) 13.7(H) 14.8(H)  Hemoglobin 12.0 - 15.0 g/dL 13.9 12.9 12.5  Hematocrit 36.0 - 46.0 % 40.9 38.6 37.8  Platelets 150.0 - 400.0 K/uL 310.0 369.0 341    Basic Metabolic Panel:    Component Value Date/Time   NA 136 02/25/2016 0926    K 4.3 02/25/2016 0926   CL 103 02/25/2016 0926   CO2 28 02/25/2016 0926   BUN 17 02/25/2016 0926   CREATININE 0.83 02/25/2016 0926   GLUCOSE 95 02/25/2016 0926   CALCIUM 8.9 02/25/2016 0926   Hepatic Function Latest Ref Rng & Units 02/25/2016 11/20/2014 11/17/2014  Total Protein 6.0 - 8.3 g/dL 6.9 6.9 6.8  Albumin 3.5 - 5.2 g/dL 3.9 3.9 3.5  AST 0 - 37 U/L 36 76(H) 54(H)  ALT 0 - 35 U/L 73(H) 160(H) 182(H)  Alk Phosphatase 39 - 117 U/L 68 149(H) 139(H)  Total Bilirubin 0.2 - 1.2 mg/dL 0.5 0.7 0.9  Bilirubin, Direct 0.0 - 0.3 mg/dL 0.1 0.2 -    Lab Results  Component Value Date   TSH 1.480 02/17/2016   No results found.  Assessment and Plan:   Healthcare maintenance  Need for prophylactic vaccination with combined diphtheria-tetanus-pertussis (DTP) vaccine - Plan: Tdap vaccine greater than or equal to 7yo IM  Need  for prophylactic vaccination and inoculation against influenza - Plan: Flu Vaccine QUAD 36+ mos IM  Health Maintenance Exam: The patient's preventative maintenance and recommended screening tests for an annual wellness exam were reviewed in full today. Brought up to date unless services declined.  Counselled on the importance of diet, exercise, and its role in overall health and mortality. The patient's FH and SH was reviewed, including their home life, tobacco status, and drug and alcohol status.  Follow-up: Return in about 6 months (around 08/29/2016). Or follow-up in 1 year for complete physical examination  Orders Placed This Encounter  Procedures  . Flu Vaccine QUAD 36+ mos IM  . Tdap vaccine greater than or equal to 7yo IM    Signed,  Liddie Chichester T. Jamyron Redd, MD   Patient's Medications  New Prescriptions   No medications on file  Previous Medications   DICLOFENAC (VOLTAREN) 75 MG EC TABLET    Take 1 tablet (75 mg total) by mouth 2 (two) times daily.   FAMOTIDINE (PEPCID) 20 MG TABLET    Take 20 mg by mouth daily as needed.    TRAMADOL (ULTRAM) 50 MG  TABLET    Take 1 tablet (50 mg total) by mouth every 6 (six) hours as needed.  Modified Medications   No medications on file  Discontinued Medications   No medications on file

## 2016-03-01 NOTE — Progress Notes (Signed)
Pre visit review using our clinic review tool, if applicable. No additional management support is needed unless otherwise documented below in the visit note. 

## 2016-03-15 ENCOUNTER — Encounter: Payer: Self-pay | Admitting: Family Medicine

## 2016-03-15 ENCOUNTER — Ambulatory Visit (INDEPENDENT_AMBULATORY_CARE_PROVIDER_SITE_OTHER): Admitting: Family Medicine

## 2016-03-15 VITALS — BP 162/90 | HR 101 | Temp 98.3°F | Ht 66.0 in | Wt 330.2 lb

## 2016-03-15 DIAGNOSIS — M5412 Radiculopathy, cervical region: Secondary | ICD-10-CM

## 2016-03-15 MED ORDER — AMITRIPTYLINE HCL 25 MG PO TABS: 25.0000 mg | ORAL_TABLET | Freq: Every day | ORAL | 1 refills | Status: DC

## 2016-03-15 MED ORDER — PREDNISONE 20 MG PO TABS: ORAL_TABLET | ORAL | 0 refills | Status: DC

## 2016-03-15 NOTE — Progress Notes (Signed)
Pre visit review using our clinic review tool, if applicable. No additional management support is needed unless otherwise documented below in the visit note. 

## 2016-03-15 NOTE — Progress Notes (Signed)
Dr. Karleen Hampshire T. Lendy Dittrich, MD, [facility] Primary [facility] and Sports Medicine [address] [place], [address] Phone: [phone] Fax: [phone]  03/15/2016  Patient: Andrea Hull, MRN: [id number], DOB: 1975/07/05, 40 y.o.  Primary Physician:  Hannah Beat, MD   Chief Complaint  Patient presents with  . Shoulder Pain    Right   Subjective:   Andrea Hull is a 39 y.o. very pleasant female patient who presents with the following:  R shoulder blade pain and radiating down her R arm.   She has been having pain in her shoulder blade on the right for about the last month. She also has pain goes down her arm and has been intermittently going on for the last 4 weeks. This is exacerbated by movements at the neck. She has not had any trauma or known injury. His had a problem with some radicular symptoms in the past several years ago. She was treated with prednisone and amitriptyline.  Past Medical History, Surgical History, Social History, Family History, Problem List, Medications, and Allergies have been reviewed and updated if relevant.  Patient Active Problem List   Diagnosis Date Noted  . Morbid obesity with BMI of 50.0-59.9, adult ([facility]) 04/15/2013  . Sleep apnea, obstructive   . Tobacco abuse   . Hypertension   . Arthritis     Past Medical History:  Diagnosis Date  . Arthritis   . Heavy periods   . Hypertension   . Morbid obesity with BMI of 50.0-59.9, adult ([facility])   . Painful menstrual periods    h/o  . Sleep apnea, obstructive   . Tobacco abuse     Past Surgical History:  Procedure Laterality Date  . Breast Biospy  2013  . CESAREAN SECTION    . CHOLECYSTECTOMY N/A 11/17/2014   Procedure: LAPAROSCOPIC CHOLECYSTECTOMY WITH INTRAOPERATIVE CHOLANGIOGRAM;  Surgeon: Tiney Rouge III, MD;  Location: [facility] ORS;  Service: General;  Laterality: N/A;  . DILATION AND CURETTAGE OF UTERUS    . Miscarriage  2008    Social History   Social History  . Marital  status: Married    Spouse name: N/A  . Number of children: N/A  . Years of education: N/A   Occupational History  . Not on file.   Social History Main Topics  . Smoking status: Current Every Day Smoker    Packs/day: 1.00    Years: 20.00    Types: Cigarettes  . Smokeless tobacco: Never Used  . Alcohol use 0.0 oz/week     Comment: rare  . Drug use: No  . Sexual activity: Yes    Birth control/ protection: Coitus interruptus   Other Topics Concern  . Not on file   Social History Narrative  . No narrative on file    Family History  Problem Relation Age of Onset  . Arthritis Mother   . Alcohol abuse Father   . Arthritis Father   . Hyperlipidemia Father   . Hypertension Father   . Diabetes Father   . Personality disorder Sister   . Anxiety disorder Sister   . Diabetes Maternal Grandmother   . Lung cancer Maternal Grandfather   . Alcohol abuse Paternal Grandmother   . Breast cancer Paternal Grandmother   . Alcohol abuse Paternal Grandfather   . Arthritis Paternal Grandfather   . Heart disease Paternal Grandfather   . Hypertension Sister   . Hypertension Brother   . Ovarian cancer Neg Hx   . Colon cancer  Neg Hx     No Known Allergies  Medication list reviewed and updated in full in [facility].  GEN: No fevers, chills. Nontoxic. Primarily MSK c/o today. MSK: Detailed in the HPI GI: tolerating PO intake without difficulty Neuro: No numbness, parasthesias, or tingling associated. Otherwise the pertinent positives of the ROS are noted above.   Objective:   BP (!) 162/90   Pulse (!) 101   Temp 98.3 F (36.8 C) (Oral)   Ht 5\' 6"  (1.676 m)   Wt (!) 330 lb 4 oz (149.8 kg)   LMP 02/19/2016   BMI 53.30 kg/m    GEN: WDWN, NAD, Non-toxic, Alert & Oriented x 3 HEENT: Atraumatic, Normocephalic.  Ears and Nose: No external deformity. EXTR: No clubbing/cyanosis/edema NEURO: Normal gait.  PSYCH: Normally interactive. Conversant. Not depressed or anxious  appearing.  Calm demeanor.   CERVICAL SPINE EXAM Range of motion: Flexion, extension, lateral bending, and rotation: Modest restriction with lateral rotation and lateral bending. Pain with terminal motion: Yes Spinous Processes: NT SCM: NT Upper paracervical muscles: TTP Upper traps: TTP C5-T1 intact, sensation and motor   SPURLINGS IS POS to R  Shoulder: R Inspection: No muscle wasting or winging Ecchymosis/edema: neg  AC joint, scapula, clavicle: NT Abduction: full, 5/5 Flexion: full, 5/5 IR, full, lift-off: 5/5 ER at neutral: full, 5/5 AC crossover and compression: neg Neer: neg Hawkins: neg Drop Test: neg Empty Can: neg Supraspinatus insertion: NT Bicipital groove: NT Speed's: neg Yergason's: neg Sulcus sign: neg Scapular dyskinesis: none C5-T1 intact Sensation intact Grip 5/5   Radiology: No results found.  Assessment and Plan:   Right cervical radiculopathy  Trial conservative care with pred and elavil mckenzie protocol recommended  Follow-up: No Follow-up on file.  New Prescriptions   AMITRIPTYLINE (ELAVIL) 25 MG TABLET    Take 1 tablet (25 mg total) by mouth at bedtime.   PREDNISONE (DELTASONE) 20 MG TABLET    2 tabs po for 5 days, then 1 tab for 5 days   Signed,  Loretta Doutt T. Thurston Brendlinger, MD   Patient's Medications  New Prescriptions   AMITRIPTYLINE (ELAVIL) 25 MG TABLET    Take 1 tablet (25 mg total) by mouth at bedtime.   PREDNISONE (DELTASONE) 20 MG TABLET    2 tabs po for 5 days, then 1 tab for 5 days  Previous Medications   DICLOFENAC (VOLTAREN) 75 MG EC TABLET    Take 1 tablet (75 mg total) by mouth 2 (two) times daily.   FAMOTIDINE (PEPCID) 20 MG TABLET    Take 20 mg by mouth daily as needed.    TRAMADOL (ULTRAM) 50 MG TABLET    Take 1 tablet (50 mg total) by mouth every 6 (six) hours as needed.  Modified Medications   No medications on file  Discontinued Medications   No medications on file

## 2016-05-19 ENCOUNTER — Encounter: Payer: Self-pay | Admitting: Internal Medicine

## 2016-05-19 ENCOUNTER — Ambulatory Visit (INDEPENDENT_AMBULATORY_CARE_PROVIDER_SITE_OTHER): Admitting: Internal Medicine

## 2016-05-19 VITALS — BP 160/94 | HR 87 | Temp 98.4°F | Resp 24 | Wt 345.0 lb

## 2016-05-19 DIAGNOSIS — J029 Acute pharyngitis, unspecified: Secondary | ICD-10-CM

## 2016-05-19 LAB — POCT RAPID STREP A (OFFICE): Rapid Strep A Screen: NEGATIVE

## 2016-05-19 NOTE — Assessment & Plan Note (Signed)
0/4 [facility] criteria Rapid strep negative Despite exposure--seems to be viral Discussed supportive care If worsens with the bronchitis next week, will Rx doxy or z-pak

## 2016-05-19 NOTE — Progress Notes (Signed)
Pre visit review using our clinic review tool, if applicable. No additional management support is needed unless otherwise documented below in the visit note. 

## 2016-05-19 NOTE — Addendum Note (Signed)
Addended by: Eual FinesBRIDGES, [place] Nase P on: 05/19/2016 11:43 AM   Modules accepted: Orders

## 2016-05-19 NOTE — Patient Instructions (Signed)
Please call next week if your bronchitis is worsening.

## 2016-05-19 NOTE — Progress Notes (Signed)
Subjective:    Patient ID: Andrea Hull, female    DOB: 02/24/1976, 40 y.o.   MRN: [id number]  HPI Here with sore throat  3 days of sore throat Exposed to strep in sister/niece had positive strep Household contact with them before their treatment Painful all the time--- no problems swallowing  Now with cough starting today Productive of yellowish sputum Still smoking---"I am working on it"  No ear pain No SOB No fever No night sweats or chills  nyquil and cough drops--slight help  Current Outpatient Prescriptions on File Prior to Visit  Medication Sig Dispense Refill  . amitriptyline (ELAVIL) 25 MG tablet Take 1 tablet (25 mg total) by mouth at bedtime. 30 tablet 1  . diclofenac (VOLTAREN) 75 MG EC tablet Take 1 tablet (75 mg total) by mouth 2 (two) times daily. 60 tablet 3  . famotidine (PEPCID) 20 MG tablet Take 20 mg by mouth daily as needed.      No current facility-administered medications on file prior to visit.     No Known Allergies  Past Medical History:  Diagnosis Date  . Arthritis   . Heavy periods   . Hypertension   . Morbid obesity with BMI of 50.0-59.9, adult (HCC)   . Painful menstrual periods    h/o  . Sleep apnea, obstructive   . Tobacco abuse     Past Surgical History:  Procedure Laterality Date  . Breast Biospy  2013  . CESAREAN SECTION    . CHOLECYSTECTOMY N/A 11/17/2014   Procedure: LAPAROSCOPIC CHOLECYSTECTOMY WITH INTRAOPERATIVE CHOLANGIOGRAM;  Surgeon: Tiney Rougealph Ely III, MD;  Location: [facility] ORS;  Service: General;  Laterality: N/A;  . DILATION AND CURETTAGE OF UTERUS    . Miscarriage  2008    Family History  Problem Relation Age of Onset  . Arthritis Mother   . Alcohol abuse Father   . Arthritis Father   . Hyperlipidemia Father   . Hypertension Father   . Diabetes Father   . Personality disorder Sister   . Anxiety disorder Sister   . Diabetes Maternal Grandmother   . Lung cancer Maternal Grandfather   . Alcohol abuse Paternal  Grandmother   . Breast cancer Paternal Grandmother   . Alcohol abuse Paternal Grandfather   . Arthritis Paternal Grandfather   . Heart disease Paternal Grandfather   . Hypertension Sister   . Hypertension Brother   . Ovarian cancer Neg Hx   . Colon cancer Neg Hx     Social History   Social History  . Marital status: Married    Spouse name: N/A  . Number of children: N/A  . Years of education: N/A   Occupational History  . Not on file.   Social History Main Topics  . Smoking status: Current Every Day Smoker    Packs/day: 1.00    Years: 20.00    Types: Cigarettes  . Smokeless tobacco: Never Used  . Alcohol use 0.0 oz/week     Comment: rare  . Drug use: No  . Sexual activity: Yes    Birth control/ protection: Coitus interruptus   Other Topics Concern  . Not on file   Social History Narrative  . No narrative on file   Review of Systems Stay at home mom No rash No vomiting or diarrhea Appetite is okay Working on BP with Dr Patsy Lageropland    Objective:   Physical Exam  Constitutional: She appears well-developed and well-nourished. No distress.  HENT:  No sinus tenderness  TMs normal Mild nasal inflammation Mild pharyngeal injection without exudates  Neck: Normal range of motion. Neck supple. No thyromegaly present.  Pulmonary/Chest: Effort normal and breath sounds normal. No respiratory distress. She has no wheezes. She has no rales.  Lymphadenopathy:    She has no cervical adenopathy.          Assessment & Plan:

## 2016-05-23 ENCOUNTER — Encounter: Payer: Self-pay | Admitting: Internal Medicine

## 2016-05-23 MED ORDER — AZITHROMYCIN 250 MG PO TABS: ORAL_TABLET | ORAL | 0 refills | Status: DC

## 2016-06-30 ENCOUNTER — Other Ambulatory Visit: Payer: Self-pay | Admitting: Family Medicine

## 2016-07-17 ENCOUNTER — Other Ambulatory Visit: Payer: Self-pay | Admitting: Family Medicine

## 2016-07-17 NOTE — Telephone Encounter (Signed)
Last office visit 05/19/2016 with Dr. Alphonsus SiasLetvak for sore throat.  Last refilled 11/29/2015 for #60 with 3 refills.  Ok to refill?

## 2016-07-31 ENCOUNTER — Telehealth: Payer: Self-pay | Admitting: Family Medicine

## 2016-07-31 NOTE — Telephone Encounter (Signed)
[facility] - Client TELEPHONE ADVICE RECORD [facility] Patient Name: Andrea Hull DOB: 06/02/1976 Initial Comment Caller states she is worried because she had intercourse last night and the condom broke. She is worried about pregnancy. Nurse Assessment Nurse: Debera Latalston, RN, Tinnie GensJeffrey Date/Time Lamount Cohen(Eastern Time): 07/31/2016 11:46:11 AM Confirm and document reason for call. If symptomatic, describe symptoms. ---Caller states she is worried because she had intercourse last night and the condom broke. She is worried about pregnancy. Not on birth control. Does the patient have any new or worsening symptoms? ---Yes Will a triage be completed? ---Yes Related visit to physician within the last 2 weeks? ---No Does the PT have any chronic conditions? (i.e. diabetes, asthma, etc.) ---Yes List chronic conditions. ---HTN Is the patient pregnant or possibly pregnant? (Ask all females between the ages of 5312-55) ---Yes What is the estimated delivery date? ---0001-01-01 Total number of pregnancies including current? ---2 Number of live births? ---1 Have you felt decreased fetal movement? ---Early Pregnancy - No Fetal Movement Felt Yet Is this a behavioral health or substance abuse call? ---No Guidelines Guideline Title Affirmed Question Affirmed Notes Menstrual Period - Missed or Late Wants a pregnancy test done in the office Final Disposition User See PCP When Office is Open (within 3 days) Debera Latalston, RN, [employer] CorporationJeffrey Comments Caller had questions about plan B pill. Plan B One-Step is a type of emergency contraception. This is birth control that can prevent pregnancy after unprotected sex. People sometimes call it the "morning after pill." But you don't have to wait until the morning after sex to take it. In fact, Plan B is more effective the sooner you take it. It is a one-dose regimen: you take one pill. The pill contains 1.5 milligrams of levonorgestrel,  which is used in lower doses in many birth control pills. How Does Plan B One-Step Work? Depending upon where you are in your cycle, Plan B One-Step may work in one of these ways: It may prevent or delay ovulation. It may interfere with fertilization of an egg. How Effective Is Plan B One-Step? If you take it within 72 hours after you've had unprotected sex, Plan B One-Step can reduce the risk of pregnancy by up to 89%. If you take Plan B One-Step within 24 hours, it is about 95% effective. Per [email or web address].com  PLEASE NOTE: All timestamps contained within this report are represented as [place] Standard Time. CONFIDENTIALTY NOTICE: This fax transmission is intended only for the addressee. It contains information that is legally privileged, confidential or otherwise protected from use or disclosure. If you are not the intended recipient, you are strictly prohibited from reviewing, disclosing, copying using or disseminating any of this information or taking any action in reliance on or regarding this information. If you have received this fax in error, please notify us immediately by telephone so that we can arrange for its return to us. Phone: [phone], Toll-Free: [phone], Fax: [phone] Page: 2 of 2 Call Id: [id number] Disagree/Comply: Danella Maiersomply

## 2017-02-06 ENCOUNTER — Other Ambulatory Visit: Payer: Self-pay | Admitting: Family Medicine

## 2017-02-06 NOTE — Telephone Encounter (Signed)
Last office visit 05/19/2016 with Dr. Alphonsus Sias for ST.  Last refilled 07/17/2016 for #60 with 3 refills.  Ok to refill?

## 2017-02-16 NOTE — Progress Notes (Deleted)
ANNUAL PREVENTATIVE CARE GYN  ENCOUNTER NOTE  Subjective:       Andrea Hull is a 41 y.o. G21P1011 female here for a routine annual gynecologic exam.  Current complaints: 1.  none      Past gynecological history: Menarche-12 Intervals-30-37 days Duration-5-6 days Dysmenorrhea-negative Dyspareunia-negative History of abnormal Pap smears-negative; last Pap smear 8 years ago No history of mammograms No history of STI's     Gynecologic History No LMP recorded. Contraception: coitus interruptus Last Pap: 2017 neg/neg. Results were: normal Last mammogram: never.  Obstetric History OB History  Gravida Para Term Preterm AB Living  2 1 1   1 1   SAB TAB Ectopic Multiple Live Births  1       1    # Outcome Date GA Lbr Len/2nd Weight Sex Delivery Anes PTL Lv  2 SAB 2008          1 Term 2004   9 lb 4.8 oz (4.218 kg) M CS-LTranv   LIV      Past Medical History:  Diagnosis Date  . Arthritis   . Heavy periods   . Hypertension   . Morbid obesity with BMI of 50.0-59.9, adult (HCC)   . Painful menstrual periods    h/o  . Sleep apnea, obstructive   . Tobacco abuse     Past Surgical History:  Procedure Laterality Date  . Breast Biospy  2013  . CESAREAN SECTION    . CHOLECYSTECTOMY N/A 11/17/2014   Procedure: LAPAROSCOPIC CHOLECYSTECTOMY WITH INTRAOPERATIVE CHOLANGIOGRAM;  Surgeon: Tiney Rouge III, MD;  Location: [facility] ORS;  Service: General;  Laterality: N/A;  . DILATION AND CURETTAGE OF UTERUS    . Miscarriage  2008    Current Outpatient Prescriptions on File Prior to Visit  Medication Sig Dispense Refill  . amitriptyline (ELAVIL) 25 MG tablet TAKE ONE TABLET BY MOUTH EVERY NIGHT AT BEDTIME 30 tablet 2  . azithromycin (ZITHROMAX Z-PAK) 250 MG tablet Take 2 tablets (500 mg) on  Day 1,  followed by 1 tablet (250 mg) once daily on Days 2 through 5. 6 each 0  . diclofenac (VOLTAREN) 75 MG EC tablet TAKE 1 TABLET BY MOUTH TWICE A DAY 60 tablet 3  . famotidine (PEPCID) 20 MG  tablet Take 20 mg by mouth daily as needed.      No current facility-administered medications on file prior to visit.     No Known Allergies  Social History   Social History  . Marital status: Married    Spouse name: N/A  . Number of children: N/A  . Years of education: N/A   Occupational History  . Not on file.   Social History Main Topics  . Smoking status: Current Every Day Smoker    Packs/day: 1.00    Years: 20.00    Types: Cigarettes  . Smokeless tobacco: Never Used  . Alcohol use 0.0 oz/week     Comment: rare  . Drug use: No  . Sexual activity: Yes    Birth control/ protection: Coitus interruptus   Other Topics Concern  . Not on file   Social History Narrative  . No narrative on file    Family History  Problem Relation Age of Onset  . Arthritis Mother   . Alcohol abuse Father   . Arthritis Father   . Hyperlipidemia Father   . Hypertension Father   . Diabetes Father   . Personality disorder Sister   . Anxiety disorder Sister   . Diabetes  Maternal Grandmother   . Lung cancer Maternal Grandfather   . Alcohol abuse Paternal Grandmother   . Breast cancer Paternal Grandmother   . Alcohol abuse Paternal Grandfather   . Arthritis Paternal Grandfather   . Heart disease Paternal Grandfather   . Hypertension Sister   . Hypertension Brother   . Ovarian cancer Neg Hx   . Colon cancer Neg Hx     The following portions of the patient's history were reviewed and updated as appropriate: allergies, current medications, past family history, past medical history, past social history, past surgical history and problem list.  Review of Systems ROS   Objective:   There were no vitals taken for this visit. CONSTITUTIONAL: Well-developed, well-nourished female in no acute distress.  PSYCHIATRIC: Normal mood and affect. Normal behavior. Normal judgment and thought content. NEUROLGIC: Alert and oriented to person, place, and time. Normal muscle tone coordination. No  cranial nerve deficit noted. HENT:  Normocephalic, atraumatic, External right and left ear normal. Oropharynx is clear and moist EYES: Conjunctivae and EOM are normal. Pupils are equal, round, and reactive to light. No scleral icterus.  NECK: Normal range of motion, supple, no masses.  Normal thyroid.  SKIN: Skin is warm and dry. No rash noted. Not diaphoretic. No erythema. No pallor. CARDIOVASCULAR: Normal heart rate noted, regular rhythm, no murmur. RESPIRATORY: Clear to auscultation bilaterally. Effort and breath sounds normal, no problems with respiration noted. BREASTS: Symmetric in size. No masses, skin changes, nipple drainage, or lymphadenopathy. ABDOMEN: Soft, normal bowel sounds, no distention noted.  No tenderness, rebound or guarding.  BLADDER: Normal PELVIC:  External Genitalia: Normal  BUS: Normal  Vagina: Normal  Cervix: Normal; No cervical motion tenderness; no lesions  Uterus: Mid plane; nonenlarged; difficult to palpate due to body habitus  Adnexa: Normal; nonpalpable and nontender  RV: External hemorrhoids, No Rectal Masses and Normal Sphincter tone  MUSCULOSKELETAL: Normal range of motion. No tenderness.  No cyanosis, clubbing, or edema.  2+ distal pulses. LYMPHATIC: No Axillary, Supraclavicular, or Inguinal Adenopathy.    Assessment:   Annual gynecologic examination 41 y.o. Contraception: coitus interruptus bmi-52 Problem List Items Addressed This Visit    None    Visit Diagnoses    Well woman exam with routine gynecological exam    -  Primary   Screening for breast cancer       Morbid obesity with body mass index (BMI) of 50.0 to 59.9 in adult [facility](HCC)       History of cesarean section       Tobacco user       Essential hypertension        Galactorrhea  Plan:  Pap: Due 2020 Mammogram: Ordered Stool Guaiac Testing:  Not Indicated Labs: thru pcp  Routine preventative health maintenance measures emphasized: Exercise/Diet/Weight control, Tobacco Warnings,  Alcohol/Substance use risks and Safe Sex smoking cessation strongly encouraged Slow steady weight loss strongly encouraged  Return to Clinic - 1 Year   Darol Destinerystal Raylyn Carton, [place]   Note: This dictation was prepared with Dragon dictation along with smaller phrase technology. Any transcriptional errors that result from this process are unintentional.

## 2017-02-20 ENCOUNTER — Encounter: Admitting: Obstetrics and Gynecology

## 2017-03-02 NOTE — Progress Notes (Signed)
ANNUAL PREVENTATIVE CARE GYN  ENCOUNTER NOTE  Subjective:       Andrea Hull is a 41 y.o. G46P1011 female here for a routine annual gynecologic exam.  Current complaints: 1.  Cycles heavier and cramps- no meds tired  Patient reports menstrual cycle intervals to be between 24 and 37 days. She denies intermenstrual spotting. She reports duration of flow to be 5 days. Cramps are a little worse. Patient is not interested in hormonal contraception.  Patient is still smoking 1 pack of cigarettes a day. She has not lost any weight in the past year. Patient is still struggling with her chronic hypertension and this is to be addressed at her primary care appointment next week.      Gynecologic History No LMP recorded. Contraception: coitus interruptus Last Pap: 2017 neg/neg. Results were: normal Last mammogram: never.  Past gynecological history: Menarche-12 Intervals-30-37 days Duration-5-7 days Dysmenorrhea-pos Dyspareunia-negative History of abnormal Pap smears-negative; last Pap smear 8 years ago No history of mammograms No history of STI's  Obstetric History OB History  Gravida Para Term Preterm AB Living  SAB TAB Ectopic Multiple Live Births  1       1    # Outcome Date GA Lbr Len/2nd Weight Sex Delivery Anes PTL Lv  2 SAB 2008          1 Term 2004   9 lb 4.8 oz (4.218 kg) M CS-LTranv   LIV      Past Medical History:  Diagnosis Date  . Arthritis   . Heavy periods   . Hypertension   . Morbid obesity with BMI of 50.0-59.9, adult (HCC)   . Painful menstrual periods    h/o  . Sleep apnea, obstructive   . Tobacco abuse     Past Surgical History:  Procedure Laterality Date  . Breast Biospy  2013  . CESAREAN SECTION    . CHOLECYSTECTOMY N/A 11/17/2014   Procedure: LAPAROSCOPIC CHOLECYSTECTOMY WITH INTRAOPERATIVE CHOLANGIOGRAM;  Surgeon: Tiney Rouge III, MD;  Location: [facility] ORS;  Service: General;  Laterality: N/A;  . DILATION AND CURETTAGE OF UTERUS     . Miscarriage  2008    Current Outpatient Prescriptions on File Prior to Visit  Medication Sig Dispense Refill  . amitriptyline (ELAVIL) 25 MG tablet TAKE ONE TABLET BY MOUTH EVERY NIGHT AT BEDTIME 30 tablet 2  . azithromycin (ZITHROMAX Z-PAK) 250 MG tablet Take 2 tablets (500 mg) on  Day 1,  followed by 1 tablet (250 mg) once daily on Days 2 through 5. 6 each 0  . diclofenac (VOLTAREN) 75 MG EC tablet TAKE 1 TABLET BY MOUTH TWICE A DAY 60 tablet 3  . famotidine (PEPCID) 20 MG tablet Take 20 mg by mouth daily as needed.      No current facility-administered medications on file prior to visit.     No Known Allergies  Social History   Social History  . Marital status: Married    Spouse name: N/A  . Number of children: N/A  . Years of education: N/A   Occupational History  . Not on file.   Social History Main Topics  . Smoking status: Current Every Day Smoker    Packs/day: 1.00    Years: 20.00    Types: Cigarettes  . Smokeless tobacco: Never Used  . Alcohol use 0.0 oz/week     Comment: rare  . Drug use: No  . Sexual activity: Yes  Birth control/ protection: Coitus interruptus   Other Topics Concern  . Not on file   Social History Narrative  . No narrative on file    Family History  Problem Relation Age of Onset  . Arthritis Mother   . Alcohol abuse Father   . Arthritis Father   . Hyperlipidemia Father   . Hypertension Father   . Diabetes Father   . Personality disorder Sister   . Anxiety disorder Sister   . Diabetes Maternal Grandmother   . Lung cancer Maternal Grandfather   . Alcohol abuse Paternal Grandmother   . Breast cancer Paternal Grandmother   . Alcohol abuse Paternal Grandfather   . Arthritis Paternal Grandfather   . Heart disease Paternal Grandfather   . Hypertension Sister   . Hypertension Brother   . Ovarian cancer Neg Hx   . Colon cancer Neg Hx     The following portions of the patient's history were reviewed and updated as  appropriate: allergies, current medications, past family history, past medical history, past social history, past surgical history and problem list.  Review of Systems Review of Systems  Constitutional: Negative.   HENT: Negative.   Eyes: Negative.   Respiratory: Negative.   Cardiovascular: Negative.   Gastrointestinal: Negative.   Genitourinary:       Irregular menstrual cycles as noted in history of present illness  Musculoskeletal: Negative.   Skin: Negative.   Neurological: Negative.   Endo/Heme/Allergies: Negative.        Galactorrhea persists  Psychiatric/Behavioral: Negative.      Objective:   BP (!) 175/105   Pulse 89   Ht  (1.676 m)   Wt (!) 339 lb 3.2 oz (153.9 kg)   LMP 02/20/2017 (Exact Date)   BMI 54.75 kg/m CONSTITUTIONAL: Well-developed, well-nourished female in no acute distress.  PSYCHIATRIC: Normal mood and affect. Normal behavior. Normal judgment and thought content. NEUROLGIC: Alert and oriented to person, place, and time. Normal muscle tone coordination. No cranial nerve deficit noted. HENT:  Normocephalic, atraumatic, External right and left ear normal. Oropharynx is clear and moist EYES: Conjunctivae and EOM are normal. No scleral icterus.  NECK: Normal range of motion, supple, no masses.  Normal thyroid.  SKIN: Skin is warm and dry. No rash noted. Not diaphoretic. No erythema. No pallor. CARDIOVASCULAR: Normal heart rate noted, regular rhythm, no murmur. RESPIRATORY: Clear to auscultation bilaterally. Effort and breath sounds normal, no problems with respiration noted. BREASTS: Symmetric in size. No masses, skin changes, nipple drainage, or lymphadenopathy. ABDOMEN: Soft, normal bowel sounds, no distention noted.  No tenderness, rebound or guarding.  BLADDER: Normal PELVIC:  External Genitalia: Normal  BUS: Normal  Vagina: Normal  Cervix: Normal; No cervical motion tenderness; no lesions  Uterus: Mid plane; nonenlarged; difficult to palpate  due to body habitus  Adnexa: Normal; nonpalpable and nontender  RV: External hemorrhoids, No Rectal Masses and Normal Sphincter tone  MUSCULOSKELETAL: Normal range of motion. No tenderness.  No cyanosis, clubbing, or edema.  2+ distal pulses. LYMPHATIC: No Axillary, Supraclavicular, or Inguinal Adenopathy.    Assessment:   Annual gynecologic examination 41 y.o. Contraception: coitus interruptus bmi-54 Problem List Items Addressed This Visit    None    Visit Diagnoses    Well woman exam with routine gynecological exam    -  Primary   Screening for breast cancer       Morbid obesity with body mass index (BMI) of 50.0 to 59.9 in adult [facility])  History of cesarean section       Essential hypertension       Tobacco user        Galactorrhea  Plan:  Pap: Due 2020 Mammogram: Ordered Stool Guaiac Testing:  Not Indicated Labs: thru pcp  Routine preventative health maintenance measures emphasized: Exercise/Diet/Weight control, Tobacco Warnings, Alcohol/Substance use risks and Safe Sex Smoking cessation strongly encouraged Slow steady weight loss strongly encouraged  Follow-up with primary care to help evaluate and control chronic hypertension Menstrual calendar monitoring for assessment of abnormal uterine leading  Return to Clinic - 1 Year   Crystal [place], CMA  Herold Harms, MD   Note: This dictation was prepared with Dragon dictation along with smaller phrase technology. Any transcriptional errors that result from this process are unintentional.

## 2017-03-05 ENCOUNTER — Other Ambulatory Visit: Payer: Self-pay | Admitting: Family Medicine

## 2017-03-05 DIAGNOSIS — Z0000 Encounter for general adult medical examination without abnormal findings: Secondary | ICD-10-CM

## 2017-03-06 ENCOUNTER — Encounter: Payer: Self-pay | Admitting: Obstetrics and Gynecology

## 2017-03-06 ENCOUNTER — Ambulatory Visit (INDEPENDENT_AMBULATORY_CARE_PROVIDER_SITE_OTHER): Admitting: Obstetrics and Gynecology

## 2017-03-06 VITALS — BP 175/105 | HR 89 | Ht 66.0 in | Wt 339.2 lb

## 2017-03-06 DIAGNOSIS — N926 Irregular menstruation, unspecified: Secondary | ICD-10-CM

## 2017-03-06 DIAGNOSIS — Z1231 Encounter for screening mammogram for malignant neoplasm of breast: Secondary | ICD-10-CM

## 2017-03-06 DIAGNOSIS — Z720 Tobacco use: Secondary | ICD-10-CM | POA: Diagnosis not present

## 2017-03-06 DIAGNOSIS — O926 Galactorrhea: Secondary | ICD-10-CM | POA: Diagnosis not present

## 2017-03-06 DIAGNOSIS — N946 Dysmenorrhea, unspecified: Secondary | ICD-10-CM | POA: Diagnosis not present

## 2017-03-06 DIAGNOSIS — Z98891 History of uterine scar from previous surgery: Secondary | ICD-10-CM

## 2017-03-06 DIAGNOSIS — Z01419 Encounter for gynecological examination (general) (routine) without abnormal findings: Secondary | ICD-10-CM

## 2017-03-06 DIAGNOSIS — N643 Galactorrhea not associated with childbirth: Secondary | ICD-10-CM | POA: Insufficient documentation

## 2017-03-06 DIAGNOSIS — Z6841 Body Mass Index (BMI) 40.0 and over, adult: Secondary | ICD-10-CM | POA: Diagnosis not present

## 2017-03-06 DIAGNOSIS — I10 Essential (primary) hypertension: Secondary | ICD-10-CM

## 2017-03-06 DIAGNOSIS — Z1239 Encounter for other screening for malignant neoplasm of breast: Secondary | ICD-10-CM

## 2017-03-06 NOTE — Patient Instructions (Signed)
1. No Pap smear until 2020 2. Mammogram is ordered 3. Screening labs are to be obtained through primary care 4. Patient is to have hypertension addressed through primary care 5. Smoking cessation is strongly encouraged 6. Condoms are recommended for contraception 7. Continue with healthy eating, exercise, and controlled weight loss 8. Return in 1 year for annual exam 9. Maintain menstrual calendar monitoring to assess irregular menstrual cycles. Notify us if cycle intervals are less than 21 days or greater than 42 days; notify us if bleeding is greater than 7 days; notify us if intermenstrual spotting occurs.  Health Maintenance, Female Adopting a healthy lifestyle and getting preventive care can go a long way to promote health and wellness. Talk with your health care provider about what schedule of regular examinations is right for you. This is a good chance for you to check in with your provider about disease prevention and staying healthy. In between checkups, there are plenty of things you can do on your own. Experts have done a lot of research about which lifestyle changes and preventive measures are most likely to keep you healthy. Ask your health care provider for more information. Weight and diet Eat a healthy diet  Be sure to include plenty of vegetables, fruits, low-fat dairy products, and lean protein.  Do not eat a lot of foods high in solid fats, added sugars, or salt.  Get regular exercise. This is one of the most important things you can do for your health. ? Most adults should exercise for at least 150 minutes each week. The exercise should increase your heart rate and make you sweat (moderate-intensity exercise). ? Most adults should also do strengthening exercises at least twice a week. This is in addition to the moderate-intensity exercise.  Maintain a healthy weight  Body mass index (BMI) is a measurement that can be used to identify possible weight problems. It  estimates body fat based on height and weight. Your health care provider can help determine your BMI and help you achieve or maintain a healthy weight.  For females 22 years of age and older: ? A BMI below 18.5 is considered underweight. ? A BMI of 18.5 to 24.9 is normal. ? A BMI of 25 to 29.9 is considered overweight. ? A BMI of 30 and above is considered obese.  Watch levels of cholesterol and blood lipids  You should start having your blood tested for lipids and cholesterol at 41 years of age, then have this test every 5 years.  You may need to have your cholesterol levels checked more often if: ? Your lipid or cholesterol levels are high. ? You are older than 40 years of age. ? You are at high risk for heart disease.  Cancer screening Lung Cancer  Lung cancer screening is recommended for adults 85-43 years old who are at high risk for lung cancer because of a history of smoking.  A yearly low-dose CT scan of the lungs is recommended for people who: ? Currently smoke. ? Have quit within the past 15 years. ? Have at least a 30-pack-year history of smoking. A pack year is smoking an average of one pack of cigarettes a day for 1 year.  Yearly screening should continue until it has been 15 years since you quit.  Yearly screening should stop if you develop a health problem that would prevent you from having lung cancer treatment.  Breast Cancer  Practice breast self-awareness. This means understanding how your breasts normally appear and  feel.  It also means doing regular breast self-exams. Let your health care provider know about any changes, no matter how small.  If you are in your 20s or 30s, you should have a clinical breast exam (CBE) by a health care provider every 1-3 years as part of a regular health exam.  If you are 55 or older, have a CBE every year. Also consider having a breast X-ray (mammogram) every year.  If you have a family history of breast cancer, talk to  your health care provider about genetic screening.  If you are at high risk for breast cancer, talk to your health care provider about having an MRI and a mammogram every year.  Breast cancer gene (BRCA) assessment is recommended for women who have family members with BRCA-related cancers. BRCA-related cancers include: ? Breast. ? Ovarian. ? Tubal. ? Peritoneal cancers.  Results of the assessment will determine the need for genetic counseling and BRCA1 and BRCA2 testing.  Cervical Cancer Your health care provider may recommend that you be screened regularly for cancer of the pelvic organs (ovaries, uterus, and vagina). This screening involves a pelvic examination, including checking for microscopic changes to the surface of your cervix (Pap test). You may be encouraged to have this screening done every 3 years, beginning at age 76.  For women ages 67-65, health care providers may recommend pelvic exams and Pap testing every 3 years, or they may recommend the Pap and pelvic exam, combined with testing for human papilloma virus (HPV), every 5 years. Some types of HPV increase your risk of cervical cancer. Testing for HPV may also be done on women of any age with unclear Pap test results.  Other health care providers may not recommend any screening for nonpregnant women who are considered low risk for pelvic cancer and who do not have symptoms. Ask your health care provider if a screening pelvic exam is right for you.  If you have had past treatment for cervical cancer or a condition that could lead to cancer, you need Pap tests and screening for cancer for at least 20 years after your treatment. If Pap tests have been discontinued, your risk factors (such as having a new sexual partner) need to be reassessed to determine if screening should resume. Some women have medical problems that increase the chance of getting cervical cancer. In these cases, your health care provider may recommend more  frequent screening and Pap tests.  Colorectal Cancer  This type of cancer can be detected and often prevented.  Routine colorectal cancer screening usually begins at 41 years of age and continues through 41 years of age.  Your health care provider may recommend screening at an earlier age if you have risk factors for colon cancer.  Your health care provider may also recommend using home test kits to check for hidden blood in the stool.  A small camera at the end of a tube can be used to examine your colon directly (sigmoidoscopy or colonoscopy). This is done to check for the earliest forms of colorectal cancer.  Routine screening usually begins at age 58.  Direct examination of the colon should be repeated every 5-10 years through 41 years of age. However, you may need to be screened more often if early forms of precancerous polyps or small growths are found.  Skin Cancer  Check your skin from head to toe regularly.  Tell your health care provider about any new moles or changes in moles, especially if there  is a change in a mole's shape or color.  Also tell your health care provider if you have a mole that is larger than the size of a pencil eraser.  Always use sunscreen. Apply sunscreen liberally and repeatedly throughout the day.  Protect yourself by wearing long sleeves, pants, a wide-brimmed hat, and sunglasses whenever you are outside.  Heart disease, diabetes, and high blood pressure  High blood pressure causes heart disease and increases the risk of stroke. High blood pressure is more likely to develop in: ? People who have blood pressure in the high end of the normal range (130-139/85-89 mm Hg). ? People who are overweight or obese. ? People who are African American.  If you are 50-71 years of age, have your blood pressure checked every 3-5 years. If you are 57 years of age or older, have your blood pressure checked every year. You should have your blood pressure measured  twice-once when you are at a hospital or clinic, and once when you are not at a hospital or clinic. Record the average of the two measurements. To check your blood pressure when you are not at a hospital or clinic, you can use: ? An automated blood pressure machine at a pharmacy. ? A home blood pressure monitor.  If you are between 70 years and 77 years old, ask your health care provider if you should take aspirin to prevent strokes.  Have regular diabetes screenings. This involves taking a blood sample to check your fasting blood sugar level. ? If you are at a normal weight and have a low risk for diabetes, have this test once every three years after 41 years of age. ? If you are overweight and have a high risk for diabetes, consider being tested at a younger age or more often. Preventing infection Hepatitis B  If you have a higher risk for hepatitis B, you should be screened for this virus. You are considered at high risk for hepatitis B if: ? You were born in a country where hepatitis B is common. Ask your health care provider which countries are considered high risk. ? Your parents were born in a high-risk country, and you have not been immunized against hepatitis B (hepatitis B vaccine). ? You have HIV or AIDS. ? You use needles to inject street drugs. ? You live with someone who has hepatitis B. ? You have had sex with someone who has hepatitis B. ? You get hemodialysis treatment. ? You take certain medicines for conditions, including cancer, organ transplantation, and autoimmune conditions.  Hepatitis C  Blood testing is recommended for: ? Everyone born from [birth year] through 1965. ? Anyone with known risk factors for hepatitis C.  Sexually transmitted infections (STIs)  You should be screened for sexually transmitted infections (STIs) including gonorrhea and chlamydia if: ? You are sexually active and are younger than 41 years of age. ? You are older than 41 years of age and your  health care provider tells you that you are at risk for this type of infection. ? Your sexual activity has changed since you were last screened and you are at an increased risk for chlamydia or gonorrhea. Ask your health care provider if you are at risk.  If you do not have HIV, but are at risk, it may be recommended that you take a prescription medicine daily to prevent HIV infection. This is called pre-exposure prophylaxis (PrEP). You are considered at risk if: ? You are sexually active and do  not regularly use condoms or know the HIV status of your partner(s). ? You take drugs by injection. ? You are sexually active with a partner who has HIV.  Talk with your health care provider about whether you are at high risk of being infected with HIV. If you choose to begin PrEP, you should first be tested for HIV. You should then be tested every 3 months for as long as you are taking PrEP. Pregnancy  If you are premenopausal and you may become pregnant, ask your health care provider about preconception counseling.  If you may become pregnant, take 400 to 800 micrograms (mcg) of folic acid every day.  If you want to prevent pregnancy, talk to your health care provider about birth control (contraception). Osteoporosis and menopause  Osteoporosis is a disease in which the bones lose minerals and strength with aging. This can result in serious bone fractures. Your risk for osteoporosis can be identified using a bone density scan.  If you are 43 years of age or older, or if you are at risk for osteoporosis and fractures, ask your health care provider if you should be screened.  Ask your health care provider whether you should take a calcium or vitamin D supplement to lower your risk for osteoporosis.  Menopause may have certain physical symptoms and risks.  Hormone replacement therapy may reduce some of these symptoms and risks. Talk to your health care provider about whether hormone replacement  therapy is right for you. Follow these instructions at home:  Schedule regular health, dental, and eye exams.  Stay current with your immunizations.  Do not use any tobacco products including cigarettes, chewing tobacco, or electronic cigarettes.  If you are pregnant, do not drink alcohol.  If you are breastfeeding, limit how much and how often you drink alcohol.  Limit alcohol intake to no more than 1 drink per day for nonpregnant women. One drink equals 12 ounces of beer, 5 ounces of wine, or 1 ounces of hard liquor.  Do not use street drugs.  Do not share needles.  Ask your health care provider for help if you need support or information about quitting drugs.  Tell your health care provider if you often feel depressed.  Tell your health care provider if you have ever been abused or do not feel safe at home. This information is not intended to replace advice given to you by your health care provider. Make sure you discuss any questions you have with your health care provider. Document Released: 12/19/2010 Document Revised: 11/11/2015 Document Reviewed: 03/09/2015 Elsevier Interactive Patient Education  [employer].

## 2017-03-08 ENCOUNTER — Other Ambulatory Visit (INDEPENDENT_AMBULATORY_CARE_PROVIDER_SITE_OTHER)

## 2017-03-08 DIAGNOSIS — Z0000 Encounter for general adult medical examination without abnormal findings: Secondary | ICD-10-CM | POA: Diagnosis not present

## 2017-03-08 LAB — CBC WITH DIFFERENTIAL/PLATELET
BASOS ABS: 0.1 10*3/uL (ref 0.0–0.1)
BASOS PCT: 0.6 % (ref 0.0–3.0)
EOS ABS: 0.4 10*3/uL (ref 0.0–0.7)
Eosinophils Relative: 2.8 % (ref 0.0–5.0)
HCT: 41.7 % (ref 36.0–46.0)
Hemoglobin: 14 g/dL (ref 12.0–15.0)
LYMPHS ABS: 4.4 10*3/uL — AB (ref 0.7–4.0)
Lymphocytes Relative: 29.8 % (ref 12.0–46.0)
MCHC: 33.6 g/dL (ref 30.0–36.0)
MCV: 96.1 fl (ref 78.0–100.0)
Monocytes Absolute: 1 10*3/uL (ref 0.1–1.0)
Monocytes Relative: 7.2 % (ref 3.0–12.0)
NEUTROS ABS: 8.7 10*3/uL — AB (ref 1.4–7.7)
NEUTROS PCT: 59.6 % (ref 43.0–77.0)
PLATELETS: 304 10*3/uL (ref 150.0–400.0)
RBC: 4.34 Mil/uL (ref 3.87–5.11)
RDW: 13.5 % (ref 11.5–15.5)
WBC: 14.6 10*3/uL — ABNORMAL HIGH (ref 4.0–10.5)

## 2017-03-08 LAB — HEPATIC FUNCTION PANEL
ALK PHOS: 61 U/L (ref 39–117)
ALT: 15 U/L (ref 0–35)
AST: 13 U/L (ref 0–37)
Albumin: 4.1 g/dL (ref 3.5–5.2)
BILIRUBIN DIRECT: 0.1 mg/dL (ref 0.0–0.3)
BILIRUBIN TOTAL: 0.3 mg/dL (ref 0.2–1.2)
Total Protein: 7 g/dL (ref 6.0–8.3)

## 2017-03-08 LAB — LIPID PANEL
Cholesterol: 188 mg/dL (ref 0–200)
HDL: 49 mg/dL (ref >39.00)
NONHDL: 138.97
Total CHOL/HDL Ratio: 4
Triglycerides: 235 mg/dL — ABNORMAL HIGH (ref 0.0–149.0)
VLDL: 47 mg/dL — ABNORMAL HIGH (ref 0.0–40.0)

## 2017-03-08 LAB — TSH: TSH: 1.85 u[IU]/mL (ref 0.35–4.50)

## 2017-03-08 LAB — BASIC METABOLIC PANEL
BUN: 16 mg/dL (ref 6–23)
CALCIUM: 9.4 mg/dL (ref 8.4–10.5)
CO2: 28 meq/L (ref 19–32)
CREATININE: 0.85 mg/dL (ref 0.40–1.20)
Chloride: 103 mEq/L (ref 96–112)
GFR: 78.23 mL/min (ref >60.00)
GLUCOSE: 98 mg/dL (ref 70–99)
Potassium: 4.6 mEq/L (ref 3.5–5.1)
Sodium: 137 mEq/L (ref 135–145)

## 2017-03-08 LAB — LDL CHOLESTEROL, DIRECT: LDL DIRECT: 100 mg/dL

## 2017-03-15 ENCOUNTER — Ambulatory Visit (INDEPENDENT_AMBULATORY_CARE_PROVIDER_SITE_OTHER): Admitting: Family Medicine

## 2017-03-15 ENCOUNTER — Encounter: Payer: Self-pay | Admitting: Family Medicine

## 2017-03-15 VITALS — BP 178/96 | HR 84 | Temp 98.4°F | Ht 66.25 in | Wt 344.8 lb

## 2017-03-15 DIAGNOSIS — Z23 Encounter for immunization: Secondary | ICD-10-CM

## 2017-03-15 DIAGNOSIS — I10 Essential (primary) hypertension: Secondary | ICD-10-CM

## 2017-03-15 DIAGNOSIS — Z0000 Encounter for general adult medical examination without abnormal findings: Secondary | ICD-10-CM

## 2017-03-15 MED ORDER — LISINOPRIL-HYDROCHLOROTHIAZIDE 10-12.5 MG PO TABS: 1.0000 | ORAL_TABLET | Freq: Every day | ORAL | 3 refills | Status: DC

## 2017-03-15 NOTE — Progress Notes (Signed)
Dr. Frederico Hamman T. Lavern Crimi, MD, [facility] [place], [address] Phone: [phone] Fax: [phone]  03/15/2017  Patient: Andrea Hull, MRN: [id number], DOB: Jun 07, 1976, 42 y.o.  Primary Physician:  Owens Loffler, MD   Chief Complaint  Patient presents with  . Annual Exam   Subjective:   ALONDA Hull is a 41 y.o. pleasant patient who presents with the following:  Health Maintenance Summary Reviewed and updated, unless pt declines services.  Tobacco History Reviewed. 1 PPD Alcohol: About 4 drinks a day Exercise Habits: No activity, rec at least 30 mins 5 times a week STD concerns: none Drug Use: None Lumps or breast concerns: no Breast Cancer Family History: no  Health Maintenance  Topic Date Due  . HIV Screening  11/26/1990  . TETANUS/TDAP  03/01/2026  . INFLUENZA VACCINE  Completed    Immunization History  Administered Date(s) Administered  . Influenza,inj,Quad PF,6+ Mos 04/14/2013, 03/27/2014, 04/07/2015, 03/01/2016, 03/15/2017  . Tdap 03/01/2016   Patient Active Problem List   Diagnosis Date Noted  . Irregular periods/menstrual cycles 03/06/2017  . Dysmenorrhea 03/06/2017  . Galactorrhea 03/06/2017  . History of cesarean section 03/06/2017  . Morbid obesity with body mass index (BMI) of 50.0 to 59.9 in adult ([place]) 04/15/2013  . Sleep apnea, obstructive   . Tobacco user   . Essential hypertension   . Arthritis    Past Medical History:  Diagnosis Date  . Arthritis   . Heavy periods   . Hypertension   . Morbid obesity with BMI of 50.0-59.9, adult ([place])   . Painful menstrual periods    h/o  . Sleep apnea, obstructive   . Tobacco abuse    Past Surgical History:  Procedure Laterality Date  . Breast Biospy  2013  . CESAREAN SECTION    . CHOLECYSTECTOMY N/A 11/17/2014   Procedure: LAPAROSCOPIC CHOLECYSTECTOMY WITH INTRAOPERATIVE CHOLANGIOGRAM;  Surgeon: Dia Crawford III, MD;  Location: [facility]  ORS;  Service: General;  Laterality: N/A;  . DILATION AND CURETTAGE OF UTERUS    . Miscarriage  2008   Social History   Social History  . Marital status: Married    Spouse name: N/A  . Number of children: N/A  . Years of education: N/A   Occupational History  . Not on file.   Social History Main Topics  . Smoking status: Current Every Day Smoker    Packs/day: 1.00    Years: 20.00    Types: Cigarettes  . Smokeless tobacco: Never Used  . Alcohol use 0.0 oz/week     Comment: rare  . Drug use: No  . Sexual activity: Yes    Birth control/ protection: Coitus interruptus   Other Topics Concern  . Not on file   Social History Narrative  . No narrative on file   Family History  Problem Relation Age of Onset  . Arthritis Mother   . Alcohol abuse Father   . Arthritis Father   . Hyperlipidemia Father   . Hypertension Father   . Diabetes Father   . Leukemia Father   . Personality disorder Sister   . Anxiety disorder Sister   . Diabetes Maternal Grandmother   . Lung cancer Maternal Grandfather   . Alcohol abuse Paternal Grandmother   . Breast cancer Paternal Grandmother   . Alcohol abuse Paternal Grandfather   . Arthritis Paternal Grandfather   . Heart disease Paternal Grandfather   . Hypertension Sister   .  Hypertension Brother   . Ovarian cancer Neg Hx   . Colon cancer Neg Hx    No Known Allergies  Medication list has been reviewed and updated.   General: Denies fever, chills, sweats. No significant weight loss. Eyes: Denies blurring,significant itching ENT: Denies earache, sore throat, and hoarseness.  Cardiovascular: Denies chest pains, palpitations, dyspnea on exertion,  Respiratory: Denies cough, dyspnea at rest,wheeezing Breast: no concerns about lumps GI: Denies nausea, vomiting, diarrhea, constipation, change in bowel habits, abdominal pain, melena, hematochezia GU: Denies dysuria, hematuria, urinary hesitancy, nocturia, denies STD risk, no concerns  about discharge Musculoskeletal: Denies back pain, joint pain Derm: Denies rash, itching Neuro: Denies  paresthesias, frequent falls, frequent headaches Psych: Denies depression, anxiety Endocrine: Denies cold intolerance, heat intolerance, polydipsia Heme: Denies enlarged lymph nodes Allergy: No hayfever  Objective:   BP (!) 178/96   Pulse 84   Temp 98.4 F (36.9 C) (Oral)   Ht 5' 6.25" (1.683 m)   Wt (!) 344 lb 12 oz (156.4 kg)   LMP 02/20/2017 (Exact Date)   BMI 55.22 kg/m  No exam data present  GEN: well developed, well nourished, no acute distress Eyes: conjunctiva and lids normal, PERRLA, EOMI ENT: TM clear, nares clear, oral exam WNL Neck: supple, no lymphadenopathy, no thyromegaly, no JVD Pulm: clear to auscultation and percussion, respiratory effort normal CV: regular rate and rhythm, S1-S2, no murmur, rub or gallop, no bruits Chest: no scars, masses, no lumps BREAST: breast exam declined GI: soft, non-tender; no hepatosplenomegaly, masses; active bowel sounds all quadrants GU: GU exam declined Lymph: no cervical, axillary or inguinal adenopathy MSK: gait normal, muscle tone and strength WNL, no joint swelling, effusions, discoloration, crepitus  SKIN: clear, good turgor, color WNL, no rashes, lesions, or ulcerations Neuro: normal mental status, normal strength, sensation, and motion Psych: alert; oriented to person, place and time, normally interactive and not anxious or depressed in appearance.   All labs reviewed with patient. Lipids:    Component Value Date/Time   CHOL 188 03/08/2017 1018   TRIG 235.0 (H) 03/08/2017 1018   HDL 49.00 03/08/2017 1018   LDLDIRECT 100.0 03/08/2017 1018   VLDL 47.0 (H) 03/08/2017 1018   CHOLHDL 4 03/08/2017 1018   CBC: CBC Latest Ref Rng & Units 03/08/2017 02/25/2016 11/20/2014  WBC 4.0 - 10.5 K/uL 14.6(H) 13.8(H) 13.7(H)  Hemoglobin 12.0 - 15.0 g/dL 14.0 13.9 12.9  Hematocrit 36.0 - 46.0 % 41.7 40.9 38.6  Platelets 150.0 -  400.0 K/uL 304.0 310.0 471.8    Basic Metabolic Panel:    Component Value Date/Time   NA 137 03/08/2017 1018   K 4.6 03/08/2017 1018   CL 103 03/08/2017 1018   CO2 28 03/08/2017 1018   BUN 16 03/08/2017 1018   CREATININE 0.85 03/08/2017 1018   GLUCOSE 98 03/08/2017 1018   CALCIUM 9.4 03/08/2017 1018   Hepatic Function Latest Ref Rng & Units 03/08/2017 02/25/2016 11/20/2014  Total Protein 6.0 - 8.3 g/dL 7.0 6.9 6.9  Albumin 3.5 - 5.2 g/dL 4.1 3.9 3.9  AST 0 - 37 U/L 13 36 76(H)  ALT 0 - 35 U/L 15 73(H) 160(H)  Alk Phosphatase 39 - 117 U/L 61 68 149(H)  Total Bilirubin 0.2 - 1.2 mg/dL 0.3 0.5 0.7  Bilirubin, Direct 0.0 - 0.3 mg/dL 0.1 0.1 0.2    Lab Results  Component Value Date   TSH 1.85 03/08/2017   No results found.  Assessment and Plan:   Healthcare maintenance  Need for influenza  vaccination - Plan: Flu Vaccine QUAD 36+ mos IM  Essential hypertension  HTN with Body mass index is 55.22 kg/m.   Health Maintenance Exam: The patient's preventative maintenance and recommended screening tests for an annual wellness exam were reviewed in full today. Brought up to date unless services declined.  Counselled on the importance of diet, exercise, and its role in overall health and mortality. The patient's FH and SH was reviewed, including their home life, tobacco status, and drug and alcohol status.  Follow-up in 1 year for physical exam or additional follow-up below.  Follow-up: Return in about 1 month (around 04/14/2017). Or follow-up in 1 year if not noted.  Future Appointments Date Time Provider Time  04/06/2017 2:00 PM [facility]  04/16/2017 12:00 PM Covey Baller, Frederico Hamman, MD [facility]  03/07/2018 1:30 PM Defrancesco, Alanda Slim, MD [facility] None    Meds ordered this encounter  Medications  . lisinopril-hydrochlorothiazide (PRINZIDE,ZESTORETIC) 10-12.5 MG tablet    Sig: Take 1 tablet by mouth daily.    Dispense:  30 tablet     Refill:  3   There are no discontinued medications. Orders Placed This Encounter  Procedures  . Flu Vaccine QUAD 36+ mos IM    Signed,  Casidy Alberta T. Arissa Fagin, MD   Allergies as of 03/15/2017   No Known Allergies     Medication List       Accurate as of 03/15/17  1:58 PM. Always use your most recent med list.          diclofenac 75 MG EC tablet Commonly known as:  VOLTAREN TAKE 1 TABLET BY MOUTH TWICE A DAY   famotidine 20 MG tablet Commonly known as:  PEPCID Take 20 mg by mouth daily as needed.   lisinopril-hydrochlorothiazide 10-12.5 MG tablet Commonly known as:  PRINZIDE,ZESTORETIC Take 1 tablet by mouth daily.   MELATONIN PO Take by mouth.            Discharge Care Instructions        Start     Ordered   03/15/17 0000  Flu Vaccine QUAD 36+ mos IM     03/15/17 1215   03/15/17 0000  lisinopril-hydrochlorothiazide (PRINZIDE,ZESTORETIC) 10-12.5 MG tablet  Daily     03/15/17 1258

## 2017-04-06 ENCOUNTER — Ambulatory Visit
Admission: RE | Admit: 2017-04-06 | Discharge: 2017-04-06 | Disposition: A | Discharge status: Home / Self Care | Source: Ambulatory Visit | Attending: Obstetrics and Gynecology | Admitting: Obstetrics and Gynecology

## 2017-04-06 DIAGNOSIS — Z1231 Encounter for screening mammogram for malignant neoplasm of breast: Secondary | ICD-10-CM | POA: Diagnosis not present

## 2017-04-06 DIAGNOSIS — Z1239 Encounter for other screening for malignant neoplasm of breast: Secondary | ICD-10-CM

## 2017-04-16 ENCOUNTER — Encounter: Payer: Self-pay | Admitting: Family Medicine

## 2017-04-16 ENCOUNTER — Ambulatory Visit (INDEPENDENT_AMBULATORY_CARE_PROVIDER_SITE_OTHER): Admitting: Family Medicine

## 2017-04-16 VITALS — BP 140/70 | HR 97 | Temp 98.9°F | Ht 66.25 in | Wt 342.5 lb

## 2017-04-16 DIAGNOSIS — Z6841 Body Mass Index (BMI) 40.0 and over, adult: Secondary | ICD-10-CM | POA: Diagnosis not present

## 2017-04-16 DIAGNOSIS — I10 Essential (primary) hypertension: Secondary | ICD-10-CM | POA: Diagnosis not present

## 2017-04-16 MED ORDER — LISINOPRIL-HYDROCHLOROTHIAZIDE 10-12.5 MG PO TABS: 1.0000 | ORAL_TABLET | Freq: Every day | ORAL | 3 refills | Status: DC

## 2017-04-16 NOTE — Progress Notes (Signed)
Dr. Karleen Hampshire T. Jaisha Villacres, MD, CAQ [facility] [address] [place], [address] Phone: [phone] Fax: [phone]  04/16/2017  Patient: Andrea Hull, MRN: [id number], DOB: 1976-02-26, 41 y.o.  Primary Physician:  Hannah Beat, MD   Chief Complaint  Patient presents with  . Follow-up    HTN   Subjective:   Arleen Bar Wojtaszek is a 41 y.o. very pleasant female patient who presents with the following:  BP Readings from Last 3 Encounters:  04/16/17 140/70  03/15/17 (!) 178/96  03/06/17 (!) 175/105    120-140 / 70- 80.  HTN: Tolerating all medications without side effects Stable and at goal No CP, no sob. No HA.  BP Readings from Last 3 Encounters:  04/16/17 140/70  03/15/17 (!) 178/96  03/06/17 (!) 175/105    Basic Metabolic Panel:    Component Value Date/Time   NA 137 03/08/2017 1018   K 4.6 03/08/2017 1018   CL 103 03/08/2017 1018   CO2 28 03/08/2017 1018   BUN 16 03/08/2017 1018   CREATININE 0.85 03/08/2017 1018   GLUCOSE 98 03/08/2017 1018   CALCIUM 9.4 03/08/2017 1018     Body mass index is 54.86 kg/m.   Past Medical History, Surgical History, Social History, Family History, Problem List, Medications, and Allergies have been reviewed and updated if relevant.  Patient Active Problem List   Diagnosis Date Noted  . Irregular periods/menstrual cycles 03/06/2017  . Dysmenorrhea 03/06/2017  . Galactorrhea 03/06/2017  . History of cesarean section 03/06/2017  . Morbid obesity with body mass index (BMI) of 50.0 to 59.9 in adult (HCC) 04/15/2013  . Sleep apnea, obstructive   . Tobacco user   . Essential hypertension   . Arthritis     Past Medical History:  Diagnosis Date  . Arthritis   . Heavy periods   . Hypertension   . Morbid obesity with BMI of 50.0-59.9, adult (HCC)   . Painful menstrual periods    h/o  . Sleep apnea, obstructive   . Tobacco abuse     Past Surgical History:  Procedure  Laterality Date  . Breast Biospy  2013  . CESAREAN SECTION    . CHOLECYSTECTOMY N/A 11/17/2014   Procedure: LAPAROSCOPIC CHOLECYSTECTOMY WITH INTRAOPERATIVE CHOLANGIOGRAM;  Surgeon: Tiney Rouge III, MD;  Location: [facility] ORS;  Service: General;  Laterality: N/A;  . DILATION AND CURETTAGE OF UTERUS    . Miscarriage  2008    Social History   Social History  . Marital status: Married    Spouse name: N/A  . Number of children: N/A  . Years of education: N/A   Occupational History  . Not on file.   Social History Main Topics  . Smoking status: Current Every Day Smoker    Packs/day: 1.00    Years: 20.00    Types: Cigarettes  . Smokeless tobacco: Never Used  . Alcohol use 0.0 oz/week     Comment: rare  . Drug use: No  . Sexual activity: Yes    Birth control/ protection: Coitus interruptus   Other Topics Concern  . Not on file   Social History Narrative  . No narrative on file    Family History  Problem Relation Age of Onset  . Arthritis Mother   . Alcohol abuse Father   . Arthritis Father   . Hyperlipidemia Father   . Hypertension Father   . Diabetes Father   . Leukemia Father   . Personality  disorder Sister   . Anxiety disorder Sister   . Diabetes Maternal Grandmother   . Lung cancer Maternal Grandfather   . Alcohol abuse Paternal Grandmother   . Breast cancer Paternal Grandmother   . Alcohol abuse Paternal Grandfather   . Arthritis Paternal Grandfather   . Heart disease Paternal Grandfather   . Hypertension Sister   . Hypertension Brother   . Ovarian cancer Neg Hx   . Colon cancer Neg Hx     No Known Allergies  Medication list reviewed and updated in full in [facility].   GEN: No acute illnesses, no fevers, chills. GI: No n/v/d, eating normally Pulm: No SOB Interactive and getting along well at home.  Otherwise, ROS is as per the HPI.  Objective:   BP 140/70   Pulse 97   Temp 98.9 F (37.2 C) (Oral)   Ht 5' 6.25" (1.683 m)   Wt (!) 342 lb  8 oz (155.4 kg)   LMP 04/13/2017   BMI 54.86 kg/m   GEN: WDWN, NAD, Non-toxic, A & O x 3 HEENT: Atraumatic, Normocephalic. Neck supple. No masses, No LAD. Ears and Nose: No external deformity. CV: RRR, No M/G/R. No JVD. No thrill. No extra heart sounds. PULM: CTA B, no wheezes, crackles, rhonchi. No retractions. No resp. distress. No accessory muscle use. EXTR: No c/c/e NEURO Normal gait.  PSYCH: Normally interactive. Conversant. Not depressed or anxious appearing.  Calm demeanor.   Laboratory and Imaging Data:  Assessment and Plan:   Essential hypertension  Morbid obesity with body mass index (BMI) of 50.0 to 59.9 in adult [facility] [place] [facility](HCC)  Working on decreased ETOH, weight loss, BP much improved  Follow-up: 1 year  Future Appointments Date Time Provider Department Center  03/07/2018 1:30 PM Defrancesco, Prentice DockerMartin A, MD [facility] None    Meds ordered this encounter  Medications  . lisinopril-hydrochlorothiazide (PRINZIDE,ZESTORETIC) 10-12.5 MG tablet    Sig: Take 1 tablet by mouth daily.    Dispense:  90 tablet    Refill:  3   Medications Discontinued During This Encounter  Medication Reason  . lisinopril-hydrochlorothiazide (PRINZIDE,ZESTORETIC) 10-12.5 MG tablet Reorder   No orders of the defined types were placed in this encounter.   Signed,  Elpidio GaleaSpencer T. Kierstynn Babich, MD   Allergies as of 04/16/2017   No Known Allergies     Medication List       Accurate as of 04/16/17 12:35 PM. Always use your most recent med list.          diclofenac 75 MG EC tablet Commonly known as:  VOLTAREN TAKE 1 TABLET BY MOUTH TWICE A DAY   famotidine 20 MG tablet Commonly known as:  PEPCID Take 20 mg by mouth daily as needed.   lisinopril-hydrochlorothiazide 10-12.5 MG tablet Commonly known as:  PRINZIDE,ZESTORETIC Take 1 tablet by mouth daily.   MELATONIN PO Take by mouth.

## 2018-01-07 ENCOUNTER — Other Ambulatory Visit: Payer: Self-pay | Admitting: Family Medicine

## 2018-01-07 NOTE — Telephone Encounter (Signed)
Copied from CRM [id number]. Topic: Quick Communication - Rx Refill/Question >> Jan 07, 2018 10:18 AM Maia Pettiesrtiz, Kristie S wrote: Medication: diclofenac (VOLTAREN) 75 MG EC tablet - pt out - sometimes pt takes 1/day sometimes 2/day - she is having knee pain - pt out of town with her father who is in the hospital Has the patient contacted their pharmacy? Yes - no refills Preferred Pharmacy (with phone number or street name): [facility] [id number] [place] - [address] [facility] [address] [facility] [address] [phone] (Phone) [phone] (Fax)

## 2018-01-08 NOTE — Telephone Encounter (Signed)
Voltaren 75 mg EC tablet  Refill request  LOV 04/16/17 with Karleen HampshireSpencer Copland  Last refill:  04/16/17  She is out of town with her father who is in the hospital  [facility] [id number] - [place], [place] - [phone]  [phone] Phone.    [phone] Fax

## 2018-01-09 MED ORDER — DICLOFENAC SODIUM 75 MG PO TBEC: 75.0000 mg | DELAYED_RELEASE_TABLET | Freq: Two times a day (BID) | ORAL | 3 refills | Status: DC

## 2018-02-26 ENCOUNTER — Ambulatory Visit (INDEPENDENT_AMBULATORY_CARE_PROVIDER_SITE_OTHER): Admitting: Family Medicine

## 2018-02-26 ENCOUNTER — Encounter: Payer: Self-pay | Admitting: Family Medicine

## 2018-02-26 VITALS — BP 140/78 | HR 94 | Temp 98.5°F | Ht 66.25 in | Wt 346.0 lb

## 2018-02-26 DIAGNOSIS — J209 Acute bronchitis, unspecified: Secondary | ICD-10-CM | POA: Diagnosis not present

## 2018-02-26 DIAGNOSIS — Z720 Tobacco use: Secondary | ICD-10-CM | POA: Diagnosis not present

## 2018-02-26 MED ORDER — GUAIFENESIN-CODEINE 100-10 MG/5ML PO SYRP: 5.0000 mL | ORAL_SOLUTION | Freq: Two times a day (BID) | ORAL | 0 refills | Status: DC | PRN

## 2018-02-26 NOTE — Progress Notes (Signed)
BP 140/78 (BP Location: Left Arm, Patient Position: Sitting, Cuff Size: Large)   Pulse 94   Temp 98.5 F (36.9 C) (Oral)   Ht 5' 6.25" (1.683 m)   Wt (!) 346 lb (156.9 kg)   LMP 02/04/2018   SpO2 96%   BMI 55.43 kg/m    CC: cough Subjective:    Patient ID: Andrea Hull, female    DOB: 07/16/1975, 42 y.o.   MRN: [id number]  HPI: Andrea Hull is a 42 y.o. female presenting on 02/26/2018 for Cough (C/o productive cough with green mucous. Feels cough deep in chest and hurts with cough. Started 3 days ago. Also, has sore throat. Says it feels like bronchitis. Tried honey and lemon and cough drops. )   "Feels like bronchitis" 3d h/o cough, nasal congestion, ST, chest congestion. Cough worse in am and at night. Some dyspnea in the morning.   Treating with lemon, hot tea, nyquil, menthol.  No fevers/chills, ear or tooth pain, headaches, PNdrainage.   Son sick at home. Current smoker - 1 ppd, less with feeling.  No h/o asthma.  H/o HTN - on lisinopril/hctz.   Relevant past medical, surgical, family and social history reviewed and updated as indicated. Interim medical history since our last visit reviewed. Allergies and medications reviewed and updated. Outpatient Medications Prior to Visit  Medication Sig Dispense Refill  . diclofenac (VOLTAREN) 75 MG EC tablet Take 1 tablet (75 mg total) by mouth 2 (two) times daily. 60 tablet 3  . famotidine (PEPCID) 20 MG tablet Take 20 mg by mouth daily as needed.     Marland Kitchen lisinopril-hydrochlorothiazide (PRINZIDE,ZESTORETIC) 10-12.5 MG tablet Take 1 tablet by mouth daily. 90 tablet 3  . MELATONIN PO Take by mouth.     No facility-administered medications prior to visit.      Per HPI unless specifically indicated in ROS section below Review of Systems     Objective:    BP 140/78 (BP Location: Left Arm, Patient Position: Sitting, Cuff Size: Large)   Pulse 94   Temp 98.5 F (36.9 C) (Oral)   Ht 5' 6.25" (1.683 m)   Wt (!) 346 lb  (156.9 kg)   LMP 02/04/2018   SpO2 96%   BMI 55.43 kg/m   Wt Readings from Last 3 Encounters:  02/26/18 (!) 346 lb (156.9 kg)  04/16/17 (!) 342 lb 8 oz (155.4 kg)  03/15/17 (!) 344 lb 12 oz (156.4 kg)    Physical Exam  Constitutional: She appears well-developed and well-nourished. No distress.  HENT:  Head: Normocephalic and atraumatic.  Right Ear: Hearing, tympanic membrane, external ear and ear canal normal.  Left Ear: Hearing, tympanic membrane, external ear and ear canal normal.  Nose: Mucosal edema (nasal mucosal congestion) and rhinorrhea present. Right sinus exhibits no maxillary sinus tenderness and no frontal sinus tenderness. Left sinus exhibits no maxillary sinus tenderness and no frontal sinus tenderness.  Mouth/Throat: Uvula is midline, oropharynx is clear and moist and mucous membranes are normal. No oropharyngeal exudate, posterior oropharyngeal edema, posterior oropharyngeal erythema or tonsillar abscesses.  Eyes: Pupils are equal, round, and reactive to light. Conjunctivae and EOM are normal. No scleral icterus.  Neck: Normal range of motion. Neck supple.  Cardiovascular: Normal rate, regular rhythm, normal heart sounds and intact distal pulses.  No murmur heard. Pulmonary/Chest: Effort normal and breath sounds normal. No respiratory distress. She has no wheezes. She has no rales.  Lungs clear Nagging cough present  Lymphadenopathy:    She  has no cervical adenopathy.  Skin: Skin is warm and dry. No rash noted.  Nursing note and vitals reviewed.  Results for orders placed or performed in visit on 03/08/17  Lipid panel  Result Value Ref Range   Cholesterol 188 0 - 200 mg/dL   Triglycerides 161.0 (H) 0.0 - 149.0 mg/dL   HDL 96.04 >54.09 mg/dL   VLDL 81.1 (H) 0.0 - 91.4 mg/dL   Total CHOL/HDL Ratio 4    NonHDL 138.97   Hepatic function panel  Result Value Ref Range   Total Bilirubin 0.3 0.2 - 1.2 mg/dL   Bilirubin, Direct 0.1 0.0 - 0.3 mg/dL   Alkaline  Phosphatase 61 39 - 117 U/L   AST 13 0 - 37 U/L   ALT 15 0 - 35 U/L   Total Protein 7.0 6.0 - 8.3 g/dL   Albumin 4.1 3.5 - 5.2 g/dL  Basic metabolic panel  Result Value Ref Range   Sodium 137 135 - 145 mEq/L   Potassium 4.6 3.5 - 5.1 mEq/L   Chloride 103 96 - 112 mEq/L   CO2 28 19 - 32 mEq/L   Glucose, Bld 98 70 - 99 mg/dL   BUN 16 6 - 23 mg/dL   Creatinine, Ser 7.82 0.40 - 1.20 mg/dL   Calcium 9.4 8.4 - 95.6 mg/dL   GFR 21.30 >86.57 mL/min  CBC with Differential/Platelet  Result Value Ref Range   WBC 14.6 (H) 4.0 - 10.5 K/uL   RBC 4.34 3.87 - 5.11 Mil/uL   Hemoglobin 14.0 12.0 - 15.0 g/dL   HCT 84.6 96.2 - 95.2 %   MCV 96.1 78.0 - 100.0 fl   MCHC 33.6 30.0 - 36.0 g/dL   RDW 84.1 32.4 - 40.1 %   Platelets 304.0 150.0 - 400.0 K/uL   Neutrophils Relative % 59.6 43.0 - 77.0 %   Lymphocytes Relative 29.8 12.0 - 46.0 %   Monocytes Relative 7.2 3.0 - 12.0 %   Eosinophils Relative 2.8 0.0 - 5.0 %   Basophils Relative 0.6 0.0 - 3.0 %   Neutro Abs 8.7 (H) 1.4 - 7.7 K/uL   Lymphs Abs 4.4 (H) 0.7 - 4.0 K/uL   Monocytes Absolute 1.0 0.1 - 1.0 K/uL   Eosinophils Absolute 0.4 0.0 - 0.7 K/uL   Basophils Absolute 0.1 0.0 - 0.1 K/uL  TSH  Result Value Ref Range   TSH 1.85 0.35 - 4.50 uIU/mL  LDL cholesterol, direct  Result Value Ref Range   Direct LDL 100.0 mg/dL      Assessment & Plan:   Problem List Items Addressed This Visit    Tobacco user    Encouraged ongoing efforts at cessation.      Acute bronchitis - Primary    Anticipate viral given short duration. Supportive care including fluids, rest, NSAID, cheratussin cough syrup.  Red flags to seek further care reviewed.           Meds ordered this encounter  Medications  . guaiFENesin-codeine (CHERATUSSIN AC) 100-10 MG/5ML syrup    Sig: Take 5 mLs by mouth 2 (two) times daily as needed.    Dispense:  120 mL    Refill:  0   No orders of the defined types were placed in this encounter.   Follow up plan: No  follow-ups on file.  Eustaquio Boyden, MD

## 2018-02-26 NOTE — Assessment & Plan Note (Signed)
Anticipate viral given short duration. Supportive care including fluids, rest, NSAID, cheratussin cough syrup.  Red flags to seek further care reviewed.

## 2018-02-26 NOTE — Patient Instructions (Addendum)
I think you do have bronchitis, likely viral.  Symptoms should resolve over 7-10 days, cough may linger a few weeks.  Push fluids, rest, take diclofenac 75mg  twice daily with meals for the next 5 days. May use codeine cough syrup for night time to help suppress cough.  May continue nyquil.  Watch for fever >101, worsening productive cough, or not improving as expected.

## 2018-02-26 NOTE — Assessment & Plan Note (Signed)
Encouraged ongoing efforts at cessation.  

## 2018-03-01 NOTE — Progress Notes (Deleted)
ANNUAL PREVENTATIVE CARE GYN  ENCOUNTER NOTE  Subjective:       Andrea Hull is a 42 y.o. G67P1011 female here for a routine annual gynecologic exam.  Current complaints: 1.    Patient is still smoking 1 pack of cigarettes a day.        Gynecologic History No LMP recorded. Contraception: coitus interruptus Last Pap: 2017 neg/neg. Results were: normal Last mammogram: 03/27/2017 birad 1.  Past gynecological history: Menarche-12 Intervals-30-37 days Duration-5-7 days Dysmenorrhea-pos Dyspareunia-negative History of abnormal Pap smears-negative; last Pap smear 8 years ago No history of mammograms No history of STI's  Obstetric History OB History  Gravida Para Term Preterm AB Living  2 1 1   1 1   SAB TAB Ectopic Multiple Live Births  1       1    # Outcome Date GA Lbr Len/2nd Weight Sex Delivery Anes PTL Lv  2 SAB 2008          1 Term 2004   9 lb 4.8 oz (4.218 kg) M CS-LTranv   LIV    Past Medical History:  Diagnosis Date  . Arthritis   . Heavy periods   . Hypertension   . Morbid obesity with BMI of 50.0-59.9, adult (HCC)   . Painful menstrual periods    h/o  . Sleep apnea, obstructive   . Tobacco abuse     Past Surgical History:  Procedure Laterality Date  . Breast Biospy  2013  . CESAREAN SECTION    . CHOLECYSTECTOMY N/A 11/17/2014   Procedure: LAPAROSCOPIC CHOLECYSTECTOMY WITH INTRAOPERATIVE CHOLANGIOGRAM;  Surgeon: Tiney Rouge III, MD;  Location: [facility] ORS;  Service: General;  Laterality: N/A;  . DILATION AND CURETTAGE OF UTERUS    . Miscarriage  2008    Current Outpatient Medications on File Prior to Visit  Medication Sig Dispense Refill  . diclofenac (VOLTAREN) 75 MG EC tablet Take 1 tablet (75 mg total) by mouth 2 (two) times daily. 60 tablet 3  . famotidine (PEPCID) 20 MG tablet Take 20 mg by mouth daily as needed.     Marland Kitchen guaiFENesin-codeine (CHERATUSSIN AC) 100-10 MG/5ML syrup Take 5 mLs by mouth 2 (two) times daily as needed. 120 mL 0  .  lisinopril-hydrochlorothiazide (PRINZIDE,ZESTORETIC) 10-12.5 MG tablet Take 1 tablet by mouth daily. 90 tablet 3  . MELATONIN PO Take by mouth.     No current facility-administered medications on file prior to visit.     No Known Allergies  Social History   Socioeconomic History  . Marital status: Married    Spouse name: Not on file  . Number of children: Not on file  . Years of education: Not on file  . Highest education level: Not on file  Occupational History  . Not on file  Social Needs  . Financial resource strain: Not on file  . Food insecurity:    Worry: Not on file    Inability: Not on file  . Transportation needs:    Medical: Not on file    Non-medical: Not on file  Tobacco Use  . Smoking status: Current Every Day Smoker    Packs/day: 1.00    Years: 20.00    Pack years: 20.00    Types: Cigarettes  . Smokeless tobacco: Never Used  Substance and Sexual Activity  . Alcohol use: Yes    Alcohol/week: 0.0 standard drinks    Comment: rare  . Drug use: No  . Sexual activity: Yes    Birth control/protection:  Coitus interruptus  Lifestyle  . Physical activity:    Days per week: Not on file    Minutes per session: Not on file  . Stress: Not on file  Relationships  . Social connections:    Talks on phone: Not on file    Gets together: Not on file    Attends religious service: Not on file    Active member of club or organization: Not on file    Attends meetings of clubs or organizations: Not on file    Relationship status: Not on file  . Intimate partner violence:    Fear of current or ex partner: Not on file    Emotionally abused: Not on file    Physically abused: Not on file    Forced sexual activity: Not on file  Other Topics Concern  . Not on file  Social History Narrative  . Not on file    Family History  Problem Relation Age of Onset  . Arthritis Mother   . Alcohol abuse Father   . Arthritis Father   . Hyperlipidemia Father   . Hypertension  Father   . Diabetes Father   . Leukemia Father   . Personality disorder Sister   . Anxiety disorder Sister   . Diabetes Maternal Grandmother   . Lung cancer Maternal Grandfather   . Alcohol abuse Paternal Grandmother   . Breast cancer Paternal Grandmother   . Alcohol abuse Paternal Grandfather   . Arthritis Paternal Grandfather   . Heart disease Paternal Grandfather   . Hypertension Sister   . Hypertension Brother   . Ovarian cancer Neg Hx   . Colon cancer Neg Hx     The following portions of the patient's history were reviewed and updated as appropriate: allergies, current medications, past family history, past medical history, past social history, past surgical history and problem list.  Review of Systems    Objective:   There were no vitals taken for this visit.CONSTITUTIONAL: Well-developed, well-nourished female in no acute distress.  PSYCHIATRIC: Normal mood and affect. Normal behavior. Normal judgment and thought content. NEUROLGIC: Alert and oriented to person, place, and time. Normal muscle tone coordination. No cranial nerve deficit noted. HENT:  Normocephalic, atraumatic, External right and left ear normal. Oropharynx is clear and moist EYES: Conjunctivae and EOM are normal. No scleral icterus.  NECK: Normal range of motion, supple, no masses.  Normal thyroid.  SKIN: Skin is warm and dry. No rash noted. Not diaphoretic. No erythema. No pallor. CARDIOVASCULAR: Normal heart rate noted, regular rhythm, no murmur. RESPIRATORY: Clear to auscultation bilaterally. Effort and breath sounds normal, no problems with respiration noted. BREASTS: Symmetric in size. No masses, skin changes, nipple drainage, or lymphadenopathy. ABDOMEN: Soft, normal bowel sounds, no distention noted.  No tenderness, rebound or guarding.  BLADDER: Normal PELVIC:  External Genitalia: Normal  BUS: Normal  Vagina: Normal  Cervix: Normal; No cervical motion tenderness; no lesions  Uterus: Mid plane;  nonenlarged; difficult to palpate due to body habitus  Adnexa: Normal; nonpalpable and nontender  RV: External hemorrhoids, No Rectal Masses and Normal Sphincter tone  MUSCULOSKELETAL: Normal range of motion. No tenderness.  No cyanosis, clubbing, or edema.  2+ distal pulses. LYMPHATIC: No Axillary, Supraclavicular, or Inguinal Adenopathy.    Assessment:   Annual gynecologic examination 42 y.o. Contraception: coitus interruptus [id number] Problem List Items Addressed This Visit    Morbid obesity with body mass index (BMI) of 50.0 to 59.9 in adult [facility](HCC)   Tobacco user  Essential hypertension   History of cesarean section    Other Visit Diagnoses    Well woman exam with routine gynecological exam    -  Primary   Screening for breast cancer        Galactorrhea  Plan:  Pap: Due 2020 Mammogram: Ordered Stool Guaiac Testing:  Not Indicated Labs: thru pcp  Routine preventative health maintenance measures emphasized: Exercise/Diet/Weight control, Tobacco Warnings, Alcohol/Substance use risks and Safe Sex Smoking cessation strongly encouraged Slow steady weight loss strongly encouraged  Return to Clinic - 1 Year   Darol Destine, CMA    Note: This dictation was prepared with Dragon dictation along with smaller phrase technology. Any transcriptional errors that result from this process are unintentional.

## 2018-03-04 ENCOUNTER — Telehealth: Payer: Self-pay | Admitting: *Deleted

## 2018-03-04 MED ORDER — AZITHROMYCIN 250 MG PO TABS: ORAL_TABLET | ORAL | 0 refills | Status: DC

## 2018-03-04 NOTE — Telephone Encounter (Signed)
Copied from CRM #[id number]. Topic: General [phone]- Other >> Mar 04, 2018  9:01 AM Gaynelle AduPoole, Shalonda wrote: Reason for CRM: patient is calling to advise that she is having sinus congestive and she stop taking the medication that was prescribe on 02-26-18. She stated she is  not feeling any better  The patient came in on 02-26-18 to see Dr. Sharen HonesGutierrez.

## 2018-03-04 NOTE — Telephone Encounter (Signed)
Spoke with pt relaying Dr. G's message. Pt verbalizes understanding.  

## 2018-03-04 NOTE — Telephone Encounter (Signed)
plz notify I've sent in zpack antibiotic to take for her bronchitis. This medication is for 5 days but is long acting and should stay working effect in her system for 2 weeks

## 2018-03-06 ENCOUNTER — Other Ambulatory Visit: Payer: Self-pay | Admitting: Family Medicine

## 2018-03-06 DIAGNOSIS — R5383 Other fatigue: Secondary | ICD-10-CM

## 2018-03-06 DIAGNOSIS — Z13220 Encounter for screening for lipoid disorders: Secondary | ICD-10-CM

## 2018-03-07 ENCOUNTER — Encounter: Admitting: Obstetrics and Gynecology

## 2018-03-13 ENCOUNTER — Other Ambulatory Visit (INDEPENDENT_AMBULATORY_CARE_PROVIDER_SITE_OTHER)

## 2018-03-13 DIAGNOSIS — Z13220 Encounter for screening for lipoid disorders: Secondary | ICD-10-CM

## 2018-03-13 DIAGNOSIS — R5383 Other fatigue: Secondary | ICD-10-CM

## 2018-03-13 LAB — BASIC METABOLIC PANEL
BUN: 17 mg/dL (ref 6–23)
CHLORIDE: 99 meq/L (ref 96–112)
CO2: 28 meq/L (ref 19–32)
CREATININE: 0.9 mg/dL (ref 0.40–1.20)
Calcium: 9.6 mg/dL (ref 8.4–10.5)
GFR: 72.88 mL/min (ref >60.00)
Glucose, Bld: 104 mg/dL — ABNORMAL HIGH (ref 70–99)
Potassium: 4.2 mEq/L (ref 3.5–5.1)
Sodium: 135 mEq/L (ref 135–145)

## 2018-03-13 LAB — CBC WITH DIFFERENTIAL/PLATELET
BASOS ABS: 0.1 10*3/uL (ref 0.0–0.1)
Basophils Relative: 0.7 % (ref 0.0–3.0)
Eosinophils Absolute: 0.5 10*3/uL (ref 0.0–0.7)
Eosinophils Relative: 3.2 % (ref 0.0–5.0)
HEMATOCRIT: 41.4 % (ref 36.0–46.0)
Hemoglobin: 14.2 g/dL (ref 12.0–15.0)
LYMPHS PCT: 28.7 % (ref 12.0–46.0)
Lymphs Abs: 4.4 10*3/uL — ABNORMAL HIGH (ref 0.7–4.0)
MCHC: 34.2 g/dL (ref 30.0–36.0)
MCV: 94 fl (ref 78.0–100.0)
MONOS PCT: 5.8 % (ref 3.0–12.0)
Monocytes Absolute: 0.9 10*3/uL (ref 0.1–1.0)
Neutro Abs: 9.5 10*3/uL — ABNORMAL HIGH (ref 1.4–7.7)
Neutrophils Relative %: 61.6 % (ref 43.0–77.0)
Platelets: 333 10*3/uL (ref 150.0–400.0)
RBC: 4.4 Mil/uL (ref 3.87–5.11)
RDW: 13.7 % (ref 11.5–15.5)
WBC: 15.5 10*3/uL — ABNORMAL HIGH (ref 4.0–10.5)

## 2018-03-13 LAB — LIPID PANEL
Cholesterol: 175 mg/dL (ref 0–200)
HDL: 33 mg/dL — AB (ref >39.00)
NonHDL: 141.88
TRIGLYCERIDES: 390 mg/dL — AB (ref 0.0–149.0)
Total CHOL/HDL Ratio: 5
VLDL: 78 mg/dL — ABNORMAL HIGH (ref 0.0–40.0)

## 2018-03-13 LAB — HEPATIC FUNCTION PANEL
ALT: 16 U/L (ref 0–35)
AST: 13 U/L (ref 0–37)
Albumin: 4.2 g/dL (ref 3.5–5.2)
Alkaline Phosphatase: 60 U/L (ref 39–117)
Bilirubin, Direct: 0.1 mg/dL (ref 0.0–0.3)
TOTAL PROTEIN: 7.4 g/dL (ref 6.0–8.3)
Total Bilirubin: 0.6 mg/dL (ref 0.2–1.2)

## 2018-03-13 LAB — TSH: TSH: 1.86 u[IU]/mL (ref 0.35–4.50)

## 2018-03-13 LAB — LDL CHOLESTEROL, DIRECT: LDL DIRECT: 79 mg/dL

## 2018-03-14 NOTE — Progress Notes (Signed)
ANNUAL PREVENTATIVE CARE GYN  ENCOUNTER NOTE  Subjective:       Andrea Hull is a 42 y.o. G53P1011 female here for a routine annual gynecologic exam.  Current complaints: 1. none   Patient is still smoking 1 pack of cigarettes a day; she is to start NicoDerm patch prescribed by primary care this week. Patient is drinking approximately 3-4 scotches 3-4 times a week; father has issue with alcoholism; Andrea Hull reports slight increased drinking recently as stress related.  Moderation is strongly encouraged. Menses are irregular every 1 to 2 months with normal duration less than 7 days.  No intermenstrual bleeding. Bowel function is normal. Bladder function is normal.  Gynecologic History LMP- 03/04/2018 Contraception: coitus interruptus Last Pap: 2017 neg/neg. Results were: normal Last mammogram: 03/27/2017 birad 1.  Past gynecological history: Menarche-12 Intervals-30-37 days Duration-5-7 days Dysmenorrhea-pos Dyspareunia-negative History of abnormal Pap smears-negative; last Pap smear 8 years ago No history of mammograms No history of STI's  Obstetric History OB History  Gravida Para Term Preterm AB Living  2 1 1   1 1   SAB TAB Ectopic Multiple Live Births  1       1    # Outcome Date GA Lbr Len/2nd Weight Sex Delivery Anes PTL Lv  2 SAB 2008          1 Term 2004   9 lb 4.8 oz (4.218 kg) M CS-LTranv   LIV    Past Medical History:  Diagnosis Date  . Arthritis   . Heavy periods   . Hypertension   . Morbid obesity with BMI of 50.0-59.9, adult ([facility])   . Painful menstrual periods    h/o  . Sleep apnea, obstructive   . Tobacco abuse     Past Surgical History:  Procedure Laterality Date  . Breast Biospy  2013  . CESAREAN SECTION    . CHOLECYSTECTOMY N/A 11/17/2014   Procedure: LAPAROSCOPIC CHOLECYSTECTOMY WITH INTRAOPERATIVE CHOLANGIOGRAM;  Surgeon: Tiney Rouge III, Andrea Hull;  Location: [facility] ORS;  Service: General;  Laterality: N/A;  . DILATION AND CURETTAGE OF UTERUS    .  Miscarriage  2008    Current Outpatient Medications on File Prior to Visit  Medication Sig Dispense Refill  . azithromycin (ZITHROMAX) 250 MG tablet Take two tablets on day one followed by one tablet on days 2-5 6 each 0  . diclofenac (VOLTAREN) 75 MG EC tablet Take 1 tablet (75 mg total) by mouth 2 (two) times daily. 60 tablet 3  . famotidine (PEPCID) 20 MG tablet Take 20 mg by mouth daily as needed.     Marland Kitchen guaiFENesin-codeine (CHERATUSSIN AC) 100-10 MG/5ML syrup Take 5 mLs by mouth 2 (two) times daily as needed. 120 mL 0  . lisinopril-hydrochlorothiazide (PRINZIDE,ZESTORETIC) 10-12.5 MG tablet Take 1 tablet by mouth daily. 90 tablet 3  . MELATONIN PO Take by mouth.     No current facility-administered medications on file prior to visit.     No Known Allergies  Social History   Socioeconomic History  . Marital status: Married    Spouse name: Not on file  . Number of children: Not on file  . Years of education: Not on file  . Highest education level: Not on file  Occupational History  . Not on file  Social Needs  . Financial resource strain: Not on file  . Food insecurity:    Worry: Not on file    Inability: Not on file  . Transportation needs:    Medical: Not  on file    Non-medical: Not on file  Tobacco Use  . Smoking status: Current Every Day Smoker    Packs/day: 1.00    Years: 20.00    Pack years: 20.00    Types: Cigarettes  . Smokeless tobacco: Never Used  Substance and Sexual Activity  . Alcohol use: Yes    Alcohol/week: 0.0 standard drinks    Comment: rare  . Drug use: No  . Sexual activity: Yes    Birth control/protection: Coitus interruptus  Lifestyle  . Physical activity:    Days per week: Not on file    Minutes per session: Not on file  . Stress: Not on file  Relationships  . Social connections:    Talks on phone: Not on file    Gets together: Not on file    Attends religious service: Not on file    Active member of club or organization: Not on file     Attends meetings of clubs or organizations: Not on file    Relationship status: Not on file  . Intimate partner violence:    Fear of current or ex partner: Not on file    Emotionally abused: Not on file    Physically abused: Not on file    Forced sexual activity: Not on file  Other Topics Concern  . Not on file  Social History Narrative  . Not on file    Family History  Problem Relation Age of Onset  . Arthritis Mother   . Alcohol abuse Father   . Arthritis Father   . Hyperlipidemia Father   . Hypertension Father   . Diabetes Father   . Leukemia Father   . Personality disorder Sister   . Anxiety disorder Sister   . Diabetes Maternal Grandmother   . Lung cancer Maternal Grandfather   . Alcohol abuse Paternal Grandmother   . Breast cancer Paternal Grandmother   . Alcohol abuse Paternal Grandfather   . Arthritis Paternal Grandfather   . Heart disease Paternal Grandfather   . Hypertension Sister   . Hypertension Brother   . Ovarian cancer Neg Hx   . Colon cancer Neg Hx     The following portions of the patient's history were reviewed and updated as appropriate: allergies, current medications, past family history, past medical history, past social history, past surgical history and problem list.  Review of Systems Review of Systems  Constitutional: Negative.   HENT: Negative.   Eyes: Negative.   Respiratory: Positive for cough.   Cardiovascular: Negative.   Gastrointestinal: Negative.   Genitourinary: Negative.        Menses intervals q. 1 to 2 months  Musculoskeletal: Negative.   Skin: Negative.   Neurological: Negative.   Endo/Heme/Allergies: Negative.   Psychiatric/Behavioral: Negative.       Objective:   BP 138/79   Pulse (!) 102   Ht 5\' 6"  (1.676 m)   Wt (!) 339 lb (153.8 kg)   LMP 03/04/2018   BMI 54.72 kg/m  CONSTITUTIONAL: Well-developed, well-nourished, obese female in no acute distress.  PSYCHIATRIC: Normal mood and affect. Normal behavior.  Normal judgment and thought content. NEUROLGIC: Alert and oriented to person, place, and time. Normal muscle tone coordination. No cranial nerve deficit noted. HENT:  Normocephalic, atraumatic, External right and left ear normal.  EYES: Conjunctivae and EOM are normal. No scleral icterus.  NECK: Normal range of motion, supple, no masses.  Normal thyroid.  SKIN: Skin is warm and dry. No rash noted. Not diaphoretic.  No erythema. No pallor. CARDIOVASCULAR: Normal heart rate noted, regular rhythm, no murmur. RESPIRATORY: Clear to auscultation bilaterally. Effort and breath sounds normal, no problems with respiration noted. BREASTS: Symmetric in size. No masses, skin changes, nipple drainage, or lymphadenopathy. ABDOMEN: Soft, normal bowel sounds, no distention noted.  No tenderness, rebound or guarding.  BLADDER: Normal PELVIC: (Unchanged)  External Genitalia: Normal  BUS: Normal  Vagina: Normal  Cervix: Normal; No cervical motion tenderness; no lesions  Uterus: Mid plane; nonenlarged; difficult to palpate due to body habitus  Adnexa: Normal; nonpalpable and nontender  RV: External hemorrhoids, No Rectal Masses and Normal Sphincter tone  MUSCULOSKELETAL: Normal range of motion. No tenderness.  No cyanosis, clubbing, or edema.  2+ distal pulses. LYMPHATIC: No Axillary, Supraclavicular, or Inguinal Adenopathy.    Assessment:   Annual gynecologic examination 42 y.o. Contraception: coitus interruptus [id number] Problem List Items Addressed This Visit    Morbid obesity with body mass index (BMI) of 50.0 to 59.9 in adult [facility])   Tobacco user   Essential hypertension   History of cesarean section    Other Visit Diagnoses    Well woman exam with routine gynecological exam    -  Primary   Screening for breast cancer        Recent weight loss-30 pounds Plan:  Pap: Due 2020 Mammogram: Ordered Stool Guaiac Testing:  Not Indicated Labs: thru pcp  Routine preventative health maintenance  measures emphasized: Exercise/Diet/Weight control, Tobacco Warnings, Alcohol/Substance use risks and Safe Sex Smoking cessation strongly encouraged; patient to begin NicoDerm patch this week Slow steady weight loss strongly encouraged  Moderate alcohol intake due to family history and father of alcoholism Return to Clinic - 1 Year to be scheduled with Andrea Hull, CMA  Andrea Harms, Andrea Hull   Note: This dictation was prepared with Dragon dictation along with smaller phrase technology. Any transcriptional errors that result from this process are unintentional.

## 2018-03-20 ENCOUNTER — Ambulatory Visit (INDEPENDENT_AMBULATORY_CARE_PROVIDER_SITE_OTHER): Admitting: Family Medicine

## 2018-03-20 ENCOUNTER — Encounter: Payer: Self-pay | Admitting: Obstetrics and Gynecology

## 2018-03-20 ENCOUNTER — Ambulatory Visit (INDEPENDENT_AMBULATORY_CARE_PROVIDER_SITE_OTHER): Admitting: Obstetrics and Gynecology

## 2018-03-20 ENCOUNTER — Encounter: Payer: Self-pay | Admitting: Family Medicine

## 2018-03-20 VITALS — BP 128/78 | HR 83 | Temp 98.5°F | Ht 66.0 in | Wt 339.0 lb

## 2018-03-20 VITALS — BP 138/79 | HR 102 | Ht 66.0 in | Wt 339.0 lb

## 2018-03-20 DIAGNOSIS — Z6841 Body Mass Index (BMI) 40.0 and over, adult: Secondary | ICD-10-CM

## 2018-03-20 DIAGNOSIS — Z0000 Encounter for general adult medical examination without abnormal findings: Secondary | ICD-10-CM

## 2018-03-20 DIAGNOSIS — Z98891 History of uterine scar from previous surgery: Secondary | ICD-10-CM | POA: Diagnosis not present

## 2018-03-20 DIAGNOSIS — Z811 Family history of alcohol abuse and dependence: Secondary | ICD-10-CM

## 2018-03-20 DIAGNOSIS — Z6372 Alcoholism and drug addiction in family: Secondary | ICD-10-CM

## 2018-03-20 DIAGNOSIS — Z720 Tobacco use: Secondary | ICD-10-CM

## 2018-03-20 DIAGNOSIS — Z23 Encounter for immunization: Secondary | ICD-10-CM | POA: Diagnosis not present

## 2018-03-20 DIAGNOSIS — I10 Essential (primary) hypertension: Secondary | ICD-10-CM

## 2018-03-20 DIAGNOSIS — Z01419 Encounter for gynecological examination (general) (routine) without abnormal findings: Secondary | ICD-10-CM | POA: Diagnosis not present

## 2018-03-20 DIAGNOSIS — Z1239 Encounter for other screening for malignant neoplasm of breast: Secondary | ICD-10-CM | POA: Diagnosis not present

## 2018-03-20 NOTE — Progress Notes (Signed)
Dr. Frederico Hamman T. Kynisha Memon, MD, [facility] [place], [address] Phone: [phone] Fax: [phone]  03/20/2018  Patient: Andrea Hull, MRN: [id number], DOB: 1975/12/23, 42 y.o.  Primary Physician:  Owens Loffler, MD   Chief Complaint  Patient presents with  . Annual Exam   Subjective:   Andrea Hull is a 42 y.o. pleasant patient who presents with the following:  Health Maintenance Summary Reviewed and updated, unless pt declines services.  Tobacco History Reviewed. Current smoker Alcohol: No concerns, no excessive use Exercise Habits: Some activity, rec at least 30 mins 5 times a week STD concerns: none Drug Use: None Birth control method: Menses regular: yes Lumps or breast concerns: no Breast Cancer Family History: no  Still smoking - less than a pack  Day  Since September has lost 20 pounds Cooking more at home No fast food Intermittent fasting   Has had a lot of cheese in the last few weeks.   Husband now home every day. Two months after that he got laid off.  ? Going back to work.  Trigs have gone up  Health Maintenance  Topic Date Due  . HIV Screening  11/26/1990  . TETANUS/TDAP  03/01/2026  . INFLUENZA VACCINE  Completed    Immunization History  Administered Date(s) Administered  . Influenza,inj,Quad PF,6+ Mos 04/14/2013, 03/27/2014, 04/07/2015, 03/01/2016, 03/15/2017, 03/20/2018  . Tdap 03/01/2016   Patient Active Problem List   Diagnosis Date Noted  . Acute bronchitis 02/26/2018  . Irregular periods/menstrual cycles 03/06/2017  . Dysmenorrhea 03/06/2017  . Galactorrhea 03/06/2017  . History of cesarean section 03/06/2017  . Morbid obesity with body mass index (BMI) of 50.0 to 59.9 in adult ([facility]) 04/15/2013  . Sleep apnea, obstructive   . Tobacco user   . Essential hypertension   . Arthritis    Past Medical History:  Diagnosis Date  . Arthritis   . Heavy periods   .  Hypertension   . Morbid obesity with BMI of 50.0-59.9, adult ([facility])   . Painful menstrual periods    h/o  . Sleep apnea, obstructive   . Tobacco abuse    Past Surgical History:  Procedure Laterality Date  . Breast Biospy  2013  . CESAREAN SECTION    . CHOLECYSTECTOMY N/A 11/17/2014   Procedure: LAPAROSCOPIC CHOLECYSTECTOMY WITH INTRAOPERATIVE CHOLANGIOGRAM;  Surgeon: Dia Crawford III, MD;  Location: [facility] ORS;  Service: General;  Laterality: N/A;  . DILATION AND CURETTAGE OF UTERUS    . Miscarriage  2008   Social History   Socioeconomic History  . Marital status: Married    Spouse name: Not on file  . Number of children: Not on file  . Years of education: Not on file  . Highest education level: Not on file  Occupational History  . Not on file  Social Needs  . Financial resource strain: Not on file  . Food insecurity:    Worry: Not on file    Inability: Not on file  . Transportation needs:    Medical: Not on file    Non-medical: Not on file  Tobacco Use  . Smoking status: Current Every Day Smoker    Packs/day: 1.00    Years: 20.00    Pack years: 20.00    Types: Cigarettes  . Smokeless tobacco: Never Used  Substance and Sexual Activity  . Alcohol use: Yes    Alcohol/week: 0.0 standard drinks    Comment: rare  .  Drug use: No  . Sexual activity: Yes    Birth control/protection: Coitus interruptus  Lifestyle  . Physical activity:    Days per week: Not on file    Minutes per session: Not on file  . Stress: Not on file  Relationships  . Social connections:    Talks on phone: Not on file    Gets together: Not on file    Attends religious service: Not on file    Active member of club or organization: Not on file    Attends meetings of clubs or organizations: Not on file    Relationship status: Not on file  . Intimate partner violence:    Fear of current or ex partner: Not on file    Emotionally abused: Not on file    Physically abused: Not on file    Forced sexual  activity: Not on file  Other Topics Concern  . Not on file  Social History Narrative  . Not on file   Family History  Problem Relation Age of Onset  . Arthritis Mother   . Alcohol abuse Father   . Arthritis Father   . Hyperlipidemia Father   . Hypertension Father   . Diabetes Father   . Leukemia Father   . Personality disorder Sister   . Anxiety disorder Sister   . Diabetes Maternal Grandmother   . Lung cancer Maternal Grandfather   . Alcohol abuse Paternal Grandmother   . Breast cancer Paternal Grandmother   . Alcohol abuse Paternal Grandfather   . Arthritis Paternal Grandfather   . Heart disease Paternal Grandfather   . Hypertension Sister   . Hypertension Brother   . Ovarian cancer Neg Hx   . Colon cancer Neg Hx    No Known Allergies  Medication list has been reviewed and updated.   General: Denies fever, chills, sweats. No significant weight loss. Eyes: Denies blurring,significant itching ENT: Denies earache, sore throat, and hoarseness.  Cardiovascular: Denies chest pains, palpitations, dyspnea on exertion,  Respiratory: Denies cough, dyspnea at rest,wheeezing Breast: no concerns about lumps GI: Denies nausea, vomiting, diarrhea, constipation, change in bowel habits, abdominal pain, melena, hematochezia GU: Denies dysuria, hematuria, urinary hesitancy, nocturia, denies STD risk, no concerns about discharge Musculoskeletal: Denies back pain, joint pain Derm: Denies rash, itching Neuro: Denies  paresthesias, frequent falls, frequent headaches Psych: Denies depression, anxiety Endocrine: Denies cold intolerance, heat intolerance, polydipsia Heme: Denies enlarged lymph nodes Allergy: No hayfever  Objective:   BP 128/78   Pulse 83   Temp 98.5 F (36.9 C) (Oral)   Ht _0  (1.676 m)   Wt (!) 339 lb (153.8 kg)   LMP 03/04/2018   BMI 54.72 kg/m  No exam data present  GEN: well developed, well nourished, no acute distress Eyes: conjunctiva and lids normal,  PERRLA, EOMI ENT: TM clear, nares clear, oral exam WNL Neck: supple, no lymphadenopathy, no thyromegaly, no JVD Pulm: clear to auscultation and percussion, respiratory effort normal CV: regular rate and rhythm, S1-S2, no murmur, rub or gallop, no bruits Chest: no scars, masses, no lumps BREAST: breast exam declined GI: soft, non-tender; no hepatosplenomegaly, masses; active bowel sounds all quadrants GU: GU exam declined Lymph: no cervical, axillary or inguinal adenopathy MSK: gait normal, muscle tone and strength WNL, no joint swelling, effusions, discoloration, crepitus  SKIN: clear, good turgor, color WNL, no rashes, lesions, or ulcerations Neuro: normal mental status, normal strength, sensation, and motion Psych: alert; oriented to person, place and time, normally interactive and  not anxious or depressed in appearance.   All labs reviewed with patient. Lipids:    Component Value Date/Time   CHOL 175 03/13/2018 0942   TRIG 390.0 (H) 03/13/2018 0942   HDL 33.00 (L) 03/13/2018 0942   LDLDIRECT 79.0 03/13/2018 0942   VLDL 78.0 (H) 03/13/2018 0942   CHOLHDL 5 03/13/2018 0942   CBC: CBC Latest Ref Rng & Units 03/13/2018 03/08/2017 02/25/2016  WBC 4.0 - 10.5 K/uL 15.5(H) 14.6(H) 13.8(H)  Hemoglobin 12.0 - 15.0 g/dL 14.2 14.0 13.9  Hematocrit 36.0 - 46.0 % 41.4 41.7 40.9  Platelets 150.0 - 400.0 K/uL 333.0 304.0 832.5    Basic Metabolic Panel:    Component Value Date/Time   NA 135 03/13/2018 0942   K 4.2 03/13/2018 0942   CL 99 03/13/2018 0942   CO2 28 03/13/2018 0942   BUN 17 03/13/2018 0942   CREATININE 0.90 03/13/2018 0942   GLUCOSE 104 (H) 03/13/2018 0942   CALCIUM 9.6 03/13/2018 0942   Hepatic Function Latest Ref Rng & Units 03/13/2018 03/08/2017 02/25/2016  Total Protein 6.0 - 8.3 g/dL 7.4 7.0 6.9  Albumin 3.5 - 5.2 g/dL 4.2 4.1 3.9  AST 0 - 37 U/L 13 13 36  ALT 0 - 35 U/L 16 15 73(H)  Alk Phosphatase 39 - 117 U/L 60 61 68  Total Bilirubin 0.2 - 1.2 mg/dL 0.6 0.3 0.5    Bilirubin, Direct 0.0 - 0.3 mg/dL 0.1 0.1 0.1    Lab Results  Component Value Date   TSH 1.86 03/13/2018   No results found.  Assessment and Plan:   Healthcare maintenance  Need for prophylactic vaccination and inoculation against influenza - Plan: Flu Vaccine QUAD 6+ mos PF IM (Fluarix Quad PF)  Working on weight loss - has lost 20 pounds GYN appt later today  Trying to cut back on her smoking  Health Maintenance Exam: The patient's preventative maintenance and recommended screening tests for an annual wellness exam were reviewed in full today. Brought up to date unless services declined.  Counselled on the importance of diet, exercise, and its role in overall health and mortality. The patient's FH and SH was reviewed, including their home life, tobacco status, and drug and alcohol status.  Follow-up in 1 year for physical exam or additional follow-up below.  Follow-up: No follow-ups on file. Or follow-up in 1 year if not noted.  Orders Placed This Encounter  Procedures  . Flu Vaccine QUAD 6+ mos PF IM (Fluarix Quad PF)    Signed,  Rileigh Kawashima T. Armani Brar, MD   Allergies as of 03/20/2018   No Known Allergies     Medication List        Accurate as of 03/20/18 10:59 AM. Always use your most recent med list.          diclofenac 75 MG EC tablet Commonly known as:  VOLTAREN Take 1 tablet (75 mg total) by mouth 2 (two) times daily.   famotidine 20 MG tablet Commonly known as:  PEPCID Take 20 mg by mouth daily as needed.   lisinopril-hydrochlorothiazide 10-12.5 MG tablet Commonly known as:  PRINZIDE,ZESTORETIC Take 1 tablet by mouth daily.   MELATONIN PO Take by mouth.

## 2018-03-20 NOTE — Patient Instructions (Signed)
1.  No Pap smear is done.  Next Pap smear is due 2020. 2.  Mammogram is ordered. 3.  Screening labs are to be obtained through primary care. 4.  Continue with healthy eating, exercise, and control weight loss.  Weight loss goal is 1 pound per month. 5.  Smoking cessation strongly encouraged; begin NicoDerm patch as prescribed by primary care. 6.  Monitor alcohol intake restricting intake to 1 drink a day, no more than 7 drinks in 1 week, no more than 3 drinks at one sitting. 7.  Return in 1 year for annual exam  Health Maintenance, Female Adopting a healthy lifestyle and getting preventive care can go a long way to promote health and wellness. Talk with your health care provider about what schedule of regular examinations is right for you. This is a good chance for you to check in with your provider about disease prevention and staying healthy. In between checkups, there are plenty of things you can do on your own. Experts have done a lot of research about which lifestyle changes and preventive measures are most likely to keep you healthy. Ask your health care provider for more information. Weight and diet Eat a healthy diet  Be sure to include plenty of vegetables, fruits, low-fat dairy products, and lean protein.  Do not eat a lot of foods high in solid fats, added sugars, or salt.  Get regular exercise. This is one of the most important things you can do for your health. ? Most adults should exercise for at least 150 minutes each week. The exercise should increase your heart rate and make you sweat (moderate-intensity exercise). ? Most adults should also do strengthening exercises at least twice a week. This is in addition to the moderate-intensity exercise.  Maintain a healthy weight  Body mass index (BMI) is a measurement that can be used to identify possible weight problems. It estimates body fat based on height and weight. Your health care provider can help determine your BMI and help  you achieve or maintain a healthy weight.  For females 75 years of age and older: ? A BMI below 18.5 is considered underweight. ? A BMI of 18.5 to 24.9 is normal. ? A BMI of 25 to 29.9 is considered overweight. ? A BMI of 30 and above is considered obese.  Watch levels of cholesterol and blood lipids  You should start having your blood tested for lipids and cholesterol at 42 years of age, then have this test every 5 years.  You may need to have your cholesterol levels checked more often if: ? Your lipid or cholesterol levels are high. ? You are older than 42 years of age. ? You are at high risk for heart disease.  Cancer screening Lung Cancer  Lung cancer screening is recommended for adults 65-54 years old who are at high risk for lung cancer because of a history of smoking.  A yearly low-dose CT scan of the lungs is recommended for people who: ? Currently smoke. ? Have quit within the past 15 years. ? Have at least a 30-pack-year history of smoking. A pack year is smoking an average of one pack of cigarettes a day for 1 year.  Yearly screening should continue until it has been 15 years since you quit.  Yearly screening should stop if you develop a health problem that would prevent you from having lung cancer treatment.  Breast Cancer  Practice breast self-awareness. This means understanding how your breasts normally appear  and feel.  It also means doing regular breast self-exams. Let your health care provider know about any changes, no matter how small.  If you are in your 20s or 30s, you should have a clinical breast exam (CBE) by a health care provider every 1-3 years as part of a regular health exam.  If you are 7 or older, have a CBE every year. Also consider having a breast X-ray (mammogram) every year.  If you have a family history of breast cancer, talk to your health care provider about genetic screening.  If you are at high risk for breast cancer, talk to your  health care provider about having an MRI and a mammogram every year.  Breast cancer gene (BRCA) assessment is recommended for women who have family members with BRCA-related cancers. BRCA-related cancers include: ? Breast. ? Ovarian. ? Tubal. ? Peritoneal cancers.  Results of the assessment will determine the need for genetic counseling and BRCA1 and BRCA2 testing.  Cervical Cancer Your health care provider may recommend that you be screened regularly for cancer of the pelvic organs (ovaries, uterus, and vagina). This screening involves a pelvic examination, including checking for microscopic changes to the surface of your cervix (Pap test). You may be encouraged to have this screening done every 3 years, beginning at age 55.  For women ages 14-65, health care providers may recommend pelvic exams and Pap testing every 3 years, or they may recommend the Pap and pelvic exam, combined with testing for human papilloma virus (HPV), every 5 years. Some types of HPV increase your risk of cervical cancer. Testing for HPV may also be done on women of any age with unclear Pap test results.  Other health care providers may not recommend any screening for nonpregnant women who are considered low risk for pelvic cancer and who do not have symptoms. Ask your health care provider if a screening pelvic exam is right for you.  If you have had past treatment for cervical cancer or a condition that could lead to cancer, you need Pap tests and screening for cancer for at least 20 years after your treatment. If Pap tests have been discontinued, your risk factors (such as having a new sexual partner) need to be reassessed to determine if screening should resume. Some women have medical problems that increase the chance of getting cervical cancer. In these cases, your health care provider may recommend more frequent screening and Pap tests.  Colorectal Cancer  This type of cancer can be detected and often  prevented.  Routine colorectal cancer screening usually begins at 42 years of age and continues through 42 years of age.  Your health care provider may recommend screening at an earlier age if you have risk factors for colon cancer.  Your health care provider may also recommend using home test kits to check for hidden blood in the stool.  A small camera at the end of a tube can be used to examine your colon directly (sigmoidoscopy or colonoscopy). This is done to check for the earliest forms of colorectal cancer.  Routine screening usually begins at age 37.  Direct examination of the colon should be repeated every 5-10 years through 42 years of age. However, you may need to be screened more often if early forms of precancerous polyps or small growths are found.  Skin Cancer  Check your skin from head to toe regularly.  Tell your health care provider about any new moles or changes in moles, especially if  there is a change in a mole's shape or color.  Also tell your health care provider if you have a mole that is larger than the size of a pencil eraser.  Always use sunscreen. Apply sunscreen liberally and repeatedly throughout the day.  Protect yourself by wearing long sleeves, pants, a wide-brimmed hat, and sunglasses whenever you are outside.  Heart disease, diabetes, and high blood pressure  High blood pressure causes heart disease and increases the risk of stroke. High blood pressure is more likely to develop in: ? People who have blood pressure in the high end of the normal range (130-139/85-89 mm Hg). ? People who are overweight or obese. ? People who are African American.  If you are 27-51 years of age, have your blood pressure checked every 3-5 years. If you are 18 years of age or older, have your blood pressure checked every year. You should have your blood pressure measured twice-once when you are at a hospital or clinic, and once when you are not at a hospital or clinic.  Record the average of the two measurements. To check your blood pressure when you are not at a hospital or clinic, you can use: ? An automated blood pressure machine at a pharmacy. ? A home blood pressure monitor.  If you are between 54 years and 4 years old, ask your health care provider if you should take aspirin to prevent strokes.  Have regular diabetes screenings. This involves taking a blood sample to check your fasting blood sugar level. ? If you are at a normal weight and have a low risk for diabetes, have this test once every three years after 42 years of age. ? If you are overweight and have a high risk for diabetes, consider being tested at a younger age or more often. Preventing infection Hepatitis B  If you have a higher risk for hepatitis B, you should be screened for this virus. You are considered at high risk for hepatitis B if: ? You were born in a country where hepatitis B is common. Ask your health care provider which countries are considered high risk. ? Your parents were born in a high-risk country, and you have not been immunized against hepatitis B (hepatitis B vaccine). ? You have HIV or AIDS. ? You use needles to inject street drugs. ? You live with someone who has hepatitis B. ? You have had sex with someone who has hepatitis B. ? You get hemodialysis treatment. ? You take certain medicines for conditions, including cancer, organ transplantation, and autoimmune conditions.  Hepatitis C  Blood testing is recommended for: ? Everyone born from [birth year] through 1965. ? Anyone with known risk factors for hepatitis C.  Sexually transmitted infections (STIs)  You should be screened for sexually transmitted infections (STIs) including gonorrhea and chlamydia if: ? You are sexually active and are younger than 42 years of age. ? You are older than 42 years of age and your health care provider tells you that you are at risk for this type of infection. ? Your sexual  activity has changed since you were last screened and you are at an increased risk for chlamydia or gonorrhea. Ask your health care provider if you are at risk.  If you do not have HIV, but are at risk, it may be recommended that you take a prescription medicine daily to prevent HIV infection. This is called pre-exposure prophylaxis (PrEP). You are considered at risk if: ? You are sexually active and  do not regularly use condoms or know the HIV status of your partner(s). ? You take drugs by injection. ? You are sexually active with a partner who has HIV.  Talk with your health care provider about whether you are at high risk of being infected with HIV. If you choose to begin PrEP, you should first be tested for HIV. You should then be tested every 3 months for as long as you are taking PrEP. Pregnancy  If you are premenopausal and you may become pregnant, ask your health care provider about preconception counseling.  If you may become pregnant, take 400 to 800 micrograms (mcg) of folic acid every day.  If you want to prevent pregnancy, talk to your health care provider about birth control (contraception). Osteoporosis and menopause  Osteoporosis is a disease in which the bones lose minerals and strength with aging. This can result in serious bone fractures. Your risk for osteoporosis can be identified using a bone density scan.  If you are 60 years of age or older, or if you are at risk for osteoporosis and fractures, ask your health care provider if you should be screened.  Ask your health care provider whether you should take a calcium or vitamin D supplement to lower your risk for osteoporosis.  Menopause may have certain physical symptoms and risks.  Hormone replacement therapy may reduce some of these symptoms and risks. Talk to your health care provider about whether hormone replacement therapy is right for you. Follow these instructions at home:  Schedule regular health, dental,  and eye exams.  Stay current with your immunizations.  Do not use any tobacco products including cigarettes, chewing tobacco, or electronic cigarettes.  If you are pregnant, do not drink alcohol.  If you are breastfeeding, limit how much and how often you drink alcohol.  Limit alcohol intake to no more than 1 drink per day for nonpregnant women. One drink equals 12 ounces of beer, 5 ounces of wine, or 1 ounces of hard liquor.  Do not use street drugs.  Do not share needles.  Ask your health care provider for help if you need support or information about quitting drugs.  Tell your health care provider if you often feel depressed.  Tell your health care provider if you have ever been abused or do not feel safe at home. This information is not intended to replace advice given to you by your health care provider. Make sure you discuss any questions you have with your health care provider. Document Released: 12/19/2010 Document Revised: 11/11/2015 Document Reviewed: 03/09/2015 Elsevier Interactive Patient Education  [employer].

## 2018-03-21 ENCOUNTER — Encounter: Payer: Self-pay | Admitting: Family Medicine

## 2018-03-22 ENCOUNTER — Other Ambulatory Visit: Payer: Self-pay | Admitting: Obstetrics and Gynecology

## 2018-03-22 ENCOUNTER — Other Ambulatory Visit (HOSPITAL_COMMUNITY): Payer: Self-pay | Admitting: Diagnostic Radiology

## 2018-03-22 DIAGNOSIS — Z1231 Encounter for screening mammogram for malignant neoplasm of breast: Secondary | ICD-10-CM

## 2018-04-29 ENCOUNTER — Encounter: Payer: Self-pay | Admitting: Pulmonary Disease

## 2018-04-29 ENCOUNTER — Ambulatory Visit
Admission: RE | Admit: 2018-04-29 | Discharge: 2018-04-29 | Disposition: A | Discharge status: Home / Self Care | Source: Ambulatory Visit | Attending: Obstetrics and Gynecology | Admitting: Obstetrics and Gynecology

## 2018-04-29 ENCOUNTER — Ambulatory Visit (INDEPENDENT_AMBULATORY_CARE_PROVIDER_SITE_OTHER): Admitting: Pulmonary Disease

## 2018-04-29 VITALS — BP 160/80 | HR 120 | Ht 66.0 in | Wt 343.0 lb

## 2018-04-29 DIAGNOSIS — G4733 Obstructive sleep apnea (adult) (pediatric): Secondary | ICD-10-CM

## 2018-04-29 DIAGNOSIS — Z1239 Encounter for other screening for malignant neoplasm of breast: Secondary | ICD-10-CM

## 2018-04-29 DIAGNOSIS — Z720 Tobacco use: Secondary | ICD-10-CM

## 2018-04-29 DIAGNOSIS — Z9989 Dependence on other enabling machines and devices: Secondary | ICD-10-CM | POA: Diagnosis not present

## 2018-04-29 NOTE — Progress Notes (Signed)
   Subjective:    Patient ID: Cristi Loron, female    DOB: May 01, 1976, 42 y.o.   MRN: [id number]  HPI    Review of Systems  Constitutional: Negative for fever and unexpected weight change.  HENT: Negative for congestion, dental problem, ear pain, nosebleeds, postnasal drip, rhinorrhea, sinus pressure, sneezing, sore throat and trouble swallowing.   Eyes: Negative for redness and itching.  Respiratory: Negative for cough, chest tightness, shortness of breath and wheezing.   Cardiovascular: Positive for leg swelling. Negative for palpitations.  Gastrointestinal: Negative for nausea and vomiting.  Genitourinary: Negative for dysuria.  Musculoskeletal: Positive for joint swelling.  Skin: Negative for rash.  Allergic/Immunologic: Negative.  Negative for environmental allergies, food allergies and immunocompromised state.  Neurological: Negative for headaches.  Hematological: Bruises/bleeds easily.  Psychiatric/Behavioral: Negative for dysphoric mood. The patient is not nervous/anxious.        Objective:   Physical Exam        Assessment & Plan:

## 2018-04-29 NOTE — Patient Instructions (Signed)
Will arrange for new CPAP mask and supplies  Follow up in 1 year 

## 2018-04-29 NOTE — Progress Notes (Signed)
[facility]  Chief Complaint  Patient presents with  . sleep consult    Pt has been on cpap machine for over 10 yrs, in need of new cpap machine. DME-[facility]    Constitutional:  BP (!) 160/80 (BP Location: Left Arm, Cuff Size: Normal)   Pulse (!) 120   Ht 5\' 6"  (1.676 m)   Wt (!) 343 lb (155.6 kg)   SpO2 95%   BMI 55.36 kg/m   Past Medical History:  Arthritis, Menorrhagia, HTN  Brief Summary:  Andrea Hull is a 42 y.o. female smoker with obstructive sleep apnea.  Last seen in 2016.  Had home sleep study.  Found to have severe sleep apnea.  Got new CPAP in 2016.  No issues with mask fit.  Uses nasal mask.  Doesn't need to use humidifier.  Was told by her DME she needed f/u to keep up with supplies.  She has started intermittent fasting diet and has lost about 20 lbs.  BP is up today.  She says when she normally checks this, it is in normal range.  She is not having sinus congestion, sore throat, dry mouth, or aerophagia.  Physical Exam:   Appearance - well kempt   ENMT - clear nasal mucosa, midline nasal  septum, no oral exudates, no LAN, trachea midline, MP 3, enlarged tongue  Respiratory - normal chest wall, normal respiratory effort, no accessory muscle use, no wheeze/rales  CV - s1s2 regular rate and rhythm, no murmurs, no peripheral edema, radial pulses symmetric  GI - soft, non tender, no masses  Lymph - no adenopathy noted in neck and axillary areas  MSK - normal gait  Ext - no cyanosis, clubbing, or joint inflammation noted  Skin - no rashes, lesions, or ulcers  Neuro - normal strength, oriented x 3  Psych - normal mood and affect   Assessment/Plan:   Obstructive sleep apnea. - she is compliant with CPAP and reports benefit - continue auto CPAP - will arrange for replacement mask and supplies  Obesity. - discussed options to assist with weight loss  Tobacco abuse. - reviewed options to assist with smoking  cessation  Elevated blood pressure. - advised her to f/u with PCP if this remains elevated   Patient Instructions  Will arrange for new CPAP mask and supplies  Follow up in 1 year    Coralyn Helling, MD [facility] Pager: [phone] 04/29/2018, 4:56 PM  Flow Sheet    Sleep tests:  HST 02/11/15 >> AHI 51.8, SpO2 low 87%   Review of Systems:  Review of Systems  Constitutional: Negative for fever and unexpected weight change.  HENT: Negative for congestion, dental problem, ear pain, nosebleeds, postnasal drip, rhinorrhea, sinus pressure, sneezing, sore throat and trouble swallowing.   Eyes: Negative for redness and itching.  Respiratory: Negative for cough, chest tightness, shortness of breath and wheezing.   Cardiovascular: Positive for leg swelling. Negative for palpitations.  Gastrointestinal: Negative for nausea and vomiting.  Genitourinary: Negative for dysuria.  Musculoskeletal: Positive for joint swelling.  Skin: Negative for rash.  Allergic/Immunologic: Negative.  Negative for environmental allergies, food allergies and immunocompromised state.  Neurological: Negative for headaches.  Hematological: Bruises/bleeds easily.  Psychiatric/Behavioral: Negative for dysphoric mood. The patient is not nervous/anxious.    Medications:   Allergies as of 04/29/2018   No Known Allergies     Medication List        Accurate as of 04/29/18  4:56 PM.  Always use your most recent med list.          diclofenac 75 MG EC tablet Commonly known as:  VOLTAREN Take 1 tablet (75 mg total) by mouth 2 (two) times daily.   famotidine 20 MG tablet Commonly known as:  PEPCID Take 20 mg by mouth daily as needed.   lisinopril-hydrochlorothiazide 10-12.5 MG tablet Commonly known as:  PRINZIDE,ZESTORETIC Take 1 tablet by mouth daily.   MELATONIN PO Take by mouth.       Past Surgical History:  She  has a past surgical history that includes Cesarean section;  Breast Biospy (2013); Miscarriage (2008); Cholecystectomy (N/A, 11/17/2014); and Dilation and curettage of uterus.  Family History:  Her family history includes Alcohol abuse in her father, paternal grandfather, and paternal grandmother; Anxiety disorder in her sister; Arthritis in her father, mother, and paternal grandfather; Breast cancer in her paternal grandmother; Diabetes in her father and maternal grandmother; Heart disease in her paternal grandfather; Hyperlipidemia in her father; Hypertension in her brother, father, and sister; Leukemia in her father; Lung cancer in her maternal grandfather; Personality disorder in her sister.  Social History:  She  reports that she has been smoking cigarettes. She has a 20.00 pack-year smoking history. She has never used smokeless tobacco. She reports that she drinks alcohol. She reports that she does not use drugs.

## 2018-05-17 ENCOUNTER — Other Ambulatory Visit: Payer: Self-pay | Admitting: Family Medicine

## 2018-05-20 NOTE — Telephone Encounter (Signed)
Last office visit 03/20/2018 for CPE.  Last refilled 01/09/2018 for #60 with 3 refills.  No future appointments.

## 2018-07-10 ENCOUNTER — Other Ambulatory Visit: Payer: Self-pay | Admitting: Family Medicine

## 2018-08-01 ENCOUNTER — Ambulatory Visit (INDEPENDENT_AMBULATORY_CARE_PROVIDER_SITE_OTHER): Admitting: Family Medicine

## 2018-08-01 ENCOUNTER — Encounter: Payer: Self-pay | Admitting: Family Medicine

## 2018-08-01 VITALS — BP 148/82 | HR 92 | Temp 98.1°F | Resp 20 | Ht 66.0 in | Wt 339.0 lb

## 2018-08-01 DIAGNOSIS — J0140 Acute pansinusitis, unspecified: Secondary | ICD-10-CM | POA: Diagnosis not present

## 2018-08-01 MED ORDER — AMOXICILLIN-POT CLAVULANATE 875-125 MG PO TABS: 1.0000 | ORAL_TABLET | Freq: Two times a day (BID) | ORAL | 0 refills | Status: AC

## 2018-08-01 NOTE — Patient Instructions (Signed)
  1. Drink plenty of fluids 2. Get lots of rest  Sinus Congestion 1) Neti Pot (Saline rinse) -- 2 times day -- if tolerated 2) Flonase (Store Brand ok) - once daily 3) Over the counter congestion medications  Cough 1) Cough drops can be helpful 2) Nyquil (or nighttime cough medication) 3) Honey is proven to be one of the best cough medications   Sore Throat 1) Honey as above, cough drops 2) Ibuprofen or Aleve can be helpful 3) Salt water Gargles  If you develop fevers (Temperature >100.4), chills, worsening symptoms or symptoms lasting longer than 10 days return to clinic.    

## 2018-08-01 NOTE — Progress Notes (Signed)
Subjective:     Andrea Hull is a 43 y.o. female presenting for URI (x 3 weeks. Right ear is clogged, cough-occasionally productive with greenish phlegm, post nasal drainage, runny nose, sneezing, sinus pressure. No sore throat, no body aches, no fever. Has taking Nyquil, nasal spray and decongestant in the past 2 to 3 days)     URI   This is a new problem. The current episode started 1 to 4 weeks ago. The problem has been gradually worsening. There has been no fever. Associated symptoms include coughing, ear pain, a plugged ear sensation, rhinorrhea, sneezing and vomiting (with coughing episodes). Pertinent negatives include no abdominal pain, diarrhea, headaches, nausea or sore throat. She has tried decongestant and antihistamine (nasal spray) for the symptoms. The treatment provided no relief.     Review of Systems  HENT: Positive for ear pain, hearing loss, postnasal drip, rhinorrhea, sinus pressure and sneezing. Negative for sore throat.   Respiratory: Positive for cough.   Gastrointestinal: Positive for vomiting (with coughing episodes). Negative for abdominal pain, diarrhea and nausea.  Musculoskeletal: Negative for arthralgias and myalgias.  Neurological: Negative for headaches.     Social History   Tobacco Use  Smoking Status Current Every Day Smoker  . Packs/day: 1.00  . Years: 20.00  . Pack years: 20.00  . Types: Cigarettes  Smokeless Tobacco Never Used        Objective:    BP Readings from Last 3 Encounters:  08/01/18 (!) 148/82  04/29/18 (!) 160/80  03/20/18 138/79   Wt Readings from Last 3 Encounters:  08/01/18 (!) 339 lb (153.8 kg)  04/29/18 (!) 343 lb (155.6 kg)  03/20/18 (!) 339 lb (153.8 kg)    BP (!) 148/82   Pulse 92   Temp 98.1 F (36.7 C)   Resp 20   Ht 5\' 6"  (1.676 m)   Wt (!) 339 lb (153.8 kg)   LMP 07/21/2018   SpO2 97%   BMI 54.72 kg/m    Physical Exam Constitutional:      General: She is not in acute distress.  Appearance: She is well-developed. She is not diaphoretic.  HENT:     Head: Normocephalic and atraumatic.     Right Ear: Ear canal normal. A middle ear effusion is present. Tympanic membrane is not erythematous, retracted or bulging.     Left Ear: Ear canal normal. A middle ear effusion is present. Tympanic membrane is not erythematous, retracted or bulging.     Nose: Mucosal edema and rhinorrhea present.     Right Sinus: No maxillary sinus tenderness or frontal sinus tenderness.     Left Sinus: No maxillary sinus tenderness or frontal sinus tenderness.     Mouth/Throat:     Pharynx: Uvula midline. Posterior oropharyngeal erythema present. No oropharyngeal exudate.     Tonsils: Swelling: 0 on the right. 0 on the left.  Eyes:     General: No scleral icterus.    Conjunctiva/sclera: Conjunctivae normal.  Neck:     Musculoskeletal: Neck supple.  Cardiovascular:     Rate and Rhythm: Normal rate and regular rhythm.     Heart sounds: Normal heart sounds. No murmur.  Pulmonary:     Effort: Pulmonary effort is normal. No respiratory distress.     Breath sounds: Normal breath sounds.  Lymphadenopathy:     Cervical: No cervical adenopathy.  Skin:    General: Skin is warm and dry.     Capillary Refill: Capillary refill takes less than  2 seconds.  Neurological:     Mental Status: She is alert.           Assessment & Plan:   Problem List Items Addressed This Visit    None    Visit Diagnoses    Acute non-recurrent pansinusitis    -  Primary   Relevant Medications   amoxicillin-clavulanate (AUGMENTIN) 875-125 MG tablet     Given duration of symptoms suspect infection. Abx as above Symptomatic care  Return if symptoms worsen or fail to improve.  Lynnda Child, MD

## 2018-08-16 ENCOUNTER — Telehealth: Payer: Self-pay | Admitting: Family Medicine

## 2018-08-16 NOTE — Telephone Encounter (Signed)
Pt needs appointment for re-evaluation.

## 2018-08-16 NOTE — Telephone Encounter (Signed)
I spoke with pt and set up [name] appt with PCP.

## 2018-08-16 NOTE — Telephone Encounter (Signed)
Pt was seen 2/13, has finished amoxicillin started feeling better at day 4 but since finishing 2/21 has started getting the same symptoms. She is requesting zithromax- it has worked for her in the past. Currently- she has sinus pressure, nasal drainage causing cough, ears are stopped up.

## 2018-08-18 NOTE — Progress Notes (Signed)
Dr. Karleen Hampshire T. Davey Bergsma, MD, CAQ Sports Medicine Primary Care and Sports Medicine [address] [place], [address] Phone: [phone] Fax: [phone]  08/19/2018  Patient: Andrea Hull, MRN: [id number], DOB: 1976/03/18, 43 y.o.  Primary Physician:  Hannah Beat, MD   Chief Complaint  Patient presents with  . Cough    seen by Dr. Selena Batten 08/01/18. Not any better  . Nasal Congestion  . Sinus Drainage   Subjective:   Andrea Hull is a 43 y.o. very pleasant female patient who presents with the following:  She was seen by Dr. Selena Batten on 08/01/2018 and dx with sinusitis and placed on Augmentin, and she has had a return on symptoms.  Symptoms seem to be localizing to the right maxillary sinus.  Some ear fullness.  A lot of nasal congestion and postnasal drip.  Also coughing some.  No nausea, vomiting, or diarrhea  r max, worse in am Cough  Past Medical History, Surgical History, Social History, Family History, Problem List, Medications, and Allergies have been reviewed and updated if relevant.  Patient Active Problem List   Diagnosis Date Noted  . Irregular periods/menstrual cycles 03/06/2017  . Dysmenorrhea 03/06/2017  . Galactorrhea 03/06/2017  . History of cesarean section 03/06/2017  . Morbid obesity with body mass index (BMI) of 50.0 to 59.9 in adult (HCC) 04/15/2013  . Sleep apnea, obstructive   . Tobacco user   . Essential hypertension   . Arthritis     Past Medical History:  Diagnosis Date  . Arthritis   . Heavy periods   . Hypertension   . Morbid obesity with BMI of 50.0-59.9, adult (HCC)   . Painful menstrual periods    h/o  . Sleep apnea, obstructive   . Tobacco abuse     Past Surgical History:  Procedure Laterality Date  . Breast Biospy  2013  . CESAREAN SECTION    . CHOLECYSTECTOMY N/A 11/17/2014   Procedure: LAPAROSCOPIC CHOLECYSTECTOMY WITH INTRAOPERATIVE CHOLANGIOGRAM;  Surgeon: Tiney Rouge III, MD;  Location: [facility] ORS;  Service: General;   Laterality: N/A;  . DILATION AND CURETTAGE OF UTERUS    . Miscarriage  2008    Social History   Socioeconomic History  . Marital status: Married    Spouse name: Not on file  . Number of children: Not on file  . Years of education: Not on file  . Highest education level: Not on file  Occupational History  . Not on file  Social Needs  . Financial resource strain: Not on file  . Food insecurity:    Worry: Not on file    Inability: Not on file  . Transportation needs:    Medical: Not on file    Non-medical: Not on file  Tobacco Use  . Smoking status: Current Every Day Smoker    Packs/day: 1.00    Years: 20.00    Pack years: 20.00    Types: Cigarettes  . Smokeless tobacco: Never Used  Substance and Sexual Activity  . Alcohol use: Yes    Alcohol/week: 0.0 standard drinks    Comment: rare  . Drug use: No  . Sexual activity: Yes    Birth control/protection: Coitus interruptus  Lifestyle  . Physical activity:    Days per week: 0 days    Minutes per session: 0 min  . Stress: Not on file  Relationships  . Social connections:    Talks on phone: Not on file    Gets together: Not on  file    Attends religious service: Not on file    Active member of club or organization: Not on file    Attends meetings of clubs or organizations: Not on file    Relationship status: Not on file  . Intimate partner violence:    Fear of current or ex partner: Not on file    Emotionally abused: Not on file    Physically abused: Not on file    Forced sexual activity: Not on file  Other Topics Concern  . Not on file  Social History Narrative  . Not on file    Family History  Problem Relation Age of Onset  . Arthritis Mother   . Alcohol abuse Father   . Arthritis Father   . Hyperlipidemia Father   . Hypertension Father   . Diabetes Father   . Leukemia Father   . Personality disorder Sister   . Anxiety disorder Sister   . Diabetes Maternal Grandmother   . Lung cancer Maternal  Grandfather   . Alcohol abuse Paternal Grandmother   . Breast cancer Paternal Grandmother   . Alcohol abuse Paternal Grandfather   . Arthritis Paternal Grandfather   . Heart disease Paternal Grandfather   . Hypertension Sister   . Hypertension Brother   . Ovarian cancer Neg Hx   . Colon cancer Neg Hx     No Known Allergies  Medication list reviewed and updated in full in [facility].  ROS: GEN: Acute illness details above GI: Tolerating PO intake GU: maintaining adequate hydration and urination Pulm: No SOB Interactive and getting along well at home.  Otherwise, ROS is as per the HPI.  Objective:   BP 140/80   Pulse 96   Temp 98.5 F (36.9 C) (Oral)   Ht 5\' 6"  (1.676 m)   Wt (!) 346 lb 4 oz (157.1 kg)   LMP 07/21/2018   SpO2 96%   BMI 55.89 kg/m    Gen: WDWN, NAD; alert,appropriate and cooperative throughout exam    HEENT: Normocephalic and atraumatic. Throat clear, w/o exudate, no LAD, R TM clear, L TM - good landmarks, No fluid present. rhinnorhea.  Left frontal and maxillary sinuses: non Tender Right frontal and maxillary sinuses: Tender max   Neck: No ant or post LAD  CV: RRR, No M/G/R  Pulm: Breathing comfortably in no resp distress. no w/c/r  Abd: S,NT,ND,+BS Extr: no c/c/e Psych: full affect, pleasant    Laboratory and Imaging Data:  Assessment and Plan:   Acute non-recurrent pansinusitis  Change antibiotics, continue supportive care.   I informed the of an 8-fold increased risk of achilles tendon rupture, increased risk of rotator cuff tear, and increased risk of tendon injury in general associated with fluoroquinolones. The patient indicated their understanding.   Follow-up: No follow-ups on file.  Meds ordered this encounter  Medications  . levofloxacin (LEVAQUIN) 500 MG tablet    Sig: Take 1 tablet (500 mg total) by mouth daily for 10 days.    Dispense:  10 tablet    Refill:  0   No orders of the defined types were placed in this  encounter.   Signed,  Elpidio Galea. Litisha Guagliardo, MD   Outpatient Encounter Medications as of 08/19/2018  Medication Sig  . diclofenac (VOLTAREN) 75 MG EC tablet TAKE 1 TABLET(75 MG) BY MOUTH TWICE DAILY  . famotidine (PEPCID) 20 MG tablet Take 20 mg by mouth daily as needed.   Marland Kitchen lisinopril-hydrochlorothiazide (PRINZIDE,ZESTORETIC) 10-12.5 MG tablet TAKE 1  TABLET BY MOUTH DAILY  . MELATONIN PO Take by mouth.  . levofloxacin (LEVAQUIN) 500 MG tablet Take 1 tablet (500 mg total) by mouth daily for 10 days.   No facility-administered encounter medications on file as of 08/19/2018.

## 2018-08-19 ENCOUNTER — Ambulatory Visit (INDEPENDENT_AMBULATORY_CARE_PROVIDER_SITE_OTHER): Admitting: Family Medicine

## 2018-08-19 ENCOUNTER — Encounter: Payer: Self-pay | Admitting: Family Medicine

## 2018-08-19 VITALS — BP 140/80 | HR 96 | Temp 98.5°F | Ht 66.0 in | Wt 346.2 lb

## 2018-08-19 DIAGNOSIS — J0140 Acute pansinusitis, unspecified: Secondary | ICD-10-CM

## 2018-08-19 MED ORDER — LEVOFLOXACIN 500 MG PO TABS: 500.0000 mg | ORAL_TABLET | Freq: Every day | ORAL | 0 refills | Status: AC

## 2018-08-24 ENCOUNTER — Encounter: Payer: Self-pay | Admitting: Family Medicine

## 2018-08-26 MED ORDER — AZITHROMYCIN 250 MG PO TABS: ORAL_TABLET | ORAL | 0 refills | Status: DC

## 2018-08-26 NOTE — Telephone Encounter (Signed)
I am very sorry to hear that.  Joint pains like that can happen with Levaquin - it is pretty rare.  I can only think of 1 other case.    azithromycin 2 tabs po on day 1, then 1 tab po for 4 days

## 2018-09-21 IMAGING — MG 2D DIGITAL SCREENING BILATERAL MAMMOGRAM WITH CAD AND ADJUNCT TO
8 of 16 series · 8 of 32 positions shown · non-contrast
Comparison: None.

CLINICAL DATA: Screening.

EXAM:
2D DIGITAL SCREENING BILATERAL MAMMOGRAM WITH CAD AND ADJUNCT TOMO

[L MLO (1 of 2)]
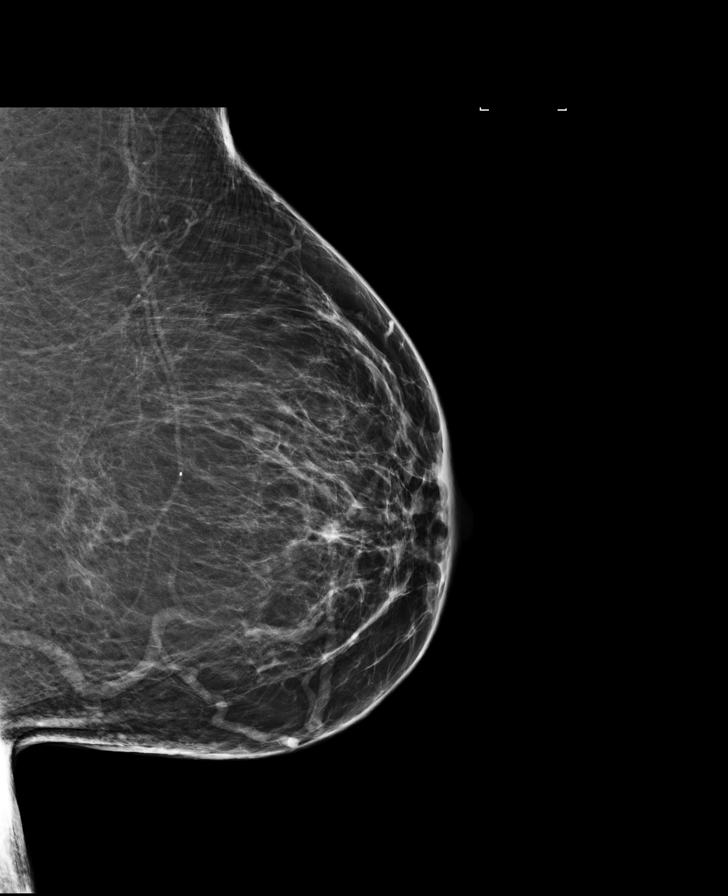

[R XCCL]
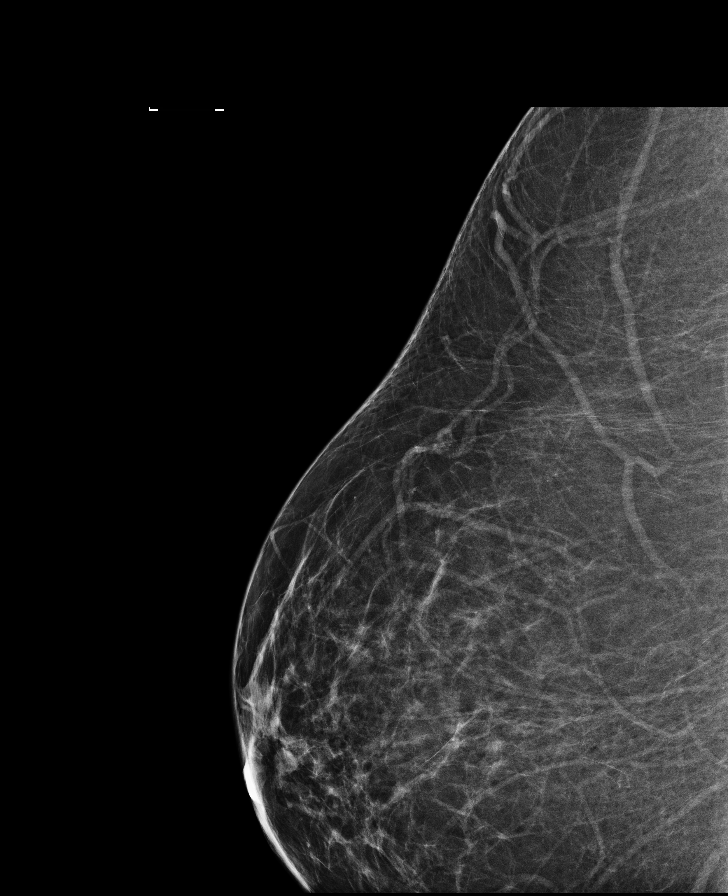

[L XCCL]
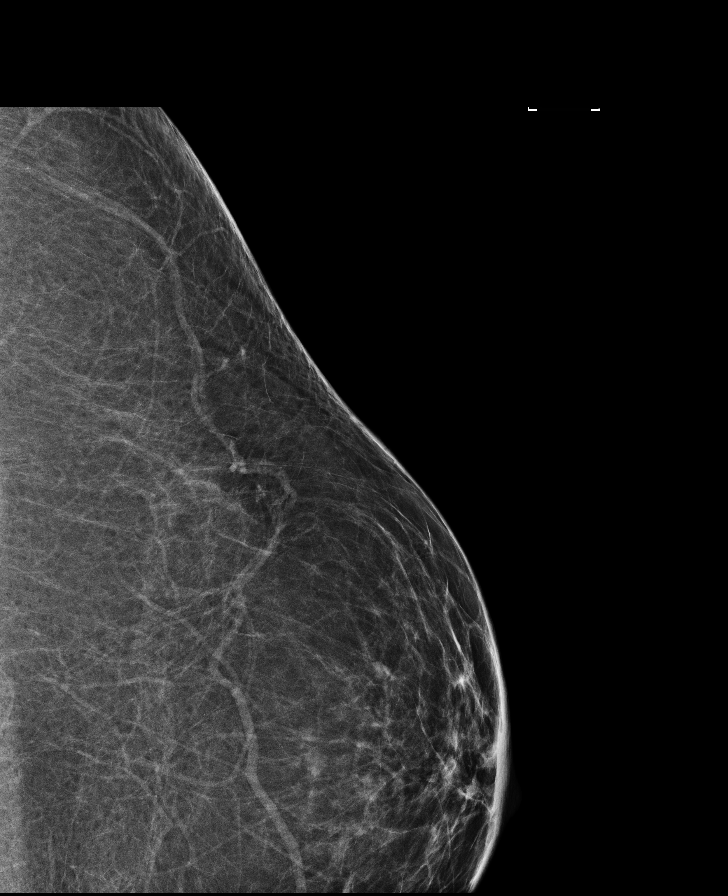

[R MLO (1 of 2)]
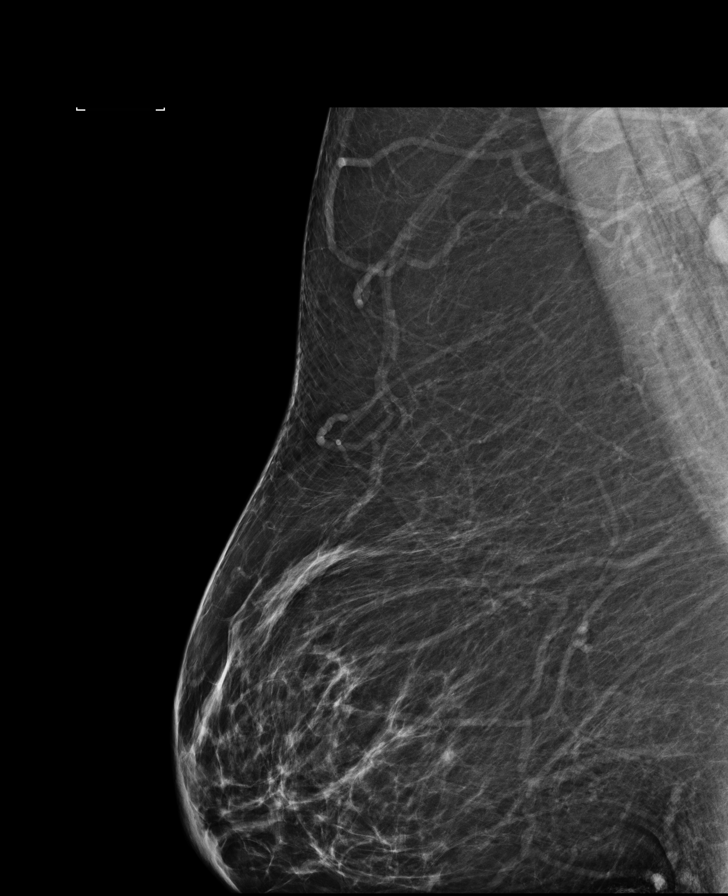

[L MLO (2 of 2)]
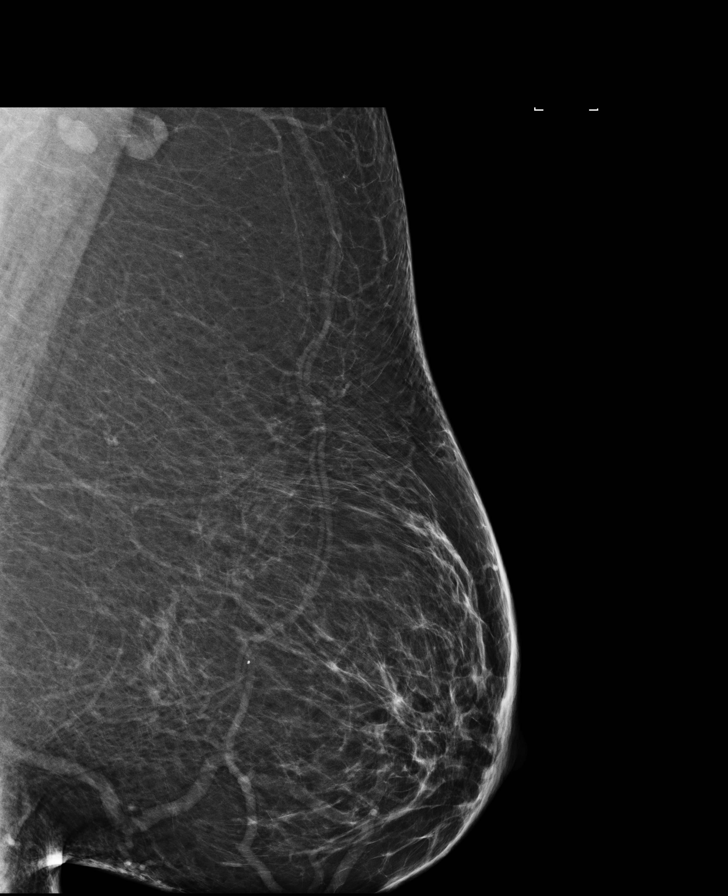

[R CC synth-2D]
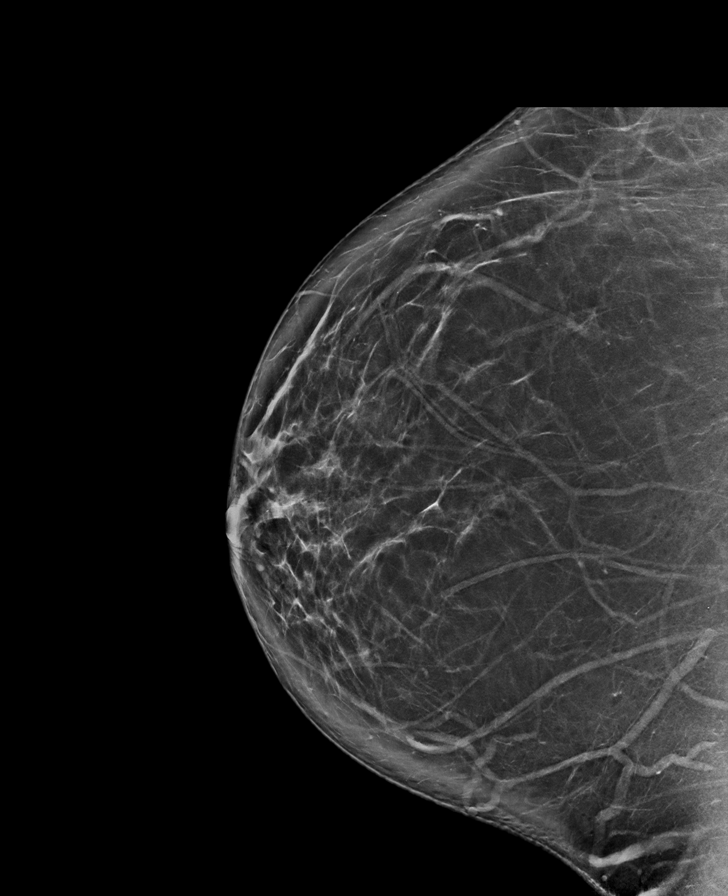

[R MLO (2 of 2)]
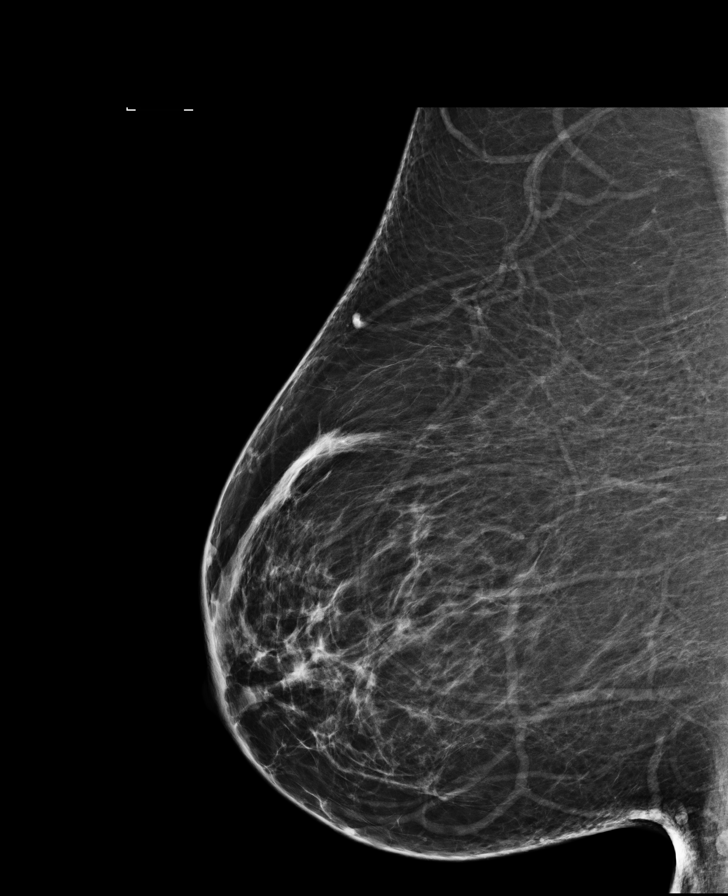

[R MLO synth-2D]
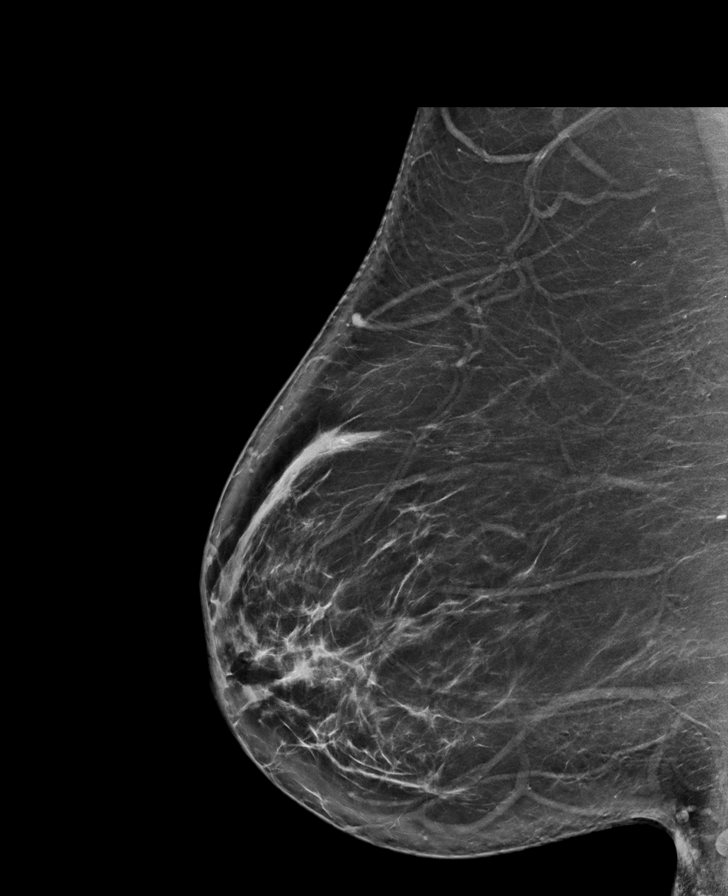

[8 of 32 positions shown; findings below may reference images not displayed]

ACR Breast Density Category b: There are scattered areas of
fibroglandular density.
FINDINGS: There are no findings suspicious for malignancy. Images were
processed with CAD.
IMPRESSION: No mammographic evidence of malignancy. A result letter of this
screening mammogram will be mailed directly to the patient.

RECOMMENDATION:
Screening mammogram in one year. (Code:[id number])

BI-RADS CATEGORY  1: Negative.

## 2018-09-22 ENCOUNTER — Other Ambulatory Visit: Payer: Self-pay | Admitting: Family Medicine

## 2018-09-23 NOTE — Telephone Encounter (Signed)
Last office visit 08/19/2018 for pansinusitis.  Last refilled 05/20/2018 for #60 with 3 refills.  Next Appt: 03/25/2019 for CPE.

## 2019-01-13 ENCOUNTER — Other Ambulatory Visit: Payer: Self-pay | Admitting: Family Medicine

## 2019-01-15 ENCOUNTER — Encounter: Payer: Self-pay | Admitting: Family Medicine

## 2019-01-15 DIAGNOSIS — M2550 Pain in unspecified joint: Secondary | ICD-10-CM

## 2019-01-16 ENCOUNTER — Other Ambulatory Visit: Payer: Self-pay | Admitting: *Deleted

## 2019-01-16 MED ORDER — HYDROCHLOROTHIAZIDE 12.5 MG PO TABS: 12.5000 mg | ORAL_TABLET | Freq: Every day | ORAL | 1 refills | Status: DC

## 2019-01-16 MED ORDER — LISINOPRIL 10 MG PO TABS: 10.0000 mg | ORAL_TABLET | Freq: Every day | ORAL | 1 refills | Status: DC

## 2019-01-16 NOTE — Telephone Encounter (Signed)
Received fax from [facility] that states combination pill on Lisinopril-HCTZ 10-12.5 mg is on backorder.  They are requesting separate prescriptions.  Separate prescriptions sent as requested.

## 2019-01-22 ENCOUNTER — Other Ambulatory Visit: Payer: Self-pay | Admitting: Family Medicine

## 2019-01-22 NOTE — Telephone Encounter (Signed)
Last office visit 08/19/2018 for sinusitis.  Last refilled 09/23/2018 for #60 with 3 refills.  No future appointments.

## 2019-03-21 ENCOUNTER — Encounter: Payer: Self-pay | Admitting: Family Medicine

## 2019-03-22 MED ORDER — FLUCONAZOLE 150 MG PO TABS: ORAL_TABLET | ORAL | 0 refills | Status: DC

## 2019-03-25 ENCOUNTER — Encounter: Admitting: Obstetrics and Gynecology

## 2019-04-14 ENCOUNTER — Other Ambulatory Visit: Payer: Self-pay | Admitting: Family Medicine

## 2019-04-14 DIAGNOSIS — Z131 Encounter for screening for diabetes mellitus: Secondary | ICD-10-CM

## 2019-04-14 DIAGNOSIS — Z0000 Encounter for general adult medical examination without abnormal findings: Secondary | ICD-10-CM

## 2019-04-17 ENCOUNTER — Other Ambulatory Visit

## 2019-04-18 ENCOUNTER — Other Ambulatory Visit

## 2019-04-22 ENCOUNTER — Encounter: Admitting: Obstetrics and Gynecology

## 2019-04-23 ENCOUNTER — Encounter: Admitting: Family Medicine

## 2019-07-07 ENCOUNTER — Other Ambulatory Visit (INDEPENDENT_AMBULATORY_CARE_PROVIDER_SITE_OTHER)

## 2019-07-07 DIAGNOSIS — Z131 Encounter for screening for diabetes mellitus: Secondary | ICD-10-CM

## 2019-07-07 DIAGNOSIS — Z0000 Encounter for general adult medical examination without abnormal findings: Secondary | ICD-10-CM

## 2019-07-07 LAB — BASIC METABOLIC PANEL
BUN: 26 mg/dL — ABNORMAL HIGH (ref 6–23)
CO2: 26 mEq/L (ref 19–32)
Calcium: 9.6 mg/dL (ref 8.4–10.5)
Chloride: 101 mEq/L (ref 96–112)
Creatinine, Ser: 0.91 mg/dL (ref 0.40–1.20)
GFR: 67.28 mL/min (ref >60.00)
Glucose, Bld: 91 mg/dL (ref 70–99)
Potassium: 5.2 mEq/L — ABNORMAL HIGH (ref 3.5–5.1)
Sodium: 136 mEq/L (ref 135–145)

## 2019-07-07 LAB — TSH: TSH: 1.45 u[IU]/mL (ref 0.35–4.50)

## 2019-07-07 LAB — CBC WITH DIFFERENTIAL/PLATELET
Basophils Absolute: 0.1 10*3/uL (ref 0.0–0.1)
Basophils Relative: 0.7 % (ref 0.0–3.0)
Eosinophils Absolute: 0.3 10*3/uL (ref 0.0–0.7)
Eosinophils Relative: 2.2 % (ref 0.0–5.0)
HCT: 39.7 % (ref 36.0–46.0)
Hemoglobin: 13.5 g/dL (ref 12.0–15.0)
Lymphocytes Relative: 33.4 % (ref 12.0–46.0)
Lymphs Abs: 4 10*3/uL (ref 0.7–4.0)
MCHC: 33.9 g/dL (ref 30.0–36.0)
MCV: 96.4 fl (ref 78.0–100.0)
Monocytes Absolute: 0.7 10*3/uL (ref 0.1–1.0)
Monocytes Relative: 6.2 % (ref 3.0–12.0)
Neutro Abs: 6.8 10*3/uL (ref 1.4–7.7)
Neutrophils Relative %: 57.5 % (ref 43.0–77.0)
Platelets: 309 10*3/uL (ref 150.0–400.0)
RBC: 4.12 Mil/uL (ref 3.87–5.11)
RDW: 12.8 % (ref 11.5–15.5)
WBC: 11.8 10*3/uL — ABNORMAL HIGH (ref 4.0–10.5)

## 2019-07-07 LAB — LIPID PANEL
Cholesterol: 225 mg/dL — ABNORMAL HIGH (ref 0–200)
HDL: 38.6 mg/dL — ABNORMAL LOW (ref >39.00)
NonHDL: 186.64
Total CHOL/HDL Ratio: 6
Triglycerides: 237 mg/dL — ABNORMAL HIGH (ref 0.0–149.0)
VLDL: 47.4 mg/dL — ABNORMAL HIGH (ref 0.0–40.0)

## 2019-07-07 LAB — HEMOGLOBIN A1C: Hgb A1c MFr Bld: 5.6 % (ref 4.6–6.5)

## 2019-07-07 LAB — LDL CHOLESTEROL, DIRECT: Direct LDL: 154 mg/dL

## 2019-07-14 ENCOUNTER — Ambulatory Visit (INDEPENDENT_AMBULATORY_CARE_PROVIDER_SITE_OTHER): Admitting: Family Medicine

## 2019-07-14 ENCOUNTER — Encounter: Payer: Self-pay | Admitting: Family Medicine

## 2019-07-14 ENCOUNTER — Other Ambulatory Visit: Payer: Self-pay

## 2019-07-14 VITALS — BP 138/72 | HR 92 | Temp 98.0°F | Ht 65.5 in | Wt 330.0 lb

## 2019-07-14 DIAGNOSIS — Z23 Encounter for immunization: Secondary | ICD-10-CM | POA: Diagnosis not present

## 2019-07-14 DIAGNOSIS — Z0000 Encounter for general adult medical examination without abnormal findings: Secondary | ICD-10-CM | POA: Diagnosis not present

## 2019-07-14 MED ORDER — LISINOPRIL-HYDROCHLOROTHIAZIDE 10-12.5 MG PO TABS: 1.0000 | ORAL_TABLET | Freq: Every day | ORAL | 3 refills | Status: DC

## 2019-07-14 NOTE — Progress Notes (Signed)
Ewen Varnell T. Dustine Stickler, MD Primary Care and Sports Medicine [facility] at [facility] [address] [place], [address] Phone: [phone]  FAX: [phone]  Andrea Hull - 44 y.o. female  MRN [id number]  Date of Birth: 05/02/1976  Visit Date: 07/14/2019  PCP: Hannah Beat, MD  Referred by: Hannah Beat, MD  Chief Complaint  Patient presents with  . Annual Exam    This visit occurred during the SARS-CoV-2 public health emergency.  Safety protocols were in place, including screening questions prior to the visit, additional usage of staff PPE, and extensive cleaning of exam room while observing appropriate contact time as indicated for disinfecting solutions.   Patient Care Team: Hannah Beat, MD as PCP - General (Family Medicine) Subjective:   Andrea Hull is a 44 y.o. pleasant patient who presents with the following:  Health Maintenance Summary Reviewed and updated, unless pt declines services.  Tobacco History Reviewed. + smoker, 1/2 pack a day, wants to sqitch to vuse now.  Not usin that much. Wants to quit. Alcohol: No concerns, no excessive use - was drunking a day, scotch and she stopped.  Exercise Habits: Some activity, rec at least 30 mins 5 times a week. Walking all the time. STD concerns: none Drug Use: None Birth control method: no Menses regular: yes and no Lumps or breast concerns: no Breast Cancer Family History: no  Down 33 pounds. Doing intermittent fasting.  Did visco Hip is doing better  Mammo - needs, will schedule  BP is ok.  Health Maintenance  Topic Date Due  . HIV Screening  11/26/1990  . TETANUS/TDAP  03/01/2026  . INFLUENZA VACCINE  Completed    Immunization History  Administered Date(s) Administered  . Influenza,inj,Quad PF,6+ Mos 04/14/2013, 03/27/2014, 04/07/2015, 03/01/2016, 03/15/2017, 03/20/2018, 07/14/2019  . Tdap 03/01/2016   Patient Active Problem List   Diagnosis Date Noted  .  Irregular periods/menstrual cycles 03/06/2017  . Dysmenorrhea 03/06/2017  . Galactorrhea 03/06/2017  . History of cesarean section 03/06/2017  . Morbid obesity with body mass index (BMI) of 50.0 to 59.9 in adult (HCC) 04/15/2013  . Sleep apnea, obstructive   . Tobacco user   . Essential hypertension   . Arthritis     Past Medical History:  Diagnosis Date  . Arthritis   . Heavy periods   . Hypertension   . Morbid obesity with BMI of 50.0-59.9, adult (HCC)   . Painful menstrual periods    h/o  . Sleep apnea, obstructive   . Tobacco abuse     Past Surgical History:  Procedure Laterality Date  . Breast Biospy  2013  . CESAREAN SECTION    . CHOLECYSTECTOMY N/A 11/17/2014   Procedure: LAPAROSCOPIC CHOLECYSTECTOMY WITH INTRAOPERATIVE CHOLANGIOGRAM;  Surgeon: Tiney Rouge III, MD;  Location: [facility] ORS;  Service: General;  Laterality: N/A;  . DILATION AND CURETTAGE OF UTERUS    . Miscarriage  2008    Past Medical History, Surgical History, Social History, Family History, Problem List, Medications, and Allergies have been reviewed and updated if relevant.   General: Denies fever, chills, sweats. No significant weight loss. Eyes: Denies blurring,significant itching ENT: Denies earache, sore throat, and hoarseness.  Cardiovascular: Denies chest pains, palpitations, dyspnea on exertion,  Respiratory: Denies cough, dyspnea at rest,wheeezing Breast: no concerns about lumps GI: Denies nausea, vomiting, diarrhea, constipation, change in bowel habits, abdominal pain, melena, hematochezia GU: Denies dysuria, hematuria, urinary hesitancy, nocturia, denies STD risk, no concerns about discharge Musculoskeletal: Denies  back pain, joint pain Derm: Denies rash, itching Neuro: Denies  paresthesias, frequent falls, frequent headaches Psych: Denies depression, anxiety Endocrine: Denies cold intolerance, heat intolerance, polydipsia Heme: Denies enlarged lymph nodes Allergy: No hayfever  Objective:    BP 138/72   Pulse 92   Temp 98 F (36.7 C) (Temporal)   Ht 5' 5.5" (1.664 m)   Wt (!) 330 lb (149.7 kg)   LMP 06/04/2019   SpO2 96%   BMI 54.08 kg/m  Ideal Body Weight: Weight in (lb) to have BMI = 25: 152.2 No exam data present Depression screen [facility] 2/9 07/14/2019 03/20/2018 03/15/2017  Decreased Interest 0 0 0  Down, Depressed, Hopeless 0 0 0  PHQ - 2 Score 0 0 0     GEN: well developed, well nourished, no acute distress Eyes: conjunctiva and lids normal, PERRLA, EOMI ENT: TM clear, nares clear, oral exam WNL Neck: supple, no lymphadenopathy, no thyromegaly, no JVD Pulm: clear to auscultation and percussion, respiratory effort normal CV: regular rate and rhythm, S1-S2, no murmur, rub or gallop, no bruits Chest: no scars, masses, no lumps BREAST: breast exam declined GI: soft, non-tender; no hepatosplenomegaly, masses; active bowel sounds all quadrants GU: GU exam declined Lymph: no cervical, axillary or inguinal adenopathy MSK: gait normal, muscle tone and strength WNL, no joint swelling, effusions, discoloration, crepitus  SKIN: clear, good turgor, color WNL, no rashes, lesions, or ulcerations Neuro: normal mental status, normal strength, sensation, and motion Psych: alert; oriented to person, place and time, normally interactive and not anxious or depressed in appearance.   All labs reviewed with patient. Results for orders placed or performed in visit on 07/07/19  TSH  Result Value Ref Range   TSH 1.45 0.35 - 4.50 uIU/mL  Basic metabolic panel  Result Value Ref Range   Sodium 136 135 - 145 mEq/L   Potassium 5.2 (H) 3.5 - 5.1 mEq/L   Chloride 101 96 - 112 mEq/L   CO2 26 19 - 32 mEq/L   Glucose, Bld 91 70 - 99 mg/dL   BUN 26 (H) 6 - 23 mg/dL   Creatinine, Ser 1.60 0.40 - 1.20 mg/dL   GFR 73.71 >06.26 mL/min   Calcium 9.6 8.4 - 10.5 mg/dL  CBC with Differential/Platelet  Result Value Ref Range   WBC 11.8 (H) 4.0 - 10.5 K/uL   RBC 4.12 3.87 - 5.11 Mil/uL    Hemoglobin 13.5 12.0 - 15.0 g/dL   HCT 94.8 54.6 - 27.0 %   MCV 96.4 78.0 - 100.0 fl   MCHC 33.9 30.0 - 36.0 g/dL   RDW 35.0 09.3 - 81.8 %   Platelets 309.0 150.0 - 400.0 K/uL   Neutrophils Relative % 57.5 43.0 - 77.0 %   Lymphocytes Relative 33.4 12.0 - 46.0 %   Monocytes Relative 6.2 3.0 - 12.0 %   Eosinophils Relative 2.2 0.0 - 5.0 %   Basophils Relative 0.7 0.0 - 3.0 %   Neutro Abs 6.8 1.4 - 7.7 K/uL   Lymphs Abs 4.0 0.7 - 4.0 K/uL   Monocytes Absolute 0.7 0.1 - 1.0 K/uL   Eosinophils Absolute 0.3 0.0 - 0.7 K/uL   Basophils Absolute 0.1 0.0 - 0.1 K/uL  Hemoglobin A1c  Result Value Ref Range   Hgb A1c MFr Bld 5.6 4.6 - 6.5 %  Lipid panel  Result Value Ref Range   Cholesterol 225 (H) 0 - 200 mg/dL   Triglycerides 299.3 (H) 0.0 - 149.0 mg/dL   HDL 71.69 (L) >67.89  mg/dL   VLDL 47.4 (H) 0.0 - 40.0 mg/dL   Total CHOL/HDL Ratio 6    NonHDL 186.64   LDL cholesterol, direct  Result Value Ref Range   Direct LDL 154.0 mg/dL   No results found.  Assessment and Plan:     ICD-10-CM   1. Healthcare maintenance  Z00.00   2. Need for influenza vaccination  Z23 Flu Vaccine QUAD 6+ mos PF IM (Fluarix Quad PF)   She is doing well, she has lost 30 pounds.  She is weaning off the nicotine.  I applauded her for her transitions and weight loss and succeeding in her goals of care.  At this point I would not do anything at all about her cholesterol, because it would expect that it will continue to improve as she loses weight.  Health Maintenance Exam: The patient's preventative maintenance and recommended screening tests for an annual wellness exam were reviewed in full today. Brought up to date unless services declined.  Counselled on the importance of diet, exercise, and its role in overall health and mortality. The patient's FH and SH was reviewed, including their home life, tobacco status, and drug and alcohol status.  Follow-up in 1 year for physical exam or additional follow-up  below.  Follow-up: No follow-ups on file. Or follow-up in 1 year if not noted.  No future appointments.  No orders of the defined types were placed in this encounter.  Medications Discontinued During This Encounter  Medication Reason  . azithromycin (ZITHROMAX) 250 MG tablet Completed Course  . fluconazole (DIFLUCAN) 150 MG tablet No longer needed (for PRN medications)  . hydrochlorothiazide (HYDRODIURIL) 56.8 MG tablet Duplicate  . lisinopril (ZESTRIL) 10 MG tablet Duplicate   Orders Placed This Encounter  Procedures  . Flu Vaccine QUAD 6+ mos PF IM (Fluarix Quad PF)    Signed,  Jidenna Figgs T. Jema Deegan, MD   Allergies as of 07/14/2019      Reactions   Levofloxacin Other (See Comments)   Joint pain      Medication List       Accurate as of July 14, 2019  2:18 PM. If you have any questions, ask your nurse or doctor.        STOP taking these medications   azithromycin 250 MG tablet Commonly known as: ZITHROMAX Stopped by: Owens Loffler, MD   fluconazole 150 MG tablet Commonly known as: DIFLUCAN Stopped by: Owens Loffler, MD     TAKE these medications   acetaminophen 500 MG tablet Commonly known as: TYLENOL Take 1,000 mg by mouth 2 (two) times daily.   diclofenac 75 MG EC tablet Commonly known as: VOLTAREN TAKE 1 TABLET(75 MG) BY MOUTH TWICE DAILY   famotidine 20 MG tablet Commonly known as: PEPCID Take 20 mg by mouth daily as needed.   lisinopril-hydrochlorothiazide 10-12.5 MG tablet Commonly known as: ZESTORETIC TAKE 1 TABLET BY MOUTH DAILY   MELATONIN PO Take 12 mg by mouth at bedtime.

## 2019-07-29 ENCOUNTER — Other Ambulatory Visit: Payer: Self-pay | Admitting: *Deleted

## 2019-07-29 MED ORDER — DICLOFENAC SODIUM 75 MG PO TBEC: DELAYED_RELEASE_TABLET | ORAL | 1 refills | Status: DC

## 2019-07-29 NOTE — Telephone Encounter (Signed)
Last office visit 07/14/2019 for CPE.  Last refilled 01/22/2019 for #180 with 1 refill.  No future appointments.

## 2019-11-08 ENCOUNTER — Encounter: Payer: Self-pay | Admitting: Family Medicine

## 2019-11-11 NOTE — Progress Notes (Signed)
Andrea Hull T. [name], MD, [place] at [facility] [place], [address]  Phone: [phone]  FAX: [place] - 44 y.o. female  MRN [id number]  Date of Birth: 08-02-1975  Date: 11/12/2019  PCP: Owens Loffler, MD  Referral: Owens Loffler, MD  Chief Complaint  Patient presents with  . Knot on Back of Left Knee  . Urinary Urgency    This visit occurred during the SARS-CoV-2 public health emergency.  Safety protocols were in place, including screening questions prior to the visit, additional usage of staff PPE, and extensive cleaning of exam room while observing appropriate contact time as indicated for disinfecting solutions.   Subjective:   Andrea Hull is a 44 y.o. very pleasant female patient with Body mass index is 50.35 kg/m. who presents with the following:  She presents for evaluation of a lump on her leg.  She has lost about 50 pounds, now she has noticed a bulging area in the posterior upper knee adjacent to the joint line.  This does not hurt.  She does have some arthritis, and she had some viscosupplementation last year.  She was concerned because she felt a bulge in the posterior aspect of her left knee.  She does have some known osteoarthritis.  It does not particularly hurt but she does feel it there.  She also has some urinary urgency.  Some burning after completion of urination.  L leg:    Wt Readings from Last 3 Encounters:  11/12/19 (!) 307 lb 4 oz (139.4 kg)  07/14/19 (!) 330 lb (149.7 kg)  08/19/18 (!) 346 lb 4 oz (157.1 kg)     Review of Systems is noted in the HPI, as appropriate  Objective:   BP 120/70   Pulse 86   Temp (!) 96.3 F (35.7 C) (Temporal)   Ht 5' 5.5" (1.664 m)   Wt (!) 307 lb 4 oz (139.4 kg)   SpO2 97%   BMI 50.35 kg/m   GEN: No acute distress; alert,appropriate. PULM: Breathing comfortably in no respiratory  distress PSYCH: Normally interactive.   Left knee: Full range of motion.  No effusion.  ACL, PCL, MCL and LCL are all intact.  Bounce home and flexion pinch causes no pain.  There is a Baker's cyst is palpable more medially and posterior.  Laboratory and Imaging Data: Results for orders placed or performed in visit on 11/12/19  POCT Urinalysis Dipstick (Automated)  Result Value Ref Range   Color, UA Yellow    Clarity, UA Cloudy    Glucose, UA Negative Negative   Bilirubin, UA 1+    Ketones, UA Trace    Spec Grav, UA >=1.030 (A) 1.010 - 1.025   Blood, UA Large    pH, UA 6.0 5.0 - 8.0   Protein, UA Positive (A) Negative   Urobilinogen, UA 0.2 0.2 or 1.0 E.U./dL   Nitrite, UA Positive    Leukocytes, UA Large (3+) (A) Negative     Assessment and Plan:     ICD-10-CM   1. Baker's cyst of knee, left  M71.22   2. Urgency of urination  R39.15 POCT Urinalysis Dipstick (Automated)    Urine Culture  3. Cystitis  N30.90    Medial Baker's cyst. There is an area just distal to this which is freely mobile and could represent a lipoma.  Urgency of urination, check for UTI. Almost certainly UTI  given UA.  Follow-up: No follow-ups on file.  Meds ordered this encounter  Medications  . sulfamethoxazole-trimethoprim (BACTRIM DS) 800-160 MG tablet    Sig: Take 1 tablet by mouth 2 (two) times daily for 7 days.    Dispense:  14 tablet    Refill:  0   There are no discontinued medications. Orders Placed This Encounter  Procedures  . Urine Culture  . POCT Urinalysis Dipstick (Automated)    Signed,  Pearl Berlinger T. Maleeka Sabatino, MD   Outpatient Encounter Medications as of 11/12/2019  Medication Sig  . acetaminophen (TYLENOL) 500 MG tablet Take 1,000 mg by mouth 2 (two) times daily.  . diclofenac (VOLTAREN) 75 MG EC tablet TAKE 1 TABLET(75 MG) BY MOUTH TWICE DAILY  . famotidine (PEPCID) 20 MG tablet Take 20 mg by mouth daily as needed.   Marland Kitchen lisinopril-hydrochlorothiazide (ZESTORETIC) 10-12.5 MG  tablet Take 1 tablet by mouth daily.  Marland Kitchen MELATONIN PO Take 12 mg by mouth at bedtime.   . sulfamethoxazole-trimethoprim (BACTRIM DS) 800-160 MG tablet Take 1 tablet by mouth 2 (two) times daily for 7 days.   No facility-administered encounter medications on file as of 11/12/2019.

## 2019-11-12 ENCOUNTER — Encounter: Payer: Self-pay | Admitting: Family Medicine

## 2019-11-12 ENCOUNTER — Ambulatory Visit (INDEPENDENT_AMBULATORY_CARE_PROVIDER_SITE_OTHER): Admitting: Family Medicine

## 2019-11-12 ENCOUNTER — Other Ambulatory Visit: Payer: Self-pay

## 2019-11-12 VITALS — BP 120/70 | HR 86 | Temp 96.3°F | Ht 65.5 in | Wt 307.2 lb

## 2019-11-12 DIAGNOSIS — R3915 Urgency of urination: Secondary | ICD-10-CM

## 2019-11-12 DIAGNOSIS — N3090 Cystitis, unspecified without hematuria: Secondary | ICD-10-CM | POA: Diagnosis not present

## 2019-11-12 DIAGNOSIS — M7122 Synovial cyst of popliteal space [Baker], left knee: Secondary | ICD-10-CM

## 2019-11-12 LAB — POC URINALSYSI DIPSTICK (AUTOMATED)
Glucose, UA: NEGATIVE
Nitrite, UA: POSITIVE
Protein, UA: POSITIVE — AB
Spec Grav, UA: >=1.03 — AB (ref 1.010–1.025)
Urobilinogen, UA: 0.2 E.U./dL
pH, UA: 6 (ref 5.0–8.0)

## 2019-11-12 MED ORDER — SULFAMETHOXAZOLE-TRIMETHOPRIM 800-160 MG PO TABS: 1.0000 | ORAL_TABLET | Freq: Two times a day (BID) | ORAL | 0 refills | Status: AC

## 2019-11-14 LAB — URINE CULTURE
MICRO NUMBER:: 10522554
SPECIMEN QUALITY:: ADEQUATE

## 2020-01-27 ENCOUNTER — Other Ambulatory Visit: Payer: Self-pay | Admitting: Family Medicine

## 2020-01-27 NOTE — Telephone Encounter (Signed)
Last office visit 11/12/2019 for baker's cyst of knee.  Last refilled 07/29/2019 for #180 with 1 refill.  No future appointments.

## 2020-03-26 ENCOUNTER — Ambulatory Visit
Admission: EM | Admit: 2020-03-26 | Discharge: 2020-03-26 | Disposition: A | Discharge status: Home / Self Care | Attending: Emergency Medicine | Admitting: Emergency Medicine

## 2020-03-26 DIAGNOSIS — J029 Acute pharyngitis, unspecified: Secondary | ICD-10-CM | POA: Diagnosis present

## 2020-03-26 LAB — POCT RAPID STREP A (OFFICE): Rapid Strep A Screen: NEGATIVE

## 2020-03-26 NOTE — ED Triage Notes (Signed)
Pt reports having a sore throat that began on Wednesday. Painful to swallow. sts she had a neg covid test today at [facility].

## 2020-03-26 NOTE — ED Provider Notes (Signed)
Andrea Hull    CSN: [id number] Arrival date & time: 03/26/20  1558      History   Chief Complaint Chief Complaint  Patient presents with  . Sore Throat    HPI Andrea Hull is a 44 y.o. female.   Patient presents with 2-day history of a sore throat.  She denies fever, chills, rash, ear pain, cough, shortness of breath, vomiting, diarrhea, or other symptoms.  Treatment attempted at home with Tylenol.  She had a negative rapid COVID test at [facility] today.  Her medical history includes morbid obesity, hypertension, tobacco abuse, sleep apnea, arthritis.  The history is provided by the patient.    Past Medical History:  Diagnosis Date  . Arthritis   . Heavy periods   . Hypertension   . Morbid obesity with BMI of 50.0-59.9, adult (HCC)   . Painful menstrual periods    h/o  . Sleep apnea, obstructive   . Tobacco abuse     Patient Active Problem List   Diagnosis Date Noted  . Irregular periods/menstrual cycles 03/06/2017  . Dysmenorrhea 03/06/2017  . Galactorrhea 03/06/2017  . History of cesarean section 03/06/2017  . Morbid obesity with body mass index (BMI) of 50.0 to 59.9 in adult (HCC) 04/15/2013  . Sleep apnea, obstructive   . Tobacco user   . Essential hypertension   . Arthritis     Past Surgical History:  Procedure Laterality Date  . Breast Biospy  2013  . CESAREAN SECTION    . CHOLECYSTECTOMY N/A 11/17/2014   Procedure: LAPAROSCOPIC CHOLECYSTECTOMY WITH INTRAOPERATIVE CHOLANGIOGRAM;  Surgeon: Tiney Rouge III, MD;  Location: [facility] ORS;  Service: General;  Laterality: N/A;  . DILATION AND CURETTAGE OF UTERUS    . Miscarriage  2008    OB History    Gravida  2   Para  1   Term  1   Preterm      AB  1   Living  1     SAB  1   TAB      Ectopic      Multiple      Live Births  1            Home Medications    Prior to Admission medications   Medication Sig Start Date End Date Taking? Authorizing Provider  acetaminophen (TYLENOL)  500 MG tablet Take 1,000 mg by mouth 2 (two) times daily.    [provider]  diclofenac (VOLTAREN) 75 MG EC tablet TAKE 1 TABLET(75 MG) BY MOUTH TWICE DAILY 01/27/20   Copland, Karleen Hampshire, MD  famotidine (PEPCID) 20 MG tablet Take 20 mg by mouth daily as needed.     [provider]  lisinopril-hydrochlorothiazide (ZESTORETIC) 10-12.5 MG tablet Take 1 tablet by mouth daily. 07/14/19   Copland, Karleen Hampshire, MD  MELATONIN PO Take 12 mg by mouth at bedtime.     [provider]  diclofenac (VOLTAREN) 75 MG EC tablet TAKE 1 TABLET(75 MG) BY MOUTH TWICE DAILY 07/29/19   Copland, Karleen Hampshire, MD    Family History Family History  Problem Relation Age of Onset  . Arthritis Mother   . Alcohol abuse Father   . Arthritis Father   . Hyperlipidemia Father   . Hypertension Father   . Diabetes Father   . Leukemia Father   . Personality disorder Sister   . Anxiety disorder Sister   . Diabetes Maternal Grandmother   . Lung cancer Maternal Grandfather   . Alcohol abuse Paternal  Grandmother   . Breast cancer Paternal Grandmother   . Alcohol abuse Paternal Grandfather   . Arthritis Paternal Grandfather   . Heart disease Paternal Grandfather   . Hypertension Sister   . Hypertension Brother   . Ovarian cancer Neg Hx   . Colon cancer Neg Hx     Social History Social History   Tobacco Use  . Smoking status: Former Smoker    Packs/day: 1.00    Years: 20.00    Pack years: 20.00    Types: Cigarettes    Quit date: 08/11/2019    Years since quitting: 0.6  . Smokeless tobacco: Never Used  Vaping Use  . Vaping Use: Some days  Substance Use Topics  . Alcohol use: Yes    Alcohol/week: 0.0 standard drinks    Comment: rare  . Drug use: No     Allergies   Levofloxacin   Review of Systems Review of Systems  Constitutional: Negative for chills and fever.  HENT: Positive for sore throat. Negative for ear pain.   Eyes: Negative for pain and visual disturbance.  Respiratory: Negative  for cough and shortness of breath.   Cardiovascular: Negative for chest pain and palpitations.  Gastrointestinal: Negative for abdominal pain, diarrhea and vomiting.  Genitourinary: Negative for dysuria and hematuria.  Musculoskeletal: Negative for arthralgias and back pain.  Skin: Negative for color change and rash.  Neurological: Negative for seizures and syncope.  All other systems reviewed and are negative.    Physical Exam Triage Vital Signs ED Triage Vitals  Enc Vitals Group     BP      Pulse      Resp      Temp      Temp src      SpO2      Weight      Height      Head Circumference      Peak Flow      Pain Score      Pain Loc      Pain Edu?      Excl. in GC?    No data found.  Updated Vital Signs BP (!) 141/78   Pulse 85   Temp 98.2 F (36.8 C) (Oral)   Resp 18   Ht 5\' 6"  (1.676 m)   Wt 297 lb (134.7 kg)   LMP 02/09/2020   SpO2 97%   BMI 47.94 kg/m   Visual Acuity Right Eye Distance:   Left Eye Distance:   Bilateral Distance:    Right Eye Near:   Left Eye Near:    Bilateral Near:     Physical Exam Vitals and nursing note reviewed.  Constitutional:      General: She is not in acute distress.    Appearance: She is well-developed.  HENT:     Head: Normocephalic and atraumatic.     Right Ear: Tympanic membrane normal.     Left Ear: Tympanic membrane normal.     Nose: Nose normal.     Mouth/Throat:     Mouth: Mucous membranes are moist.     Pharynx: Posterior oropharyngeal erythema present. No oropharyngeal exudate.     Tonsils: 0 on the right. 0 on the left.  Eyes:     Conjunctiva/sclera: Conjunctivae normal.  Cardiovascular:     Rate and Rhythm: Normal rate and regular rhythm.     Heart sounds: No murmur heard.   Pulmonary:     Effort: Pulmonary effort is normal. No respiratory distress.  Breath sounds: Normal breath sounds.  Abdominal:     Palpations: Abdomen is soft.     Tenderness: There is no abdominal tenderness.    Musculoskeletal:     Cervical back: Neck supple.  Skin:    General: Skin is warm and dry.     Findings: No rash.  Neurological:     General: No focal deficit present.     Mental Status: She is alert and oriented to person, place, and time.     Gait: Gait normal.  Psychiatric:        Mood and Affect: Mood normal.        Behavior: Behavior normal.      UC Treatments / Results  Labs (all labs ordered are listed, but only abnormal results are displayed) Labs Reviewed  CULTURE, GROUP A STREP ([facility])  NOVEL CORONAVIRUS, NAA  POCT RAPID STREP A (OFFICE)    EKG   Radiology No results found.  Procedures Procedures (including critical care time)  Medications Ordered in UC Medications - No data to display  Initial Impression / Assessment and Plan / UC Course  I have reviewed the triage vital signs and the nursing notes.  Pertinent labs & imaging results that were available during my care of the patient were reviewed by me and considered in my medical decision making (see chart for details).   Sore throat.  Rapid strep negative; culture pending.  PCR COVID pending.  Instructed patient to self quarantine until the test result is back.  Discussed symptomatic treatment including Tylenol, rest, hydration.  Instructed patient to go to the ED if she has acute worsening symptoms.  Patient agrees to plan of care.    Final Clinical Impressions(s) / UC Diagnoses   Final diagnoses:  Sore throat     Discharge Instructions     Your rapid strep test is negative.  A throat culture is pending; we will call you if it is positive requiring treatment.    Your COVID test is pending.  You should self quarantine until the test result is back.    Take Tylenol as needed for fever or discomfort.  Rest and keep yourself hydrated.    Go to the emergency department if you develop acute worsening symptoms.        ED Prescriptions    None     I have reviewed the PDMP during this  encounter.   Mickie Bail, NP 03/26/20 [phone]

## 2020-03-26 NOTE — Discharge Instructions (Addendum)
Your rapid strep test is negative.  A throat culture is pending; we will call you if it is positive requiring treatment.    Your COVID test is pending.  You should self quarantine until the test result is back.    Take Tylenol as needed for fever or discomfort.  Rest and keep yourself hydrated.    Go to the emergency department if you develop acute worsening symptoms.     

## 2020-03-28 LAB — NOVEL CORONAVIRUS, NAA: SARS-CoV-2, NAA: NOT DETECTED

## 2020-03-28 LAB — SARS-COV-2, NAA 2 DAY TAT

## 2020-03-29 LAB — CULTURE, GROUP A STREP (THRC)

## 2020-03-31 ENCOUNTER — Other Ambulatory Visit

## 2020-03-31 ENCOUNTER — Encounter: Payer: Self-pay | Admitting: Family Medicine

## 2020-03-31 DIAGNOSIS — Z20822 Contact with and (suspected) exposure to covid-19: Secondary | ICD-10-CM

## 2020-03-31 MED ORDER — AZITHROMYCIN 250 MG PO TABS: ORAL_TABLET | ORAL | 0 refills | Status: AC

## 2020-04-01 LAB — NOVEL CORONAVIRUS, NAA: SARS-CoV-2, NAA: NOT DETECTED

## 2020-04-01 LAB — SARS-COV-2, NAA 2 DAY TAT

## 2020-07-07 ENCOUNTER — Encounter: Payer: Self-pay | Admitting: Family Medicine

## 2020-07-07 DIAGNOSIS — U071 COVID-19: Secondary | ICD-10-CM

## 2020-07-07 NOTE — Telephone Encounter (Signed)
Morbid obesity, Temp to 102, HTN, no vaccine

## 2020-07-08 ENCOUNTER — Telehealth (HOSPITAL_COMMUNITY): Payer: Self-pay | Admitting: Family

## 2020-07-08 NOTE — Telephone Encounter (Signed)
Called to discuss with Andrea Hull about Covid symptoms and the use of casirivimab/imdevimab, a combination monoclonal antibody infusion for those with mild to moderate Covid symptoms and at a high risk of hospitalization.    Pt does not qualify for infusion therapy as her symptoms first presented > 7 days prior to timing of infusion. Current clinic protocol is < 7 days for infusion. Symptoms tier reviewed as well as criteria for ending isolation. Preventative practices reviewed. Patient verbalized understanding    Patient Active Problem List   Diagnosis Date Noted  . Irregular periods/menstrual cycles 03/06/2017  . Dysmenorrhea 03/06/2017  . Galactorrhea 03/06/2017  . History of cesarean section 03/06/2017  . Morbid obesity with body mass index (BMI) of 50.0 to 59.9 in adult (HCC) 04/15/2013  . Sleep apnea, obstructive   . Tobacco user   . Essential hypertension   . Arthritis     Emmajean Ratledge,NP

## 2020-07-17 ENCOUNTER — Other Ambulatory Visit: Payer: Self-pay | Admitting: Family Medicine

## 2020-07-19 NOTE — Telephone Encounter (Signed)
Please schedule CPE with fasting labs for Dr. Copland. 

## 2020-07-19 NOTE — Telephone Encounter (Signed)
Spoke with patient said will call back to schedule her appt.

## 2020-07-22 ENCOUNTER — Other Ambulatory Visit: Payer: Self-pay | Admitting: Family Medicine

## 2020-07-22 NOTE — Telephone Encounter (Signed)
Last office visit 11/12/2019 for baker's cyst of left knee.  Last refilled 08/102021 for #180 with 1 refill.  CPE scheduled 08/12/2020.

## 2020-08-02 ENCOUNTER — Other Ambulatory Visit: Payer: Self-pay | Admitting: Family Medicine

## 2020-08-02 DIAGNOSIS — Z131 Encounter for screening for diabetes mellitus: Secondary | ICD-10-CM

## 2020-08-02 DIAGNOSIS — E785 Hyperlipidemia, unspecified: Secondary | ICD-10-CM

## 2020-08-02 DIAGNOSIS — Z79899 Other long term (current) drug therapy: Secondary | ICD-10-CM

## 2020-08-05 ENCOUNTER — Other Ambulatory Visit: Payer: Self-pay

## 2020-08-05 ENCOUNTER — Other Ambulatory Visit (INDEPENDENT_AMBULATORY_CARE_PROVIDER_SITE_OTHER)

## 2020-08-05 ENCOUNTER — Other Ambulatory Visit

## 2020-08-05 DIAGNOSIS — E785 Hyperlipidemia, unspecified: Secondary | ICD-10-CM

## 2020-08-05 DIAGNOSIS — Z131 Encounter for screening for diabetes mellitus: Secondary | ICD-10-CM | POA: Diagnosis not present

## 2020-08-05 DIAGNOSIS — Z79899 Other long term (current) drug therapy: Secondary | ICD-10-CM

## 2020-08-05 LAB — BASIC METABOLIC PANEL
BUN: 22 mg/dL (ref 6–23)
CO2: 30 mEq/L (ref 19–32)
Calcium: 9.8 mg/dL (ref 8.4–10.5)
Chloride: 97 mEq/L (ref 96–112)
Creatinine, Ser: 0.9 mg/dL (ref 0.40–1.20)
GFR: 77.65 mL/min (ref >60.00)
Glucose, Bld: 87 mg/dL (ref 70–99)
Potassium: 4.4 mEq/L (ref 3.5–5.1)
Sodium: 132 mEq/L — ABNORMAL LOW (ref 135–145)

## 2020-08-05 LAB — CBC WITH DIFFERENTIAL/PLATELET
Basophils Absolute: 0.1 10*3/uL (ref 0.0–0.1)
Basophils Relative: 0.8 % (ref 0.0–3.0)
Eosinophils Absolute: 0.4 10*3/uL (ref 0.0–0.7)
Eosinophils Relative: 3 % (ref 0.0–5.0)
HCT: 42.2 % (ref 36.0–46.0)
Hemoglobin: 14.3 g/dL (ref 12.0–15.0)
Lymphocytes Relative: 33 % (ref 12.0–46.0)
Lymphs Abs: 3.9 10*3/uL (ref 0.7–4.0)
MCHC: 33.8 g/dL (ref 30.0–36.0)
MCV: 95.3 fl (ref 78.0–100.0)
Monocytes Absolute: 0.8 10*3/uL (ref 0.1–1.0)
Monocytes Relative: 6.9 % (ref 3.0–12.0)
Neutro Abs: 6.7 10*3/uL (ref 1.4–7.7)
Neutrophils Relative %: 56.3 % (ref 43.0–77.0)
Platelets: 322 10*3/uL (ref 150.0–400.0)
RBC: 4.43 Mil/uL (ref 3.87–5.11)
RDW: 13.1 % (ref 11.5–15.5)
WBC: 11.8 10*3/uL — ABNORMAL HIGH (ref 4.0–10.5)

## 2020-08-05 LAB — LIPID PANEL
Cholesterol: 264 mg/dL — ABNORMAL HIGH (ref 0–200)
HDL: 39.4 mg/dL (ref >39.00)
NonHDL: 224.29
Total CHOL/HDL Ratio: 7
Triglycerides: 208 mg/dL — ABNORMAL HIGH (ref 0.0–149.0)
VLDL: 41.6 mg/dL — ABNORMAL HIGH (ref 0.0–40.0)

## 2020-08-05 LAB — HEPATIC FUNCTION PANEL
ALT: 15 U/L (ref 0–35)
AST: 13 U/L (ref 0–37)
Albumin: 4.4 g/dL (ref 3.5–5.2)
Alkaline Phosphatase: 65 U/L (ref 39–117)
Bilirubin, Direct: 0.1 mg/dL (ref 0.0–0.3)
Total Bilirubin: 0.6 mg/dL (ref 0.2–1.2)
Total Protein: 7.5 g/dL (ref 6.0–8.3)

## 2020-08-05 LAB — LDL CHOLESTEROL, DIRECT: Direct LDL: 183 mg/dL

## 2020-08-05 LAB — TSH: TSH: 0.88 u[IU]/mL (ref 0.35–4.50)

## 2020-08-05 LAB — HEMOGLOBIN A1C: Hgb A1c MFr Bld: 5.3 % (ref 4.6–6.5)

## 2020-08-12 ENCOUNTER — Other Ambulatory Visit: Payer: Self-pay

## 2020-08-12 ENCOUNTER — Ambulatory Visit (INDEPENDENT_AMBULATORY_CARE_PROVIDER_SITE_OTHER): Admitting: Family Medicine

## 2020-08-12 ENCOUNTER — Encounter: Payer: Self-pay | Admitting: Family Medicine

## 2020-08-12 VITALS — BP 110/70 | HR 84 | Temp 97.6°F | Ht 65.5 in | Wt 307.2 lb

## 2020-08-12 DIAGNOSIS — Z0000 Encounter for general adult medical examination without abnormal findings: Secondary | ICD-10-CM

## 2020-08-12 DIAGNOSIS — Z23 Encounter for immunization: Secondary | ICD-10-CM

## 2020-08-12 NOTE — Progress Notes (Signed)
Spencer T. Copland, MD, CAQ Sports Medicine  Primary Care and Sports Medicine [facility] [address] [place], [address]  Phone: [phone]  FAX: [phone]  Andrea Hull - 45 y.o. female  MRN [id number]  Date of Birth: 1975-12-22  Date: 08/12/2020  PCP: Hannah Beat, MD  Referral: Hannah Beat, MD  Chief Complaint  Patient presents with  . Annual Exam    This visit occurred during the SARS-CoV-2 public health emergency.  Safety protocols were in place, including screening questions prior to the visit, additional usage of staff PPE, and extensive cleaning of exam room while observing appropriate contact time as indicated for disinfecting solutions.   Patient Care Team: Hannah Beat, MD as PCP - General (Family Medicine) Subjective:   Andrea Hull is a 45 y.o. pleasant patient who presents with the following:  Health Maintenance Summary Reviewed and updated, unless pt declines services.  Tobacco History Reviewed. Smoking, 3 pods a week.  3 a week. 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 Hepatitis C Screening  Never done  . COVID-19 Vaccine (1) Never  done  . HIV Screening  Never done  . TETANUS/TDAP  03/01/2026  . INFLUENZA VACCINE  Completed    Immunization History  Administered Date(s) Administered  . Influenza,inj,Quad PF,6+ Mos 04/14/2013, 03/27/2014, 04/07/2015, 03/01/2016, 03/15/2017, 03/20/2018, 07/14/2019, 08/12/2020  . Tdap 03/01/2016   Patient Active Problem List   Diagnosis Date Noted  . Dysmenorrhea 03/06/2017  . Morbid obesity with body mass index (BMI) of 50.0 to 59.9 in adult (HCC) 04/15/2013  . Sleep apnea, obstructive   . Tobacco user   . Essential hypertension     Past Medical History:  Diagnosis Date  . Hypertension   . Morbid obesity with BMI of 50.0-59.9, adult (HCC)   . Sleep apnea, obstructive   . Tobacco abuse     Past Surgical History:  Procedure Laterality Date  . Breast Biospy  2013  . CESAREAN SECTION    . CHOLECYSTECTOMY N/A 11/17/2014   Procedure: LAPAROSCOPIC CHOLECYSTECTOMY WITH INTRAOPERATIVE CHOLANGIOGRAM;  Surgeon: Tiney Rouge III, MD;  Location: [facility] ORS;  Service: General;  Laterality: N/A;  . DILATION AND CURETTAGE OF UTERUS    . Miscarriage  2008    Family History  Problem Relation Age of Onset  . Arthritis Mother   . Alcohol abuse Father   . Arthritis Father   . Hyperlipidemia Father   .  Hypertension Father   . Diabetes Father   . Leukemia Father   . Personality disorder Sister   . Anxiety disorder Sister   . Diabetes Maternal Grandmother   . Lung cancer Maternal Grandfather   . Alcohol abuse Paternal Grandmother   . Breast cancer Paternal Grandmother   . Alcohol abuse Paternal Grandfather   . Arthritis Paternal Grandfather   . Heart disease Paternal Grandfather   . Hypertension Sister   . Hypertension Brother   . Ovarian cancer Neg Hx   . Colon cancer Neg Hx     Past Medical History, Surgical History, Social History, Family History, Problem List, Medications, and Allergies have been reviewed and updated if relevant.  Review of Systems: Pertinent positives are  listed above.  Otherwise, a full 14 point review of systems has been done in full and it is negative except where it is noted positive.  Objective:   BP 110/70   Pulse 84   Temp 97.6 F (36.4 C) (Temporal)   Ht 5' 5.5" (1.664 m)   Wt (!) 307 lb 4 oz (139.4 kg)   SpO2 96%   BMI 50.35 kg/m  Ideal Body Weight: Weight in (lb) to have BMI = 25: 152.2 No exam data present Depression screen [facility] 2/9 08/12/2020 07/14/2019 03/20/2018 03/15/2017  Decreased Interest 0 0 0 0  Down, Depressed, Hopeless 0 0 0 0  PHQ - 2 Score 0 0 0 0     GEN: well developed, well nourished, no acute distress Eyes: conjunctiva and lids normal, PERRLA, EOMI ENT: TM clear, nares clear, oral exam WNL Neck: supple, no lymphadenopathy, no thyromegaly, no JVD Pulm: clear to auscultation and percussion, respiratory effort normal CV: regular rate and rhythm, S1-S2, no murmur, rub or gallop, no bruits Chest: no scars, masses, no lumps BREAST: breast exam declined GI: soft, non-tender; no hepatosplenomegaly, masses; active bowel sounds all quadrants GU: GU exam declined Lymph: no cervical, axillary or inguinal adenopathy MSK: gait normal, muscle tone and strength WNL, no joint swelling, effusions, discoloration, crepitus  SKIN: clear, good turgor, color WNL, no rashes, lesions, or ulcerations Neuro: normal mental status, normal strength, sensation, and motion Psych: alert; oriented to person, place and time, normally interactive and not anxious or depressed in appearance.   All labs reviewed with patient. Results for orders placed or performed in visit on 08/05/20  Hemoglobin A1c  Result Value Ref Range   Hgb A1c MFr Bld 5.3 4.6 - 6.5 %  TSH  Result Value Ref Range   TSH 0.88 0.35 - 4.50 uIU/mL  Hepatic function panel  Result Value Ref Range   Total Bilirubin 0.6 0.2 - 1.2 mg/dL   Bilirubin, Direct 0.1 0.0 - 0.3 mg/dL   Alkaline Phosphatase 65 39 - 117 U/L   AST 13 0 - 37 U/L   ALT 15 0 - 35 U/L   Total  Protein 7.5 6.0 - 8.3 g/dL   Albumin 4.4 3.5 - 5.2 g/dL  CBC with Differential/Platelet  Result Value Ref Range   WBC 11.8 (H) 4.0 - 10.5 K/uL   RBC 4.43 3.87 - 5.11 Mil/uL   Hemoglobin 14.3 12.0 - 15.0 g/dL   HCT 52.7 78.2 - 42.3 %   MCV 95.3 78.0 - 100.0 fl   MCHC 33.8 30.0 - 36.0 g/dL   RDW 53.6 14.4 - 31.5 %   Platelets 322.0 150.0 - 400.0 K/uL   Neutrophils Relative % 56.3 43.0 - 77.0 %   Lymphocytes Relative 33.0 12.0 -  46.0 %   Monocytes Relative 6.9 3.0 - 12.0 %   Eosinophils Relative 3.0 0.0 - 5.0 %   Basophils Relative 0.8 0.0 - 3.0 %   Neutro Abs 6.7 1.4 - 7.7 K/uL   Lymphs Abs 3.9 0.7 - 4.0 K/uL   Monocytes Absolute 0.8 0.1 - 1.0 K/uL   Eosinophils Absolute 0.4 0.0 - 0.7 K/uL   Basophils Absolute 0.1 0.0 - 0.1 K/uL  Basic metabolic panel  Result Value Ref Range   Sodium 132 (L) 135 - 145 mEq/L   Potassium 4.4 3.5 - 5.1 mEq/L   Chloride 97 96 - 112 mEq/L   CO2 30 19 - 32 mEq/L   Glucose, Bld 87 70 - 99 mg/dL   BUN 22 6 - 23 mg/dL   Creatinine, Ser 4.090.90 0.40 - 1.20 mg/dL   GFR 81.1977.65 >14.78>60.00 mL/min   Calcium 9.8 8.4 - 10.5 mg/dL  Lipid panel  Result Value Ref Range   Cholesterol 264 (H) 0 - 200 mg/dL   Triglycerides 295.6208.0 (H) 0.0 - 149.0 mg/dL   HDL 21.3039.40 >86.57>39.00 mg/dL   VLDL 84.641.6 (H) 0.0 - 96.240.0 mg/dL   Total CHOL/HDL Ratio 7    NonHDL 224.29   LDL cholesterol, direct  Result Value Ref Range   Direct LDL 183.0 mg/dL   No results found.  Assessment and Plan:     ICD-10-CM   1. Healthcare maintenance  Z00.00   2. Need for influenza vaccination  Z23 Flu Vaccine QUAD 6+ mos PF IM (Fluarix Quad PF)   She is going to work on losing weight and exercise as her primary goal.  She had gained 30 pounds, but this is dropped down to approaching her baseline.  She is also dental work on stopping her nicotine pods.  Health Maintenance Exam: The patient's preventative maintenance and recommended screening tests for an annual wellness exam were reviewed in full  today. Brought up to date unless services declined.  Counselled on the importance of diet, exercise, and its role in overall health and mortality. The patient's FH and SH was reviewed, including their home life, tobacco status, and drug and alcohol status.  Follow-up in 1 year for physical exam or additional follow-up below.  Follow-up: Return in about 6 months (around 02/09/2021) for cholesterol recheck. Or follow-up in 1 year if not noted.  No future appointments.  No orders of the defined types were placed in this encounter.  Medications Discontinued During This Encounter  Medication Reason  . famotidine (PEPCID) 20 MG tablet Completed Course  . acetaminophen (TYLENOL) 500 MG tablet Change in therapy   Orders Placed This Encounter  Procedures  . Flu Vaccine QUAD 6+ mos PF IM (Fluarix Quad PF)    Signed,  Spencer T. Copland, MD   Allergies as of 08/12/2020      Reactions   Levofloxacin Other (See Comments)   Joint pain      Medication List       Accurate as of August 12, 2020 11:32 AM. If you have any questions, ask your nurse or doctor.        STOP taking these medications   famotidine 20 MG tablet Commonly known as: PEPCID Stopped by: Hannah BeatSpencer Copland, MD     TAKE these medications   acetaminophen 650 MG CR tablet Commonly known as: TYLENOL Take 1,300 mg by mouth in the morning, at noon, and at bedtime. What changed: Another medication with the same name was removed. Continue taking this medication, and  follow the directions you see here. Changed by: Hannah Beat, MD   diclofenac 75 MG EC tablet Commonly known as: VOLTAREN TAKE 1 TABLET(75 MG) BY MOUTH TWICE DAILY   lisinopril-hydrochlorothiazide 10-12.5 MG tablet Commonly known as: ZESTORETIC TAKE 1 TABLET BY MOUTH DAILY   MELATONIN PO Take 12 mg by mouth at bedtime.

## 2020-09-15 ENCOUNTER — Other Ambulatory Visit: Payer: Self-pay | Admitting: Orthopedic Surgery

## 2020-09-15 DIAGNOSIS — M1611 Unilateral primary osteoarthritis, right hip: Secondary | ICD-10-CM

## 2020-09-29 ENCOUNTER — Other Ambulatory Visit: Payer: Self-pay

## 2020-09-29 ENCOUNTER — Ambulatory Visit
Admission: RE | Admit: 2020-09-29 | Discharge: 2020-09-29 | Disposition: A | Discharge status: Home / Self Care | Source: Ambulatory Visit | Attending: Orthopedic Surgery | Admitting: Orthopedic Surgery

## 2020-09-29 DIAGNOSIS — M1611 Unilateral primary osteoarthritis, right hip: Secondary | ICD-10-CM | POA: Insufficient documentation

## 2020-10-11 ENCOUNTER — Other Ambulatory Visit: Payer: Self-pay | Admitting: Orthopedic Surgery

## 2020-10-25 ENCOUNTER — Encounter
Admission: RE | Admit: 2020-10-25 | Discharge: 2020-10-25 | Disposition: A | Discharge status: Home / Self Care | Source: Ambulatory Visit | Attending: Orthopedic Surgery | Admitting: Orthopedic Surgery

## 2020-10-25 ENCOUNTER — Other Ambulatory Visit: Payer: Self-pay

## 2020-10-25 DIAGNOSIS — Z01818 Encounter for other preprocedural examination: Secondary | ICD-10-CM | POA: Diagnosis present

## 2020-10-25 HISTORY — DX: Gastro-esophageal reflux disease without esophagitis: K21.9

## 2020-10-25 LAB — URINALYSIS, ROUTINE W REFLEX MICROSCOPIC
Bilirubin Urine: NEGATIVE
Glucose, UA: NEGATIVE mg/dL
Ketones, ur: NEGATIVE mg/dL
Leukocytes,Ua: NEGATIVE
Nitrite: NEGATIVE
Protein, ur: NEGATIVE mg/dL
Specific Gravity, Urine: 1.029 (ref 1.005–1.030)
pH: 5 (ref 5.0–8.0)

## 2020-10-25 LAB — CBC WITH DIFFERENTIAL/PLATELET
Abs Immature Granulocytes: 0.05 10*3/uL (ref 0.00–0.07)
Basophils Absolute: 0.1 10*3/uL (ref 0.0–0.1)
Basophils Relative: 1 %
Eosinophils Absolute: 0.3 10*3/uL (ref 0.0–0.5)
Eosinophils Relative: 2 %
HCT: 39.2 % (ref 36.0–46.0)
Hemoglobin: 13.3 g/dL (ref 12.0–15.0)
Immature Granulocytes: 0 %
Lymphocytes Relative: 35 %
Lymphs Abs: 4.2 10*3/uL — ABNORMAL HIGH (ref 0.7–4.0)
MCH: 32.3 pg (ref 26.0–34.0)
MCHC: 33.9 g/dL (ref 30.0–36.0)
MCV: 95.1 fL (ref 80.0–100.0)
Monocytes Absolute: 0.8 10*3/uL (ref 0.1–1.0)
Monocytes Relative: 6 %
Neutro Abs: 6.6 10*3/uL (ref 1.7–7.7)
Neutrophils Relative %: 56 %
Platelets: 289 10*3/uL (ref 150–400)
RBC: 4.12 MIL/uL (ref 3.87–5.11)
RDW: 12.5 % (ref 11.5–15.5)
WBC: 11.9 10*3/uL — ABNORMAL HIGH (ref 4.0–10.5)
nRBC: 0 % (ref 0.0–0.2)

## 2020-10-25 LAB — COMPREHENSIVE METABOLIC PANEL
ALT: 16 U/L (ref 0–44)
AST: 14 U/L — ABNORMAL LOW (ref 15–41)
Albumin: 4.1 g/dL (ref 3.5–5.0)
Alkaline Phosphatase: 50 U/L (ref 38–126)
Anion gap: 7 (ref 5–15)
BUN: 24 mg/dL — ABNORMAL HIGH (ref 6–20)
CO2: 27 mmol/L (ref 22–32)
Calcium: 9.4 mg/dL (ref 8.9–10.3)
Chloride: 102 mmol/L (ref 98–111)
Creatinine, Ser: 0.92 mg/dL (ref 0.44–1.00)
GFR, Estimated: >60 mL/min (ref >60)
Glucose, Bld: 100 mg/dL — ABNORMAL HIGH (ref 70–99)
Potassium: 4.2 mmol/L (ref 3.5–5.1)
Sodium: 136 mmol/L (ref 135–145)
Total Bilirubin: 0.7 mg/dL (ref 0.3–1.2)
Total Protein: 7.4 g/dL (ref 6.5–8.1)

## 2020-10-25 LAB — SURGICAL PCR SCREEN
MRSA, PCR: NEGATIVE
Staphylococcus aureus: NEGATIVE

## 2020-10-25 LAB — TYPE AND SCREEN
ABO/RH(D): O POS
Antibody Screen: NEGATIVE

## 2020-10-25 NOTE — Patient Instructions (Addendum)
Your procedure is scheduled on:11-04-20 THURSDAY Report to the Registration Desk on the 1st floor of the [facility]-Then proceed to the 2nd floor Surgery Desk in the Medical [facility] To find out your arrival time, please call [phone] between 1PM - 3PM on:11-03-20 WEDNESDAY  REMEMBER: Instructions that are not followed completely may result in serious medical risk, up to and including death; or upon the discretion of your surgeon and anesthesiologist your surgery may need to be rescheduled.  Do not eat food after midnight the night before surgery.  No gum chewing, lozengers or hard candies.  You may however, drink CLEAR liquids up to 2 hours before you are scheduled to arrive for your surgery. Do not drink anything within 2 hours of your scheduled arrival time.  Clear liquids include: - water  - apple juice without pulp - gatorade - black coffee or tea (Do NOT add milk or creamers to the coffee or tea) Do NOT drink anything that is not on this list.  In addition, your doctor has ordered for you to drink the provided  Ensure Pre-Surgery Clear Carbohydrate Drink  Drinking this carbohydrate drink up to two hours before surgery helps to reduce insulin resistance and improve patient outcomes. Please complete drinking 2 hours prior to scheduled arrival time.  DO NOT TAKE ANY MEDICATION THE DAY OF SURGERY  One week prior to surgery: Stop Anti-inflammatories (NSAIDS) such as DICLOFENAC (VOLTAREN), Advil, Aleve, Ibuprofen, Motrin, Naproxen, Naprosyn and Aspirin based products such as Excedrin, Goodys Powder, BC Powder-OK TO TAKE TYLENOL IF NEEDED  Stop ANY OVER THE COUNTER supplements/vitamins until after surgery-LAST DOSE OF MELATONIN WILL BE ON 10-27-20 [facility]  No Alcohol for 24 hours before or after surgery.  No Smoking including e-cigarettes for 24 hours prior to surgery.  No chewable tobacco products for at least 6 hours prior to surgery.  No nicotine patches on the day of  surgery.  Do not use any "recreational" drugs for at least a week prior to your surgery.  Please be advised that the combination of cocaine and anesthesia may have negative outcomes, up to and including death. If you test positive for cocaine, your surgery will be cancelled.  On the morning of surgery brush your teeth with toothpaste and water, you may rinse your mouth with mouthwash if you wish. Do not swallow any toothpaste or mouthwash.  Do not wear jewelry, make-up, hairpins, clips or nail polish.  Do not wear lotions, powders, or perfumes.   Do not shave body from the neck down 48 hours prior to surgery just in case you cut yourself which could leave a site for infection.  Also, freshly shaved skin may become irritated if using the CHG soap.  Contact lenses, hearing aids and dentures may not be worn into surgery.  Do not bring valuables to the hospital. [facility] is not responsible for any missing/lost belongings or valuables.   Use CHG Soap as directed on instruction sheet.  Bring your C-PAP to the hospital with you   Notify your doctor if there is any change in your medical condition (cold, fever, infection).  Wear comfortable clothing (specific to your surgery type) to the hospital.  Plan for stool softeners for home use; pain medications have a tendency to cause constipation. You can also help prevent constipation by eating foods high in fiber such as fruits and vegetables and drinking plenty of fluids as your diet allows.  After surgery, you can help prevent lung complications by doing breathing exercises.  Take deep breaths and cough every 1-2 hours. Your doctor may order a device called an Incentive Spirometer to help you take deep breaths. When coughing or sneezing, hold a pillow firmly against your incision with both hands. This is called "splinting." Doing this helps protect your incision. It also decreases belly discomfort.  If you are being admitted to the  hospital overnight, leave your suitcase in the car. After surgery it may be brought to your room.  If you are being discharged the day of surgery, you will not be allowed to drive home. You will need a responsible adult (18 years or older) to drive you home and stay with you that night.   If you are taking public transportation, you will need to have a responsible adult (18 years or older) with you. Please confirm with your physician that it is acceptable to use public transportation.   Please call the Pre-admissions Testing Dept. at [phone] if you have any questions about these instructions.  Surgery Visitation Policy:  Patients undergoing a surgery or procedure may have one family member or support person with them as long as that person is not COVID-19 positive or experiencing its symptoms.  That person may remain in the waiting area during the procedure.  Inpatient Visitation:    Visiting hours are 7 a.m. to 8 p.m. Inpatients will be allowed two visitors daily. The visitors may change each day during the patient's stay. No visitors under the age of 95. Any visitor under the age of 50 must be accompanied by an adult. The visitor must pass COVID-19 screenings, use hand sanitizer when entering and exiting the patient's room and wear a mask at all times, including in the patient's room. Patients must also wear a mask when staff or their visitor are in the room. Masking is required regardless of vaccination status.

## 2020-10-30 ENCOUNTER — Other Ambulatory Visit: Payer: Self-pay | Admitting: Family Medicine

## 2020-11-02 ENCOUNTER — Other Ambulatory Visit: Payer: Self-pay

## 2020-11-02 ENCOUNTER — Other Ambulatory Visit
Admission: RE | Admit: 2020-11-02 | Discharge: 2020-11-02 | Disposition: A | Discharge status: Home / Self Care | Source: Ambulatory Visit | Attending: Orthopedic Surgery | Admitting: Orthopedic Surgery

## 2020-11-02 DIAGNOSIS — Z01812 Encounter for preprocedural laboratory examination: Secondary | ICD-10-CM | POA: Insufficient documentation

## 2020-11-02 DIAGNOSIS — Z20822 Contact with and (suspected) exposure to covid-19: Secondary | ICD-10-CM | POA: Insufficient documentation

## 2020-11-02 LAB — SARS CORONAVIRUS 2 (TAT 6-24 HRS): SARS Coronavirus 2: NEGATIVE

## 2020-11-04 ENCOUNTER — Inpatient Hospital Stay

## 2020-11-04 ENCOUNTER — Inpatient Hospital Stay: Admitting: Certified Registered"

## 2020-11-04 ENCOUNTER — Inpatient Hospital Stay
Admission: RE | Admit: 2020-11-04 | Discharge: 2020-11-09 | DRG: 470 | Disposition: A | Discharge status: Home Health | Attending: Orthopedic Surgery | Admitting: Orthopedic Surgery

## 2020-11-04 ENCOUNTER — Encounter
Admission: RE | Disposition: A | Discharge status: Home Health | Payer: Self-pay | Source: Home / Self Care | Attending: Orthopedic Surgery

## 2020-11-04 ENCOUNTER — Encounter: Payer: Self-pay | Admitting: Orthopedic Surgery

## 2020-11-04 ENCOUNTER — Other Ambulatory Visit: Payer: Self-pay

## 2020-11-04 DIAGNOSIS — E7800 Pure hypercholesterolemia, unspecified: Secondary | ICD-10-CM | POA: Diagnosis present

## 2020-11-04 DIAGNOSIS — Z6841 Body Mass Index (BMI) 40.0 and over, adult: Secondary | ICD-10-CM

## 2020-11-04 DIAGNOSIS — Z8249 Family history of ischemic heart disease and other diseases of the circulatory system: Secondary | ICD-10-CM | POA: Diagnosis not present

## 2020-11-04 DIAGNOSIS — Z79890 Hormone replacement therapy (postmenopausal): Secondary | ICD-10-CM | POA: Diagnosis not present

## 2020-11-04 DIAGNOSIS — M1611 Unilateral primary osteoarthritis, right hip: Secondary | ICD-10-CM | POA: Diagnosis present

## 2020-11-04 DIAGNOSIS — K219 Gastro-esophageal reflux disease without esophagitis: Secondary | ICD-10-CM | POA: Diagnosis present

## 2020-11-04 DIAGNOSIS — Z8342 Family history of familial hypercholesterolemia: Secondary | ICD-10-CM

## 2020-11-04 DIAGNOSIS — Z881 Allergy status to other antibiotic agents status: Secondary | ICD-10-CM

## 2020-11-04 DIAGNOSIS — G8918 Other acute postprocedural pain: Secondary | ICD-10-CM

## 2020-11-04 DIAGNOSIS — Z79899 Other long term (current) drug therapy: Secondary | ICD-10-CM

## 2020-11-04 DIAGNOSIS — Z20822 Contact with and (suspected) exposure to covid-19: Secondary | ICD-10-CM | POA: Diagnosis present

## 2020-11-04 DIAGNOSIS — I10 Essential (primary) hypertension: Secondary | ICD-10-CM | POA: Diagnosis present

## 2020-11-04 DIAGNOSIS — G4733 Obstructive sleep apnea (adult) (pediatric): Secondary | ICD-10-CM | POA: Diagnosis present

## 2020-11-04 DIAGNOSIS — Z8261 Family history of arthritis: Secondary | ICD-10-CM

## 2020-11-04 DIAGNOSIS — Z419 Encounter for procedure for purposes other than remedying health state, unspecified: Secondary | ICD-10-CM

## 2020-11-04 DIAGNOSIS — Z96649 Presence of unspecified artificial hip joint: Secondary | ICD-10-CM

## 2020-11-04 HISTORY — PX: TOTAL HIP ARTHROPLASTY: SHX124

## 2020-11-04 LAB — CBC
HCT: 40.6 % (ref 36.0–46.0)
Hemoglobin: 13.9 g/dL (ref 12.0–15.0)
MCH: 32.6 pg (ref 26.0–34.0)
MCHC: 34.2 g/dL (ref 30.0–36.0)
MCV: 95.1 fL (ref 80.0–100.0)
Platelets: 280 10*3/uL (ref 150–400)
RBC: 4.27 MIL/uL (ref 3.87–5.11)
RDW: 12.1 % (ref 11.5–15.5)
WBC: 25.4 10*3/uL — ABNORMAL HIGH (ref 4.0–10.5)
nRBC: 0 % (ref 0.0–0.2)

## 2020-11-04 LAB — CREATININE, SERUM
Creatinine, Ser: 0.93 mg/dL (ref 0.44–1.00)
GFR, Estimated: >60 mL/min (ref >60)

## 2020-11-04 LAB — POCT PREGNANCY, URINE: Preg Test, Ur: NEGATIVE

## 2020-11-04 SURGERY — ARTHROPLASTY, HIP, TOTAL, ANTERIOR APPROACH
Anesthesia: General | Site: Hip | Laterality: Right

## 2020-11-04 MED ORDER — ONDANSETRON HCL 4 MG/2ML IJ SOLN
INTRAMUSCULAR | Status: AC
  Filled 2020-11-04: qty 2

## 2020-11-04 MED ORDER — DEXMEDETOMIDINE (PRECEDEX) IN NS 20 MCG/5ML (4 MCG/ML) IV SYRINGE
PREFILLED_SYRINGE | INTRAVENOUS | Status: DC | PRN
  Administered 2020-11-04: 12 ug via INTRAVENOUS
  Administered 2020-11-04: 8 ug via INTRAVENOUS
  Administered 2020-11-04: 12 ug via INTRAVENOUS

## 2020-11-04 MED ORDER — MIDAZOLAM HCL 2 MG/2ML IJ SOLN
INTRAMUSCULAR | Status: AC
  Filled 2020-11-04: qty 2

## 2020-11-04 MED ORDER — PROPOFOL 10 MG/ML IV BOLUS
INTRAVENOUS | Status: DC | PRN
  Administered 2020-11-04: 200 mg via INTRAVENOUS

## 2020-11-04 MED ORDER — FENTANYL CITRATE (PF) 100 MCG/2ML IJ SOLN: 25.0000 ug | INTRAMUSCULAR | Status: DC | PRN

## 2020-11-04 MED ORDER — KETOROLAC TROMETHAMINE 30 MG/ML IJ SOLN
INTRAMUSCULAR | Status: AC
  Filled 2020-11-04: qty 1

## 2020-11-04 MED ORDER — DEXTROSE 5 % IV SOLN
INTRAVENOUS | Status: DC | PRN
  Administered 2020-11-04: 3 g via INTRAVENOUS

## 2020-11-04 MED ORDER — METOCLOPRAMIDE HCL 5 MG/ML IJ SOLN: 5.0000 mg | Freq: Three times a day (TID) | INTRAMUSCULAR | Status: DC | PRN

## 2020-11-04 MED ORDER — TRAMADOL HCL 50 MG PO TABS
ORAL_TABLET | ORAL | Status: AC
  Administered 2020-11-04: 50 mg via ORAL
  Filled 2020-11-04: qty 1

## 2020-11-04 MED ORDER — HYDROCHLOROTHIAZIDE 12.5 MG PO CAPS
12.5000 mg | ORAL_CAPSULE | Freq: Every day | ORAL | Status: DC
  Administered 2020-11-06: 12.5 mg via ORAL
  Filled 2020-11-04 (×5): qty 1

## 2020-11-04 MED ORDER — FENTANYL CITRATE (PF) 100 MCG/2ML IJ SOLN
INTRAMUSCULAR | Status: AC
  Administered 2020-11-04: 25 ug via INTRAVENOUS
  Filled 2020-11-04: qty 2

## 2020-11-04 MED ORDER — ACETAMINOPHEN 10 MG/ML IV SOLN
INTRAVENOUS | Status: AC
  Filled 2020-11-04: qty 100

## 2020-11-04 MED ORDER — HYDROMORPHONE HCL 1 MG/ML IJ SOLN
0.2500 mg | INTRAMUSCULAR | Status: AC | PRN
  Administered 2020-11-04 (×6): 0.25 mg via INTRAVENOUS

## 2020-11-04 MED ORDER — PROPOFOL 1000 MG/100ML IV EMUL
INTRAVENOUS | Status: AC
  Filled 2020-11-04: qty 100

## 2020-11-04 MED ORDER — ENOXAPARIN SODIUM 40 MG/0.4ML IJ SOSY
40.0000 mg | PREFILLED_SYRINGE | INTRAMUSCULAR | Status: DC
  Administered 2020-11-05 – 2020-11-09 (×5): 40 mg via SUBCUTANEOUS
  Filled 2020-11-04 (×5): qty 0.4

## 2020-11-04 MED ORDER — LIDOCAINE HCL (PF) 2 % IJ SOLN
INTRAMUSCULAR | Status: AC
  Filled 2020-11-04: qty 5

## 2020-11-04 MED ORDER — BUPIVACAINE HCL (PF) 0.5 % IJ SOLN
INTRAMUSCULAR | Status: AC
  Filled 2020-11-04: qty 10

## 2020-11-04 MED ORDER — SUGAMMADEX SODIUM 200 MG/2ML IV SOLN
INTRAVENOUS | Status: DC | PRN
  Administered 2020-11-04: 200 mg via INTRAVENOUS

## 2020-11-04 MED ORDER — LISINOPRIL-HYDROCHLOROTHIAZIDE 10-12.5 MG PO TABS: 1.0000 | ORAL_TABLET | Freq: Every day | ORAL | Status: DC

## 2020-11-04 MED ORDER — FAMOTIDINE 20 MG PO TABS
ORAL_TABLET | ORAL | Status: AC
  Administered 2020-11-04: 20 mg via ORAL
  Filled 2020-11-04: qty 1

## 2020-11-04 MED ORDER — DEXAMETHASONE SODIUM PHOSPHATE 10 MG/ML IJ SOLN
INTRAMUSCULAR | Status: DC | PRN
  Administered 2020-11-04: 10 mg via INTRAVENOUS

## 2020-11-04 MED ORDER — TRAMADOL HCL 50 MG PO TABS
50.0000 mg | ORAL_TABLET | Freq: Four times a day (QID) | ORAL | Status: DC
  Administered 2020-11-05 – 2020-11-09 (×18): 50 mg via ORAL
  Filled 2020-11-04 (×18): qty 1

## 2020-11-04 MED ORDER — FENTANYL CITRATE (PF) 100 MCG/2ML IJ SOLN
INTRAMUSCULAR | Status: AC
  Filled 2020-11-04: qty 2

## 2020-11-04 MED ORDER — MIDAZOLAM HCL 2 MG/2ML IJ SOLN
INTRAMUSCULAR | Status: AC
  Administered 2020-11-04: 1 mg via INTRAVENOUS
  Filled 2020-11-04: qty 2

## 2020-11-04 MED ORDER — LISINOPRIL 10 MG PO TABS
10.0000 mg | ORAL_TABLET | Freq: Every day | ORAL | Status: DC
  Administered 2020-11-06: 10 mg via ORAL
  Filled 2020-11-04 (×5): qty 1

## 2020-11-04 MED ORDER — METHOCARBAMOL 500 MG PO TABS
500.0000 mg | ORAL_TABLET | Freq: Four times a day (QID) | ORAL | Status: DC | PRN
  Administered 2020-11-05 – 2020-11-09 (×9): 500 mg via ORAL
  Filled 2020-11-04 (×9): qty 1

## 2020-11-04 MED ORDER — HYDROMORPHONE HCL 1 MG/ML IJ SOLN
0.5000 mg | INTRAMUSCULAR | Status: DC | PRN
  Administered 2020-11-04 – 2020-11-08 (×13): 1 mg via INTRAVENOUS
  Filled 2020-11-04 (×13): qty 1

## 2020-11-04 MED ORDER — TERBINAFINE HCL 1 % EX CREA
1.0000 "application " | TOPICAL_CREAM | Freq: Two times a day (BID) | CUTANEOUS | Status: DC
  Administered 2020-11-07 – 2020-11-09 (×3): 1 via TOPICAL
  Filled 2020-11-04: qty 12

## 2020-11-04 MED ORDER — DEXMEDETOMIDINE (PRECEDEX) IN NS 20 MCG/5ML (4 MCG/ML) IV SYRINGE
PREFILLED_SYRINGE | INTRAVENOUS | Status: AC
  Filled 2020-11-04: qty 5

## 2020-11-04 MED ORDER — CEFAZOLIN IN SODIUM CHLORIDE 3-0.9 GM/100ML-% IV SOLN
3.0000 g | INTRAVENOUS | Status: DC
  Filled 2020-11-04: qty 100

## 2020-11-04 MED ORDER — KETOROLAC TROMETHAMINE 30 MG/ML IJ SOLN
30.0000 mg | Freq: Once | INTRAMUSCULAR | Status: AC
  Administered 2020-11-04: 30 mg via INTRAVENOUS

## 2020-11-04 MED ORDER — ENOXAPARIN SODIUM 40 MG/0.4ML IJ SOSY: 40.0000 mg | PREFILLED_SYRINGE | INTRAMUSCULAR | Status: DC

## 2020-11-04 MED ORDER — PHENOL 1.4 % MT LIQD
1.0000 | OROMUCOSAL | Status: DC | PRN
  Filled 2020-11-04: qty 177

## 2020-11-04 MED ORDER — HYDROMORPHONE HCL 1 MG/ML IJ SOLN
INTRAMUSCULAR | Status: AC
  Administered 2020-11-04: 0.25 mg via INTRAVENOUS
  Filled 2020-11-04: qty 1

## 2020-11-04 MED ORDER — OXYCODONE HCL 5 MG PO TABS
ORAL_TABLET | ORAL | Status: AC
  Administered 2020-11-04: 10 mg via ORAL
  Filled 2020-11-04: qty 2

## 2020-11-04 MED ORDER — LIDOCAINE HCL (CARDIAC) PF 100 MG/5ML IV SOSY
PREFILLED_SYRINGE | INTRAVENOUS | Status: DC | PRN
  Administered 2020-11-04: 100 mg via INTRAVENOUS

## 2020-11-04 MED ORDER — ORAL CARE MOUTH RINSE: 15.0000 mL | Freq: Once | OROMUCOSAL | Status: AC

## 2020-11-04 MED ORDER — ONDANSETRON HCL 4 MG/2ML IJ SOLN
INTRAMUSCULAR | Status: DC | PRN
  Administered 2020-11-04: 4 mg via INTRAVENOUS

## 2020-11-04 MED ORDER — POLYETHYLENE GLYCOL 3350 17 G PO PACK
17.0000 g | PACK | Freq: Every day | ORAL | Status: DC | PRN
  Administered 2020-11-07: 17 g via ORAL
  Filled 2020-11-04: qty 1

## 2020-11-04 MED ORDER — CHLORHEXIDINE GLUCONATE 0.12 % MT SOLN: 15.0000 mL | Freq: Once | OROMUCOSAL | Status: AC

## 2020-11-04 MED ORDER — KETAMINE HCL 50 MG/ML IJ SOLN
INTRAMUSCULAR | Status: DC | PRN
  Administered 2020-11-04: 50 mg via INTRAVENOUS

## 2020-11-04 MED ORDER — SODIUM CHLORIDE 0.9 % IV SOLN: INTRAVENOUS | Status: DC

## 2020-11-04 MED ORDER — OXYCODONE HCL 5 MG PO TABS
10.0000 mg | ORAL_TABLET | ORAL | Status: DC | PRN
  Administered 2020-11-05: 15 mg via ORAL
  Administered 2020-11-06 – 2020-11-08 (×5): 10 mg via ORAL
  Filled 2020-11-04 (×2): qty 2
  Filled 2020-11-04: qty 3
  Filled 2020-11-04 (×6): qty 2

## 2020-11-04 MED ORDER — FAMOTIDINE 20 MG PO TABS: 20.0000 mg | ORAL_TABLET | Freq: Once | ORAL | Status: AC

## 2020-11-04 MED ORDER — ACETAMINOPHEN 10 MG/ML IV SOLN
INTRAVENOUS | Status: DC | PRN
  Administered 2020-11-04: 1000 mg via INTRAVENOUS

## 2020-11-04 MED ORDER — ONDANSETRON HCL 4 MG/2ML IJ SOLN: 4.0000 mg | Freq: Four times a day (QID) | INTRAMUSCULAR | Status: DC | PRN

## 2020-11-04 MED ORDER — DEXAMETHASONE SODIUM PHOSPHATE 10 MG/ML IJ SOLN
INTRAMUSCULAR | Status: AC
  Filled 2020-11-04: qty 1

## 2020-11-04 MED ORDER — ROCURONIUM BROMIDE 100 MG/10ML IV SOLN
INTRAVENOUS | Status: DC | PRN
  Administered 2020-11-04: 10 mg via INTRAVENOUS
  Administered 2020-11-04: 30 mg via INTRAVENOUS
  Administered 2020-11-04: 20 mg via INTRAVENOUS

## 2020-11-04 MED ORDER — ZOLPIDEM TARTRATE 5 MG PO TABS: 5.0000 mg | ORAL_TABLET | Freq: Every evening | ORAL | Status: DC | PRN

## 2020-11-04 MED ORDER — ACETAMINOPHEN 325 MG PO TABS
325.0000 mg | ORAL_TABLET | Freq: Four times a day (QID) | ORAL | Status: DC | PRN
  Administered 2020-11-05: 650 mg via ORAL
  Filled 2020-11-04: qty 2

## 2020-11-04 MED ORDER — MAGNESIUM CITRATE PO SOLN
1.0000 | Freq: Once | ORAL | Status: DC | PRN
  Filled 2020-11-04: qty 296

## 2020-11-04 MED ORDER — MIDAZOLAM HCL 2 MG/2ML IJ SOLN: 1.0000 mg | Freq: Once | INTRAMUSCULAR | Status: AC

## 2020-11-04 MED ORDER — DOCUSATE SODIUM 100 MG PO CAPS
100.0000 mg | ORAL_CAPSULE | Freq: Two times a day (BID) | ORAL | Status: DC
  Administered 2020-11-04 – 2020-11-09 (×10): 100 mg via ORAL
  Filled 2020-11-04 (×10): qty 1

## 2020-11-04 MED ORDER — KETAMINE HCL 50 MG/ML IJ SOLN: INTRAMUSCULAR | Status: DC | PRN

## 2020-11-04 MED ORDER — SODIUM CHLORIDE 0.9 % IV SOLN
INTRAVENOUS | Status: DC | PRN
  Administered 2020-11-04: 60 mL

## 2020-11-04 MED ORDER — MIDAZOLAM HCL 5 MG/5ML IJ SOLN
INTRAMUSCULAR | Status: DC | PRN
  Administered 2020-11-04 (×2): 2 mg via INTRAVENOUS

## 2020-11-04 MED ORDER — CEFAZOLIN IN SODIUM CHLORIDE 3-0.9 GM/100ML-% IV SOLN
3.0000 g | Freq: Four times a day (QID) | INTRAVENOUS | Status: AC
  Administered 2020-11-04 – 2020-11-05 (×2): 3 g via INTRAVENOUS
  Filled 2020-11-04 (×2): qty 100

## 2020-11-04 MED ORDER — SUCCINYLCHOLINE CHLORIDE 20 MG/ML IJ SOLN
INTRAMUSCULAR | Status: DC | PRN
  Administered 2020-11-04: 120 mg via INTRAVENOUS

## 2020-11-04 MED ORDER — DIPHENHYDRAMINE HCL 12.5 MG/5ML PO ELIX
12.5000 mg | ORAL_SOLUTION | ORAL | Status: DC | PRN
  Filled 2020-11-04: qty 10

## 2020-11-04 MED ORDER — OXYCODONE HCL 5 MG PO TABS
5.0000 mg | ORAL_TABLET | ORAL | Status: DC | PRN
  Administered 2020-11-05 – 2020-11-08 (×8): 10 mg via ORAL
  Administered 2020-11-09: 5 mg via ORAL
  Administered 2020-11-09: 10 mg via ORAL
  Administered 2020-11-09 (×2): 5 mg via ORAL
  Administered 2020-11-09: 10 mg via ORAL
  Filled 2020-11-04: qty 2
  Filled 2020-11-04: qty 1
  Filled 2020-11-04: qty 2
  Filled 2020-11-04: qty 1
  Filled 2020-11-04 (×3): qty 2
  Filled 2020-11-04: qty 1
  Filled 2020-11-04 (×4): qty 2

## 2020-11-04 MED ORDER — BISACODYL 10 MG RE SUPP
10.0000 mg | Freq: Every day | RECTAL | Status: DC | PRN
  Administered 2020-11-07: 10 mg via RECTAL
  Filled 2020-11-04: qty 1

## 2020-11-04 MED ORDER — METHOCARBAMOL 500 MG PO TABS
ORAL_TABLET | ORAL | Status: AC
  Administered 2020-11-04: 500 mg via ORAL
  Filled 2020-11-04: qty 1

## 2020-11-04 MED ORDER — ALUM & MAG HYDROXIDE-SIMETH 200-200-20 MG/5ML PO SUSP: 30.0000 mL | ORAL | Status: DC | PRN

## 2020-11-04 MED ORDER — METOCLOPRAMIDE HCL 10 MG PO TABS: 5.0000 mg | ORAL_TABLET | Freq: Three times a day (TID) | ORAL | Status: DC | PRN

## 2020-11-04 MED ORDER — LACTATED RINGERS IV SOLN: INTRAVENOUS | Status: DC

## 2020-11-04 MED ORDER — MENTHOL 3 MG MT LOZG
1.0000 | LOZENGE | OROMUCOSAL | Status: DC | PRN
  Filled 2020-11-04: qty 9

## 2020-11-04 MED ORDER — GLYCOPYRROLATE 0.2 MG/ML IJ SOLN
INTRAMUSCULAR | Status: DC | PRN
  Administered 2020-11-04: .2 mg via INTRAVENOUS

## 2020-11-04 MED ORDER — FENTANYL CITRATE (PF) 100 MCG/2ML IJ SOLN
25.0000 ug | INTRAMUSCULAR | Status: DC | PRN
  Administered 2020-11-04 (×2): 25 ug via INTRAVENOUS

## 2020-11-04 MED ORDER — CHLORHEXIDINE GLUCONATE 0.12 % MT SOLN
OROMUCOSAL | Status: AC
  Administered 2020-11-04: 15 mL via OROMUCOSAL
  Filled 2020-11-04: qty 15

## 2020-11-04 MED ORDER — ENOXAPARIN SODIUM 80 MG/0.8ML IJ SOSY: 0.5000 mg/kg | PREFILLED_SYRINGE | INTRAMUSCULAR | Status: DC

## 2020-11-04 MED ORDER — BUPIVACAINE-EPINEPHRINE 0.25% -1:200000 IJ SOLN
INTRAMUSCULAR | Status: DC | PRN
  Administered 2020-11-04: 30 mL

## 2020-11-04 MED ORDER — ONDANSETRON HCL 4 MG/2ML IJ SOLN: 4.0000 mg | Freq: Once | INTRAMUSCULAR | Status: DC | PRN

## 2020-11-04 MED ORDER — ONDANSETRON HCL 4 MG PO TABS: 4.0000 mg | ORAL_TABLET | Freq: Four times a day (QID) | ORAL | Status: DC | PRN

## 2020-11-04 MED ORDER — METHOCARBAMOL 1000 MG/10ML IJ SOLN
500.0000 mg | Freq: Four times a day (QID) | INTRAVENOUS | Status: DC | PRN
  Filled 2020-11-04: qty 5

## 2020-11-04 MED ORDER — KETAMINE HCL 50 MG/ML IJ SOLN
INTRAMUSCULAR | Status: AC
  Filled 2020-11-04: qty 1

## 2020-11-04 MED ORDER — ONDANSETRON HCL 4 MG/2ML IJ SOLN
4.0000 mg | Freq: Once | INTRAMUSCULAR | Status: DC | PRN
Start: 2020-11-04 — End: 2020-11-04

## 2020-11-04 MED ORDER — HYDROMORPHONE HCL 1 MG/ML IJ SOLN
0.2500 mg | INTRAMUSCULAR | Status: DC | PRN
  Administered 2020-11-04: 0.25 mg via INTRAVENOUS

## 2020-11-04 MED ORDER — FENTANYL CITRATE (PF) 100 MCG/2ML IJ SOLN
INTRAMUSCULAR | Status: DC | PRN
  Administered 2020-11-04 (×4): 50 ug via INTRAVENOUS

## 2020-11-04 MED ORDER — GLYCOPYRROLATE 0.2 MG/ML IJ SOLN
INTRAMUSCULAR | Status: AC
  Filled 2020-11-04: qty 1

## 2020-11-04 MED ORDER — ALBUTEROL SULFATE HFA 108 (90 BASE) MCG/ACT IN AERS
INHALATION_SPRAY | RESPIRATORY_TRACT | Status: AC
  Filled 2020-11-04: qty 6.7

## 2020-11-04 SURGICAL SUPPLY — 62 items
BLADE SAGITTAL AGGR TOOTH XLG (BLADE) ×3 IMPLANT
BNDG COHESIVE 6X5 TAN STRL LF (GAUZE/BANDAGES/DRESSINGS) ×9 IMPLANT
CANISTER SUCT 1200ML W/VALVE (MISCELLANEOUS) ×3 IMPLANT
CANISTER WOUND CARE 500ML ATS (WOUND CARE) ×3 IMPLANT
CHLORAPREP W/TINT 26 (MISCELLANEOUS) ×3 IMPLANT
COVER BACK TABLE REUSABLE LG (DRAPES) ×3 IMPLANT
COVER WAND RF STERILE (DRAPES) ×3 IMPLANT
DRAPE 3/4 80X56 (DRAPES) ×9 IMPLANT
DRAPE C-ARM XRAY 36X54 (DRAPES) ×3 IMPLANT
DRAPE INCISE IOBAN 66X60 STRL (DRAPES) IMPLANT
DRAPE POUCH INSTRU U-SHP 10X18 (DRAPES) ×3 IMPLANT
DRESSING SURGICEL FIBRLLR 1X2 (HEMOSTASIS) ×4 IMPLANT
DRSG MEPILEX SACRM 8.7X9.8 (GAUZE/BANDAGES/DRESSINGS) ×3 IMPLANT
DRSG OPSITE POSTOP 4X8 (GAUZE/BANDAGES/DRESSINGS) ×6 IMPLANT
DRSG SURGICEL FIBRILLAR 1X2 (HEMOSTASIS) ×6
ELECT BLADE 6.5 EXT (BLADE) ×3 IMPLANT
ELECT REM PT RETURN 9FT ADLT (ELECTROSURGICAL) ×3
ELECTRODE REM PT RTRN 9FT ADLT (ELECTROSURGICAL) ×2 IMPLANT
GLOVE SURG SYN 9.0  PF PI (GLOVE) ×2
GLOVE SURG SYN 9.0 PF PI (GLOVE) ×4 IMPLANT
GLOVE SURG UNDER POLY LF SZ9 (GLOVE) ×3 IMPLANT
GOWN SRG 2XL LVL 4 RGLN SLV (GOWNS) ×2 IMPLANT
GOWN STRL NON-REIN 2XL LVL4 (GOWNS) ×1
GOWN STRL REUS W/ TWL LRG LVL3 (GOWN DISPOSABLE) ×2 IMPLANT
GOWN STRL REUS W/TWL LRG LVL3 (GOWN DISPOSABLE) ×1
HEAD FEMORAL SZ28 LGE BIOLOX (Head) ×3 IMPLANT
HEMOVAC 400CC 10FR (MISCELLANEOUS) IMPLANT
HOLDER FOLEY CATH W/STRAP (MISCELLANEOUS) ×3 IMPLANT
HOOD PEEL AWAY FLYTE STAYCOOL (MISCELLANEOUS) ×3 IMPLANT
IRRIGATION SURGIPHOR STRL (IV SOLUTION) IMPLANT
KIT PREVENA INCISION MGT 13 (CANNISTER) ×3 IMPLANT
LINER DBL MOB SZ 0 52MM (Liner) ×3 IMPLANT
MANIFOLD NEPTUNE II (INSTRUMENTS) ×3 IMPLANT
MASTERLOC HIP LATERAL S4 (Hips) ×3 IMPLANT
MAT ABSORB  FLUID 56X50 GRAY (MISCELLANEOUS) ×1
MAT ABSORB FLUID 56X50 GRAY (MISCELLANEOUS) ×2 IMPLANT
NDL SAFETY ECLIPSE 18X1.5 (NEEDLE) ×2 IMPLANT
NEEDLE HYPO 18GX1.5 SHARP (NEEDLE) ×1
NEEDLE SPNL 20GX3.5 QUINCKE YW (NEEDLE) ×6 IMPLANT
NS IRRIG 1000ML POUR BTL (IV SOLUTION) ×3 IMPLANT
PACK HIP COMPR (MISCELLANEOUS) ×3 IMPLANT
SCALPEL PROTECTED #10 DISP (BLADE) ×6 IMPLANT
SEALER BIPOLAR AQUA 6.0 (INSTRUMENTS) ×3 IMPLANT
SHELL ACETABULAR SZ 52 DM (Shell) ×3 IMPLANT
SOL PREP PVP 2OZ (MISCELLANEOUS) ×3
SOLUTION PREP PVP 2OZ (MISCELLANEOUS) ×2 IMPLANT
SPONGE DRAIN TRACH 4X4 STRL 2S (GAUZE/BANDAGES/DRESSINGS) ×3 IMPLANT
STAPLER SKIN PROX 35W (STAPLE) ×3 IMPLANT
STRAP SAFETY 5IN WIDE (MISCELLANEOUS) ×3 IMPLANT
SUT DVC 2 QUILL PDO  T11 36X36 (SUTURE) ×1
SUT DVC 2 QUILL PDO T11 36X36 (SUTURE) ×2 IMPLANT
SUT SILK 0 (SUTURE) ×1
SUT SILK 0 30XBRD TIE 6 (SUTURE) ×2 IMPLANT
SUT V-LOC 90 ABS DVC 3-0 CL (SUTURE) ×3 IMPLANT
SUT VIC AB 1 CT1 36 (SUTURE) ×3 IMPLANT
SYR 20ML LL LF (SYRINGE) ×3 IMPLANT
SYR 30ML LL (SYRINGE) ×3 IMPLANT
SYR 50ML LL SCALE MARK (SYRINGE) ×6 IMPLANT
SYR BULB IRRIG 60ML STRL (SYRINGE) ×3 IMPLANT
TAPE MICROFOAM 4IN (TAPE) ×3 IMPLANT
TOWEL OR 17X26 4PK STRL BLUE (TOWEL DISPOSABLE) ×3 IMPLANT
TRAY FOLEY MTR SLVR 16FR STAT (SET/KITS/TRAYS/PACK) ×3 IMPLANT

## 2020-11-04 NOTE — Progress Notes (Signed)
Pt c/o 10/10 pain after of Fentanyl. Dr. Noralyn Pick notified. Acknowledged. Orders received.

## 2020-11-04 NOTE — Progress Notes (Signed)
Pt anxious, unable to get comfortable. Dr. Noralyn Pick notified. Acknowledged. Orders received.

## 2020-11-04 NOTE — Anesthesia Procedure Notes (Signed)
Procedure Name: Intubation Performed by: Mathews Argyle, CRNA Pre-anesthesia Checklist: Patient identified, Patient being monitored, Timeout performed, Emergency Drugs available and Suction available Patient Re-evaluated:Patient Re-evaluated prior to induction Oxygen Delivery Method: Circle system utilized Preoxygenation: Pre-oxygenation with 100% oxygen Induction Type: IV induction and Rapid sequence Ventilation: Mask ventilation without difficulty Laryngoscope Size: 3 and McGraph Grade View: Grade I Tube type: Oral Tube size: 7.0 mm Number of attempts: 1 Airway Equipment and Method: Stylet,  Video-laryngoscopy and Patient positioned with wedge pillow Placement Confirmation: ETT inserted through vocal cords under direct vision,  positive ETCO2 and breath sounds checked- equal and bilateral Secured at: 21 cm Tube secured with: Tape Dental Injury: Teeth and Oropharynx as per pre-operative assessment  Difficulty Due To: Difficulty was anticipated, Difficult Airway- due to reduced neck mobility and Difficult Airway- due to limited oral opening Future Recommendations: Recommend- induction with short-acting agent, and alternative techniques readily available

## 2020-11-04 NOTE — H&P (Signed)
Chief Complaint  Patient presents with  . Right Hip - Pain  History & Physical for Right THA with Andrea Hull on 11/04/20   Reason for Visit Andrea Hull is a 45 y.o. who presents today for history and physical. She is to undergo a right total hip arthroplasty on 11/04/2020. Was last seen in the clinic on 09/15/2020. There have been no change in her condition since that time.  The patient states she has had 2 cortisone injections to her right hip last year, with her last injection being in 01/2020. She states her right hip pain has gotten a lot worse since she has had the injections. She states she takes 12 mg of melatonin to get sleep. When she is lying down or sitting with her legs up, her pain level is almost 0 out of 10. She has severe pain with any weightbearing, she has to stop every 5 to 10 minutes. The patient states she has a crutch at home. She denies any pain in the left hip.   The patient has high cholesterol, and she is working on lowering her weight. She has lost 60 pounds since last year, and she has been working on it. She is not pregnant.  Pain to the right hip has increased to the point that is significantly interfering her activities of daily living and she desires to proceed with hip replacement.  Past Medical History Past Medical History:  Diagnosis Date  . Arthritis  . Heavy periods  . Hypertension  . Morbid obesity with BMI of 50.0-59.9, adult (CMS-HCC)  . Painful menstrual periods  . Sleep apnea, obstructive  . Tobacco abuse   Past Surgical History Past Surgical History:  Procedure Laterality Date  . BREAST SURGERY  breast biopsy  . CESAREAN SECTION  . CHOLECYSTECTOMY  . DILATION AND CURETTAGE, DIAGNOSTIC / THERAPEUTIC   Past Family History Family History  Problem Relation Age of Onset  . Arthritis Mother  . Hashimoto's thyroiditis Mother  . Celiac disease Mother  . Arthritis Father  . Alcohol abuse Father  . Hyperlipidemia (Elevated cholesterol) Father  .  High blood pressure (Hypertension) Father  . Diabetes Father  . Leukemia Father  . Anxiety Sister  . Personality disorder Sister  . High blood pressure (Hypertension) Sister  . High blood pressure (Hypertension) Brother  . Diabetes Maternal Grandmother  . Lung cancer Maternal Grandfather  . Alcohol abuse Paternal Grandmother  . Breast cancer Paternal Grandmother  . Arthritis Paternal Grandfather  . Alcohol abuse Paternal Grandfather  . Heart disease Paternal Grandfather  . Hashimoto's thyroiditis Maternal Aunt  . Hashimoto's thyroiditis Maternal Uncle  . Hashimoto's thyroiditis Other   Medications Current Outpatient Medications Ordered in Epic  Medication Sig Dispense Refill  . acetaminophen (TYLENOL) 650 MG ER tablet Take 1,300 mg by mouth 3 (three) times daily  . diclofenac (VOLTAREN) 75 MG EC tablet Take 75 mg by mouth 2 (two) times daily with meals  . lisinopriL-hydrochlorothiazide (ZESTORETIC) 10-12.5 mg tablet Take 1 tablet by mouth once daily  . MELATONIN ORAL Take 15 mg by mouth nightly   No current Epic-ordered facility-administered medications on file.   Allergies Allergies  Allergen Reactions  . Levofloxacin Other (See Comments)  Joint pain    Review of Systems A comprehensive 14 point ROS was performed, reviewed, and the pertinent orthopaedic findings are documented in the HPI.  Exam BP 124/80 (BP Location: Left upper arm, Patient Position: Sitting, BP Cuff Size: Adult)  Ht 167.6 cm (5\' 6" )  Wt (!) 144.2 kg (318 lb)  BMI 51.33 kg/m   General: Well-developed well-nourished female seen in no acute distress. She is noted to ambulate with an antalgic gait and uses a crutch for ambulation  HEENT: Atraumatic,normocephalic. Pupils are equal and reactive to light. Oropharynx is clear with moist mucosa  Lungs: Clear to auscultation bilaterally   Cardiovascular: Regular rate and rhythm. Normal S1, S2. No murmurs. No appreciable gallops or rubs. Peripheral  pulses are palpable.  Abdomen: Soft, non-tender, nondistended. Bowel sounds present  Extremity: No tenderness noted to palpation on today's visit. She has 0 degrees of internal rotation with external rotation being 20 degrees.  Neurological:  The patient is alert and oriented Sensation to light touch appears to be intact and within normal limits Gross motor strength appeared to be equal to 5/5  Vascular :  Peripheral pulses felt to be palpable. Capillary refill appears to be intact and within normal limits  X-ray  X-rays taken on 08/18/2020 showed complete loss of the articular space and fairly significant bone loss of the superior acetabulum. She is also noted to have deformity of the femoral head. Indications possible for avascular necrosis. There was noted to be quite a bit of bone mass deformity. Cystic changes are noted to the femoral head as well as acetabulum.  Impression  1. Degenerative arthrosis right hip  Plan   1. Patient is to discontinue her Voltaren as of today and was advised not to do any other anti-inflammatory medication for the next week 2. Return to clinic 2 weeks postop 3. Patient is planning on going home following surgery  This note was generated in part with voice recognition software and I apologize for any typographical errors that were not detected and corrected   Tera Partridge PA Electronically signed by Latanya Maudlin, PA at 10/27/2020 11:22 AM EDT  Reviewed  H+P. No changes noted.

## 2020-11-04 NOTE — Op Note (Signed)
11/04/2020  1:45 PM  PATIENT:  Andrea Hull  45 y.o. female  PRE-OPERATIVE DIAGNOSIS:  Primary osteoarthritis of right hip M16.11  POST-OPERATIVE DIAGNOSIS:  Primary osteoarthritis of right hip M16.11  PROCEDURE:  Procedure(s): TOTAL HIP ARTHROPLASTY ANTERIOR APPROACH (Right) APPLICATION OF CELL SAVER (N/A)  SURGEON: Leitha Schuller, MD  ASSISTANTS: None  ANESTHESIA:   spinal  EBL:  Total I/O In: 300 [Blood:250; IV Piggyback:50] Out: 600 [Blood:600]  BLOOD ADMINISTERED:150 CC CELLSAVER  DRAINS: Incisional wound VAC   LOCAL MEDICATIONS USED:  MARCAINE    and OTHER Exparel   SPECIMEN:  Source of Specimen:  Right femoral head  DISPOSITION OF SPECIMEN:  PATHOLOGY  COUNTS:  YES  TOURNIQUET:  * No tourniquets in log *  IMPLANTS: Medacta Master lock 4 lateralized stem 52 Mpact TM cup and liner and L ceramic 28 mm head  DICTATION: .Dragon Dictation   The patient was brought to the operating room and after spinal anesthesia was obtained patient was placed on the operative table with the ipsilateral foot into the Medacta attachment, contralateral leg on a well-padded table. C-arm was brought in and preop template x-ray taken. After prepping and draping in usual sterile fashion appropriate patient identification and timeout procedures were completed. Anterior approach to the hip was obtained and centered over the greater trochanter and TFL muscle. The subcutaneous tissue was incised hemostasis being achieved by electrocautery. TFL fascia was incised and the muscle retracted laterally deep retractor placed. The lateral femoral circumflex vessels were identified and ligated. The anterior capsule was exposed and a capsulotomy performed. The neck was identified and a femoral neck cut carried out with a saw. The head was removed without difficulty and showed sclerotic femoral head and acetabulum. Reaming was carried out to 50 mm and a 52 mm cup trial gave appropriate tightness to the  acetabular component a 52 DM cup was impacted into position. The leg was then externally rotated and ischiofemoral and pubofemoral releases carried out. The femur was sequentially broached to a size 4, size 4 lateralized with S and then L heads trials were placed and the final components chosen. The 4 lateralized stem was inserted along with a L ceramic 28 mm head and 52 mm liner. The hip was reduced and was stable the wound was thoroughly irrigated with fibrillar placed along the posterior capsule and medial neck. The deep fascia ws closed using a heavy Quill after infiltration of 30 cc of quarter percent Sensorcaine with epinephrine diluted with Exparel throughout the case .3-0 V-loc to close the skin with skin staples.  Incisional wound VAC applied and patient was sent to recovery in stable condition.   PLAN OF CARE: Admit for overnight observation

## 2020-11-04 NOTE — Transfer of Care (Signed)
Immediate Anesthesia Transfer of Care Note  Patient: Andrea Hull  Procedure(s) Performed: TOTAL HIP ARTHROPLASTY ANTERIOR APPROACH (Right Hip) APPLICATION OF CELL SAVER (N/A )  Patient Location: PACU  Anesthesia Type:General  Level of Consciousness: awake and alert   Airway & Oxygen Therapy: Patient Spontanous Breathing and Patient connected to face mask oxygen  Post-op Assessment: Report given to RN and Post -op Vital signs reviewed and stable  Post vital signs: Reviewed  Last Vitals:  Vitals Value Taken Time  BP    Temp    Pulse 94 11/04/20 1349  Resp 18 11/04/20 1349  SpO2 93 % 11/04/20 1349  Vitals shown include unvalidated device data.  Last Pain:  Vitals:   11/04/20 1109  TempSrc: Temporal  PainSc: 8          Complications: No complications documented.

## 2020-11-04 NOTE — Anesthesia Preprocedure Evaluation (Signed)
Anesthesia Evaluation  Patient identified by MRN, date of birth, ID band Patient awake    Reviewed: Allergy & Precautions, NPO status , Patient's Chart, lab work & pertinent test results  History of Anesthesia Complications Negative for: history of anesthetic complications  Airway Mallampati: III  TM Distance: >3 FB Neck ROM: Full    Dental no notable dental hx.    Pulmonary sleep apnea and Continuous Positive Airway Pressure Ventilation , Current Smoker, former smoker,  somkes 1ppd   Pulmonary exam normal breath sounds clear to auscultation       Cardiovascular hypertension, negative cardio ROS Normal cardiovascular exam Rhythm:Regular Rate:Normal     Neuro/Psych negative neurological ROS  negative psych ROS   GI/Hepatic Neg liver ROS, GERD  Medicated,  Endo/Other  Morbid obesity  Renal/GU negative Renal ROS  negative genitourinary   Musculoskeletal  (+) Arthritis ,   Abdominal (+) + obese,   Peds negative pediatric ROS (+)  Hematology negative hematology ROS (+)   Anesthesia Other Findings Past Medical History: No date: Arthritis No date: GERD (gastroesophageal reflux disease)     Comment:  H/O No date: Hypertension No date: Morbid obesity with BMI of 50.0-59.9, adult (HCC) No date: Sleep apnea, obstructive     Comment:  CPAP No date: Tobacco abuse  Reproductive/Obstetrics negative OB ROS                             Anesthesia Physical  Anesthesia Plan  ASA: III  Anesthesia Plan:    Post-op Pain Management:    Induction: Intravenous  PONV Risk Score and Plan:   Airway Management Planned:   Additional Equipment:   Intra-op Plan:   Post-operative Plan:   Informed Consent: I have reviewed the patients History and Physical, chart, labs and discussed the procedure including the risks, benefits and alternatives for the proposed anesthesia with the patient or  authorized representative who has indicated his/her understanding and acceptance.     Dental advisory given  Plan Discussed with: CRNA and Surgeon  Anesthesia Plan Comments:         Anesthesia Quick Evaluation

## 2020-11-05 ENCOUNTER — Encounter: Payer: Self-pay | Admitting: Orthopedic Surgery

## 2020-11-05 LAB — CBC
HCT: 37.7 % (ref 36.0–46.0)
Hemoglobin: 12.9 g/dL (ref 12.0–15.0)
MCH: 32.7 pg (ref 26.0–34.0)
MCHC: 34.2 g/dL (ref 30.0–36.0)
MCV: 95.7 fL (ref 80.0–100.0)
Platelets: 272 10*3/uL (ref 150–400)
RBC: 3.94 MIL/uL (ref 3.87–5.11)
RDW: 12.1 % (ref 11.5–15.5)
WBC: 20.3 10*3/uL — ABNORMAL HIGH (ref 4.0–10.5)
nRBC: 0 % (ref 0.0–0.2)

## 2020-11-05 LAB — BASIC METABOLIC PANEL
Anion gap: 10 (ref 5–15)
BUN: 19 mg/dL (ref 6–20)
CO2: 24 mmol/L (ref 22–32)
Calcium: 9.2 mg/dL (ref 8.9–10.3)
Chloride: 102 mmol/L (ref 98–111)
Creatinine, Ser: 0.86 mg/dL (ref 0.44–1.00)
GFR, Estimated: >60 mL/min (ref >60)
Glucose, Bld: 122 mg/dL — ABNORMAL HIGH (ref 70–99)
Potassium: 4.8 mmol/L (ref 3.5–5.1)
Sodium: 136 mmol/L (ref 135–145)

## 2020-11-05 MED ORDER — ENOXAPARIN SODIUM 40 MG/0.4ML IJ SOSY: 40.0000 mg | PREFILLED_SYRINGE | INTRAMUSCULAR | 0 refills | Status: DC

## 2020-11-05 MED ORDER — METHOCARBAMOL 500 MG PO TABS: 500.0000 mg | ORAL_TABLET | Freq: Four times a day (QID) | ORAL | 0 refills | Status: DC | PRN

## 2020-11-05 MED ORDER — TRAMADOL HCL 50 MG PO TABS: 50.0000 mg | ORAL_TABLET | Freq: Four times a day (QID) | ORAL | 0 refills | Status: DC | PRN

## 2020-11-05 MED ORDER — DOCUSATE SODIUM 100 MG PO CAPS: 100.0000 mg | ORAL_CAPSULE | Freq: Two times a day (BID) | ORAL | 0 refills | Status: DC

## 2020-11-05 MED ORDER — OXYCODONE HCL 5 MG PO TABS: 5.0000 mg | ORAL_TABLET | ORAL | 0 refills | Status: DC | PRN

## 2020-11-05 NOTE — TOC Progression Note (Signed)
Transition of Care [facility]) - Progression Note    Patient Details  Name: Andrea Hull MRN: [id number] Date of Birth: [birth year]  Transition of [facility]) CM/SW [employer], RN Phone Number: 11/05/2020, 2:57 PM  Clinical Narrative:      Met with the patient to discuss  DC plan and needs She has a RW at home but will need a bariatric 3 in 1, Her mother will be staying with her to help  She is set up with [facility] for [facility] She has transportation and can afford her meds      Expected Discharge Plan and Services                                                 Social Determinants of Health (SDOH) Interventions    Readmission Risk Interventions No flowsheet data found.

## 2020-11-05 NOTE — Discharge Summary (Signed)
Physician Discharge Summary  Patient ID: Andrea Hull MRN: [id number] DOB/AGE: 1975/08/07 45 y.o.  Admit date: 11/04/2020 Discharge date: 11/09/2020 Admission Diagnoses:  S/P hip replacement [Z96.649]   Discharge Diagnoses: Patient Active Problem List   Diagnosis Date Noted  . S/P hip replacement 11/04/2020  . Dysmenorrhea 03/06/2017  . Morbid obesity with body mass index (BMI) of 50.0 to 59.9 in adult (HCC) 04/15/2013  . Sleep apnea, obstructive   . Tobacco user   . Essential hypertension     Past Medical History:  Diagnosis Date  . Arthritis   . GERD (gastroesophageal reflux disease)    H/O  . Hypertension   . Morbid obesity with BMI of 50.0-59.9, adult (HCC)   . Sleep apnea, obstructive    CPAP  . Tobacco abuse      Transfusion: none   Consultants (if any):   Discharged Condition: Improved  Hospital Course: Andrea Hull is an 45 y.o. female who was admitted 11/04/2020 with a diagnosis of right hip osteoarthritis and went to the operating room on 11/04/2020 and underwent the above named procedures.    Surgeries: Procedure(s): TOTAL HIP ARTHROPLASTY ANTERIOR APPROACH APPLICATION OF CELL SAVER on 11/04/2020 Patient tolerated the surgery well. Taken to PACU where she was stabilized and then transferred to the orthopedic floor.  Started on Lovenox 40 mg q 24 hrs. Foot pumps applied bilaterally at 80 mm. Heels elevated on bed with rolled towels. No evidence of DVT. Negative Homan. Physical therapy started on day #1 for gait training and transfer. OT started day #1 for ADL and assisted devices.  Patient's foley was d/c on day #1. Patient made slow progress with PT. PT recommended discharge to SNF for continuation of PT.  On post op day # 5 patient was stable and ready for discharge to home health PT.  Implants:  Medacta Master lock 4 lateralized stem 52 Mpact TM cup and liner and L ceramic 28 mm head  She was given perioperative antibiotics:  Anti-infectives (From  admission, onward)   Start     Dose/Rate Route Frequency Ordered Stop   11/04/20 2000  ceFAZolin (ANCEF) IVPB 3g/100 mL premix        3 g 200 mL/hr over 30 Minutes Intravenous Every 6 hours 11/04/20 1938 11/05/20 0252   11/04/20 0600  ceFAZolin (ANCEF) IVPB 3g/100 mL premix  Status:  Discontinued        3 g 200 mL/hr over 30 Minutes Intravenous On call to O.R. 11/04/20 0113 11/04/20 1938    .  She was given sequential compression devices, early ambulation, and Lovenox TEDs for DVT prophylaxis.  She benefited maximally from the hospital stay and there were no complications.    Recent vital signs:  Vitals:   11/09/20 0357 11/09/20 0733  BP: 115/60 (!) 103/47  Pulse: 85 81  Resp: 20 17  Temp: 97.6 F (36.4 C) 98.3 F (36.8 C)  SpO2: 99% 97%    Recent laboratory studies:  Lab Results  Component Value Date   HGB 10.5 (L) 11/07/2020   HGB 11.5 (L) 11/06/2020   HGB 12.9 11/05/2020   Lab Results  Component Value Date   WBC 13.7 (H) 11/07/2020   PLT 216 11/07/2020   No results found for: INR Lab Results  Component Value Date   NA 136 11/05/2020   K 4.8 11/05/2020   CL 102 11/05/2020   CO2 24 11/05/2020   BUN 19 11/05/2020   CREATININE 0.86 11/05/2020   GLUCOSE 122 (H)  11/05/2020    Discharge Medications:   Allergies as of 11/09/2020      Reactions   Levofloxacin Other (See Comments)   Joint pain      Medication List    STOP taking these medications   diclofenac 75 MG EC tablet Commonly known as: VOLTAREN     TAKE these medications   acetaminophen 650 MG CR tablet Commonly known as: TYLENOL Take 1,300 mg by mouth in the morning, at noon, and at bedtime.   diphenhydrAMINE 50 MG tablet Commonly known as: BENADRYL Take 75 mg by mouth at bedtime as needed for itching.   docusate sodium 100 MG capsule Commonly known as: COLACE Take 1 capsule (100 mg total) by mouth 2 (two) times daily.   enoxaparin 40 MG/0.4ML injection Commonly known as: LOVENOX Inject  0.4 mLs (40 mg total) into the skin daily for 14 days.   lisinopril-hydrochlorothiazide 10-12.5 MG tablet Commonly known as: ZESTORETIC TAKE 1 TABLET BY MOUTH DAILY   MELATONIN PO Take 15 mg by mouth at bedtime.   methocarbamol 500 MG tablet Commonly known as: ROBAXIN Take 1 tablet (500 mg total) by mouth every 6 (six) hours as needed for muscle spasms.   oxyCODONE 5 MG immediate release tablet Commonly known as: Oxy IR/ROXICODONE Take 1-2 tablets (5-10 mg total) by mouth every 4 (four) hours as needed for moderate pain (pain score 4-6).   terbinafine 1 % cream Commonly known as: LAMISIL Apply 1 application topically as needed. YEAST INFECTION (UNDER ABDOMINAL FOLDS)   traMADol 50 MG tablet Commonly known as: ULTRAM Take 1 tablet (50 mg total) by mouth every 6 (six) hours as needed.            Durable Medical Equipment  (From admission, onward)         Start     Ordered   11/05/20 1508  DME Bedside commode  Once       Comments: bariatric  Question:  Patient needs a bedside commode to treat with the following condition  Answer:  S/P hip replacement   11/05/20 1508   11/04/20 1939  DME Walker rolling  Once       Question Answer Comment  Walker: With 5 Inch Wheels   Patient needs a walker to treat with the following condition S/P hip replacement      11/04/20 1938   11/04/20 1939  DME 3 n 1  Once        11/04/20 1938          Diagnostic Studies: DG HIP OPERATIVE UNILAT W OR W/O PELVIS RIGHT  Result Date: 11/04/2020 CLINICAL DATA:  Status post total hip arthroplasty EXAM: OPERATIVE RIGHT HIP  1 VIEW TECHNIQUE: Fluoroscopic spot image(s) were submitted for interpretation post-operatively. COMPARISON:  CT right hip September 29, 2020 FLUOROSCOPY TIME: 0 minutes 36 seconds; 9 acquired images FINDINGS: Total hip replacement noted on the right with prosthetic components well-seated on frontal view. No fracture or dislocation evident. IMPRESSION: Total hip replacement right  with prosthetic components well-seated on frontal view. No fracture or dislocation. Electronically Signed   By: Bretta Bang III M.D.   On: 11/04/2020 15:21   DG HIP UNILAT W OR W/O PELVIS 2-3 VIEWS RIGHT  Result Date: 11/04/2020 CLINICAL DATA:  Status post right total hip arthroplasty. EXAM: DG HIP (WITH OR WITHOUT PELVIS) 2-3V RIGHT COMPARISON:  None. FINDINGS: Postoperative change from right total hip arthroplasty identified. The hardware components are in anatomic alignment. No signs of periprosthetic fracture  or dislocation. Skin staples overlie the right hip. IMPRESSION: Status post right total hip arthroplasty. Electronically Signed   By: Signa Kell M.D.   On: 11/04/2020 15:01    Disposition:      Follow-up Information    Evon Slack, PA-C Follow up in 2 week(s).   Specialties: Orthopedic Surgery, Emergency Medicine Contact information: [address] [place] [address] [phone]                Signed: Patience Musca 11/09/2020, 9:33 AM

## 2020-11-05 NOTE — Evaluation (Signed)
Physical Therapy Evaluation Patient Details Name: Andrea Hull MRN: [id number] DOB: 1976-03-19 Today's Date: 11/05/2020   History of Present Illness  Andrea Hull is a 45 y.o. who presents with primary osteoarthritis of the R hip. Pt now s/p R THA (Anterior approach).    Clinical Impression  Pt upright in recliner upon arrival to room.  Pt unable to perform chair level exercises on the R LE due to the weakness.  Pt does not have the strength and also has increased pain that is preventing her from performing.  Pt is able to transfer out of chair well, with only CGA needed.  Pt then performed ambulation of 120 ft with use of bariatric FWW.  Pt has considerably slow gait speed and requires frequent rest breaks throughout ambulation of the 120 ft.  Pt transferred back to the recliner where all needs were met and she was given her call bell and chair alarm was set.  Pt will benefit from skilled PT intervention to increase independence and safety with basic mobility in preparation for discharge to the venue listed below.  Due to current restraints in not being able to ambulate with adequate gait speed, decreased R LE strength, and unsafe ability to perform stair training at this time, SNF is the most appropriate recommendation.     Follow Up Recommendations SNF    Equipment Recommendations  Rolling walker with 5" wheels;3in1 (PT)    Recommendations for Other Services       Precautions / Restrictions Precautions Precautions: Fall;Anterior Hip Restrictions Weight Bearing Restrictions: Yes RLE Weight Bearing: Weight bearing as tolerated      Mobility  Bed Mobility               General bed mobility comments: pt upright in recliner upon arrival to the room.    Transfers Overall transfer level: Needs assistance Equipment used: Rolling walker (2 wheeled) Transfers: Sit to/from Stand Sit to Stand: Min guard         General transfer comment: Verbal cuing utilized for hand  placement during STS transfer.  Ambulation/Gait Ambulation/Gait assistance: Min guard Gait Distance (Feet): 120 Feet Assistive device: Rolling walker (2 wheeled) Gait Pattern/deviations: Step-through pattern;Decreased step length - left;Decreased stance time - right;Decreased stride length;Antalgic Gait velocity: severely decreased   General Gait Details: Pt has difficulty with bending her hip durintg ambulation.  Stairs            Wheelchair Mobility    Modified Rankin (Stroke Patients Only)       Balance Overall balance assessment: Needs assistance Sitting-balance support: Feet supported;No upper extremity supported Sitting balance-Leahy Scale: Good     Standing balance support: During functional activity;Bilateral upper extremity supported Standing balance-Leahy Scale: Fair Standing balance comment: Pt does not want to lift RLE up for foot clearance.  Requires significant BUE support on RW.                             Pertinent Vitals/Pain Pain Assessment: 0-10 Pain Score: 8  Pain Location: RLE Pain Descriptors / Indicators: Grimacing;Guarding;Sore Pain Intervention(s): Limited activity within patient's tolerance;Monitored during session;Premedicated before session;Repositioned;Utilized relaxation techniques    Home Living Family/patient expects to be discharged to:: Private residence Living Arrangements: Spouse/significant other;Children (Pt states her mother also plans to come to stay with her during recovery.) Available Help at Discharge: Family;Available 24 hours/day Type of Home: House Home Access: Stairs to enter Entrance Stairs-Rails: None Entrance Stairs-Number of  Steps: 3 +1 into the home.   Home Equipment: Hand held shower head;Toilet riser;Bedside commode      Prior Function Level of Independence: Independent with assistive device(s)         Comments: Pt reports she was independent with IADL/ADL management at baseline. She uses a  single crutch on the left for functional mobility. 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     Hand Dominance   Dominant Hand: Right    Extremity/Trunk Assessment   Upper Extremity Assessment Upper Extremity Assessment: Defer to OT evaluation    Lower Extremity Assessment Lower Extremity Assessment: RLE deficits/detail RLE Deficits / Details: s/p R THA - WBAT, pain limited. RLE: Unable to fully assess due to pain    Cervical / Trunk Assessment Cervical / Trunk Assessment: Normal  Communication   Communication: No difficulties  Cognition Arousal/Alertness: Awake/alert Behavior During Therapy: WFL for tasks assessed/performed Overall Cognitive Status: Within Functional Limits for tasks assessed                                 General Comments: Pleasant, conversational, A&Ox4.      General Comments      Exercises Total Joint Exercises Ankle Circles/Pumps: AROM;Strengthening;Both;10 reps;Supine Quad Sets: AROM;Strengthening;Both;10 reps;Supine Gluteal Sets: AROM;Strengthening;Both;10 reps;Supine Long Arc Quad: AROM;Strengthening;Both;10 reps;Supine Marching in Standing: AROM;Strengthening;Both;5 reps;Supine Other Exercises Other Exercises: Pt educated on role of PT and services provided during stay in hospital.  Pt also encouraged to perform exercises and given packet to perform in between bouts of therapy.   Assessment/Plan    PT Assessment Patient needs continued PT services  PT Problem List Decreased strength;Decreased range of motion;Decreased activity tolerance;Decreased balance;Decreased mobility;Decreased knowledge of use of DME;Decreased safety awareness;Obesity;Pain       PT Treatment Interventions DME instruction;Gait training;Stair training;Functional mobility training;Therapeutic activities;Therapeutic exercise    PT Goals (Current goals can be found in the Care Plan section)  Acute Rehab PT Goals Patient Stated Goal: Decrease overall pain when  ambulating PT Goal Formulation: With patient Time For Goal Achievement: 11/19/20 Potential to Achieve Goals: Fair    Frequency BID   Barriers to discharge Inaccessible home environment Pt currently unable to ascend stairs at this moment due to pain and weakness of the R LE.    Co-evaluation               AM-PAC PT "6 Clicks" Mobility  Outcome Measure Help needed turning from your back to your side while in a flat bed without using bedrails?: A Little Help needed moving from lying on your back to sitting on the side of a flat bed without using bedrails?: A Little Help needed moving to and from a bed to a chair (including a wheelchair)?: A Little Help needed standing up from a chair using your arms (e.g., wheelchair or bedside chair)?: A Little Help needed to walk in hospital room?: A Little Help needed climbing 3-5 steps with a railing? : Total 6 Click Score: 16    End of Session Equipment Utilized During Treatment: Gait belt Activity Tolerance: Patient limited by pain Patient left: in chair;with call bell/phone within reach;with chair alarm set   PT Visit Diagnosis: Unsteadiness on feet (R26.81);Other abnormalities of gait and mobility (R26.89);Muscle weakness (generalized) (M62.81);Difficulty in walking, not elsewhere classified (R26.2);Pain Pain - Right/Left: Right Pain - part of body: Hip    Time: 1127-1208 PT Time Calculation (min) (ACUTE ONLY): 41 min   Charges:  PT Evaluation $PT Eval Low Complexity: 1 Low PT Treatments $Gait Training: 23-37 mins $Therapeutic Exercise: 8-22 mins        Gwenlyn Saran, PT, DPT 11/05/20, 3:25 PM   Christie Nottingham 11/05/2020, 3:19 PM

## 2020-11-05 NOTE — Discharge Instructions (Signed)

## 2020-11-05 NOTE — Evaluation (Signed)
Occupational Therapy Evaluation Patient Details Name: Andrea Hull MRN: [id number] DOB: [birth year] Today's Date: 11/05/2020    History of Present Illness Andrea Hull is a 45 y.o. who presents with primary osteoarthritis of the R hip. Pt now s/p R THA (Anterior approach).   Clinical Impression   Andrea Hull was seen for OT evaluation this date, POD#1 from above surgery. Pt was independent in all ADLs prior to surgery, however occasionally using a single crutch for mobility due to R hip pain. 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 Pt is eager to return to PLOF with less pain and improved safety and independence. Pt currently requires minimal assist for LB dressing as well as moderate assist for toileting and bathing while in seated position due to pain and limited AROM of R hip. Pt instructed in self care skills, falls prevention strategies, home/routines modifications, DME/AE for LB bathing and dressing tasks, and compression stocking mgt strategies. Pt would benefit from additional instruction in self care skills and techniques to help maintain precautions with or without assistive devices to support recall and carryover prior to discharge. Recommend HHOT upon discharge.      Follow Up Recommendations  Home health OT;Supervision - Intermittent    Equipment Recommendations  None recommended by OT (Pt has necessary equipment.)    Recommendations for Other Services       Precautions / Restrictions Precautions Precautions: Fall;Anterior Hip Restrictions Weight Bearing Restrictions: Yes RLE Weight Bearing: Weight bearing as tolerated      Mobility Bed Mobility Overal bed mobility: Needs Assistance Bed Mobility: Supine to Sit     Supine to sit: Mod assist     General bed mobility comments: MOD A for sup>sit. Physical assist required for mgt of RLE and trunk elevation from bed with HOB raised. Pt able to independently advance  hips toward EOB once seated upright.    Transfers Overall transfer level: Needs assistance Equipment used: Rolling walker (2 wheeled) Transfers: Sit to/from [employer] Sit to Stand: Supervision Stand pivot transfers: Supervision       General transfer comment: Min cueing for safe use of RW during functional transfers. No physical assist required.    Balance Overall balance assessment: Needs assistance Sitting-balance support: Feet supported;No upper extremity supported Sitting balance-Leahy Scale: Good     Standing balance support: During functional activity;Bilateral upper extremity supported Standing balance-Leahy Scale: Fair Standing balance comment: Tends to hop with LLE rather than weight bear through RLE. Required BUE support on RW                           ADL either performed or assessed with clinical judgement   ADL Overall ADL's : Needs assistance/impaired Eating/Feeding: Set up;Independent                       Toilet Transfer: Set up;Supervision/safety;RW;BSC;Stand-pivot   Toileting- Clothing Manipulation and Hygiene: Moderate assistance;Sit to/from stand Toileting - Clothing Manipulation Details (indicate cue type and reason): Mod A for peri-care 2/2 body habitus/pain limting ability to wipe independently. Discussed wiping aid as potential AE to support functional independence with toileting.     Functional mobility during ADLs: Rolling walker;Supervision/safety;Set up       Vision Baseline Vision/History: Wears glasses Wears Glasses: At all times Patient Visual Report: No change from baseline Additional Comments: Endorses brief dizziness when coming to sit,  but resolves quickly.     Perception     Praxis      Pertinent Vitals/Pain Pain Assessment: 0-10 Pain Score: 6  Pain Location: RLE Pain Descriptors / Indicators: Grimacing;Guarding;Sore Pain Intervention(s): Limited activity within patient's  tolerance;Monitored during session;Premedicated before session;Repositioned;Utilized relaxation techniques     Hand Dominance Right   Extremity/Trunk Assessment Upper Extremity Assessment Upper Extremity Assessment: Overall WFL for tasks assessed   Lower Extremity Assessment Lower Extremity Assessment: RLE deficits/detail;Defer to PT evaluation RLE Deficits / Details: s/p R THA - WBAT, pain limited.   Cervical / Trunk Assessment Cervical / Trunk Assessment: Normal   Communication Communication Communication: No difficulties   Cognition Arousal/Alertness: Awake/alert Behavior During Therapy: WFL for tasks assessed/performed Overall Cognitive Status: Within Functional Limits for tasks assessed                                 General Comments: Pleasant, conversational, A&Ox4.   General Comments       Exercises Other Exercises Other Exercises: Pt educated on role of OT in acute setting, safe use of AE/DME for ADL management, routines modifications to support safety and functional independence, &  falls prevention strategies for home and hospital. Other Exercises: OT facilitates bed/functional mobility, toilet transfer, & toileting with assist/education provided t/o. See ADL section for additional details.   Shoulder Instructions      Home Living Family/patient expects to be discharged to:: Private residence Living Arrangements: Spouse/significant other;Children (Pt states her mother also plans to come to stay with her during recovery.) Available Help at Discharge: Family;Available 24 hours/day Type of Home: House Home Access: Stairs to enter [employer] of Steps: 3 +1 into the home. Entrance Stairs-Rails: None       Bathroom Shower/Tub: Chief Strategy Officer: Handicapped height (toilet riser with handles.)     Home Equipment: Hand held shower head;Toilet riser;Bedside commode          Prior Functioning/Environment Level of  Independence: Independent with assistive device(s)        Comments: Pt reports she was independent with IADL/ADL management at baseline. She uses a single crutch on the left for functional mobility. 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        OT Problem List: Decreased strength;Decreased coordination;Pain;Decreased range of motion;Decreased activity tolerance;Decreased safety awareness;Impaired balance (sitting and/or standing);Decreased knowledge of use of DME or AE;Decreased knowledge of precautions      OT Treatment/Interventions: Self-care/ADL training;Therapeutic exercise;Therapeutic activities;DME and/or AE instruction;Patient/family education;Balance training    OT Goals(Current goals can be found in the care plan section) Acute Rehab OT Goals Patient Stated Goal: To have less pain with mobility OT Goal Formulation: With patient Time For Goal Achievement: 11/19/20 Potential to Achieve Goals: Good ADL Goals Pt Will Perform Grooming: with set-up;with supervision Pt Will Perform Lower Body Dressing: sit to/from stand;with modified independence;with adaptive equipment Pt Will Transfer to Toilet: ambulating;bedside commode;with modified independence Pt Will Perform Toileting - Clothing Manipulation and hygiene: sit to/from stand;with modified independence;with adaptive equipment  OT Frequency: Min 2X/week   Barriers to D/C:            Co-evaluation              AM-PAC OT "6 Clicks" Daily Activity     Outcome Measure Help from another person eating meals?: None Help from another person taking care of personal grooming?: A Little Help from another person toileting, which includes  using toliet, bedpan, or urinal?: A Lot Help from another person bathing (including washing, rinsing, drying)?: A Little Help from another person to put on and taking off regular upper body clothing?: A Little Help from another person to put on and taking off regular lower body clothing?: A Little 6  Click Score: 18   End of Session Equipment Utilized During Treatment: Gait belt;Rolling walker  Activity Tolerance: Patient tolerated treatment well Patient left: in chair;with call bell/phone within reach;with chair alarm set;with SCD's reapplied  OT Visit Diagnosis: Other abnormalities of gait and mobility (R26.89);Pain Pain - Right/Left: Right Pain - part of body: Hip                Time: 8182-9937 OT Time Calculation (min): 50 min Charges:  OT General Charges $OT Visit: 1 Visit OT Evaluation $OT Eval Moderate Complexity: 1 Mod OT Treatments $Self Care/Home Management : 23-37 mins  Rockney Ghee, M.S., OTR/L Ascom: [phone] 11/05/20, 10:59 AM

## 2020-11-05 NOTE — Progress Notes (Signed)
Physical Therapy Treatment Patient Details Name: Andrea Hull MRN: [id number] DOB: [birth year] Today's Date: 11/05/2020    History of Present Illness Andrea Hull is a 45 y.o. who presents with primary osteoarthritis of the R hip. Pt now s/p R THA (Anterior approach).    PT Comments    Pt received seated in recliner upon arrival to room.  Pt agreeable to therapy.  Pt noted to be in decreased pain as coordinated with nursing for pain medicine to be given prior to therapy.  Pt notes decreased pain at rest, but no difference in pain upon standing, however notes 9/10 pain compared to 8/10 pain earlier today.  Pt able to transfer to standing from recliner with good technique.  Pt able to ambulate the entire bout of nursing station with frequent rest breaks.  HR was elevated to 141 at peak and pt took standing rest break.  HR decreased to 121 prior to starting exercise.  Pt was asymptomatic of any other cardiovascular issues, and self-reported that she was feeling fine, just hot.  Pt continued to ambulate back to room and requested assistance back to her bed.  Pt needed modA for lifting RLE into the bed.  Pt encouraged to perform exercises from packet and was able to perform, but still has considerable weakness in the RLE.  Ambulation is slow, yet safe, and if she is able to ambulate up/down stairs, recommendation is HHPT.  If unable to perform due to pain or weakness, current discharge plans to SNF, will remain appropriate at this time.  Pt will continue to benefit from skilled therapy in order to address deficits listed below.   Follow Up Recommendations  SNF     Equipment Recommendations  Rolling walker with 5" wheels;3in1 (PT)    Recommendations for Other Services       Precautions / Restrictions Precautions Precautions: Fall;Anterior Hip Restrictions Weight Bearing Restrictions: Yes RLE Weight Bearing: Weight bearing as tolerated    Mobility  Bed Mobility                General bed mobility comments: pt upright in recliner upon arrival to the room.    Transfers Overall transfer level: Needs assistance Equipment used: Rolling walker (2 wheeled) Transfers: Sit to/from Stand Sit to Stand: Min guard         General transfer comment: Verbal cuing utilized for hand placement during STS transfer.  Ambulation/Gait Ambulation/Gait assistance: Min guard Gait Distance (Feet): 200 Feet Assistive device: Rolling walker (2 wheeled) Gait Pattern/deviations: Step-through pattern;Decreased step length - left;Decreased stance time - right;Decreased stride length;Antalgic Gait velocity: severely decreased   General Gait Details: Pt has difficulty with bending her hip during ambulation.   Stairs             Wheelchair Mobility    Modified Rankin (Stroke Patients Only)       Balance Overall balance assessment: Needs assistance Sitting-balance support: Feet supported;No upper extremity supported Sitting balance-Leahy Scale: Good     Standing balance support: During functional activity;Bilateral upper extremity supported Standing balance-Leahy Scale: Fair Standing balance comment: Pt does not want to lift RLE up for foot clearance.  Requires significant BUE support on RW.                            Cognition Arousal/Alertness: Awake/alert Behavior During Therapy: WFL for tasks assessed/performed Overall Cognitive Status: Within Functional Limits for tasks assessed  General Comments: Pleasant, conversational, A&Ox4.      Exercises Total Joint Exercises Ankle Circles/Pumps: AROM;Strengthening;Both;10 reps;Supine Quad Sets: AROM;Strengthening;Both;10 reps;Supine Gluteal Sets: AROM;Strengthening;Both;10 reps;Supine Long Arc Quad: AROM;Strengthening;Both;10 reps;Supine Marching in Standing: AROM;Strengthening;Both;5 reps;Supine Other Exercises Other Exercises: Pt educated on role of PT  and services provided during stay in hospital.  Pt also encouraged to perform exercises and given packet to perform in between bouts of therapy.    General Comments        Pertinent Vitals/Pain Pain Assessment: 0-10 Pain Score: 9  Pain Location: RLE during lift off phase of gait pattern Pain Descriptors / Indicators: Grimacing;Guarding;Sore Pain Intervention(s): Limited activity within patient's tolerance;Monitored during session;Premedicated before session;Repositioned;Utilized relaxation techniques    Home Living Family/patient expects to be discharged to:: Private residence Living Arrangements: Spouse/significant other;Children (Pt states her mother also plans to come to stay with her during recovery.) Available Help at Discharge: Family;Available 24 hours/day Type of Home: House Home Access: Stairs to enter Entrance Stairs-Rails: None   Home Equipment: Hand held shower head;Toilet riser;Bedside commode      Prior Function Level of Independence: Independent with assistive device(s)      Comments: Pt reports she was independent with IADL/ADL management at baseline. She uses a single crutch on the left for functional mobility. +driving. [facility] for 17 years.   PT Goals (current goals can now be found in the care plan section) Acute Rehab PT Goals Patient Stated Goal: Decrease overall pain when ambulating PT Goal Formulation: With patient Time For Goal Achievement: 11/19/20 Potential to Achieve Goals: Fair Progress towards PT goals: Progressing toward goals    Frequency    BID      PT Plan      Co-evaluation              AM-PAC PT "6 Clicks" Mobility   Outcome Measure  Help needed turning from your back to your side while in a flat bed without using bedrails?: A Little Help needed moving from lying on your back to sitting on the side of a flat bed without using bedrails?: A Little Help needed moving to and from a bed to a chair (including a wheelchair)?: A  Little Help needed standing up from a chair using your arms (e.g., wheelchair or bedside chair)?: A Little Help needed to walk in hospital room?: A Little Help needed climbing 3-5 steps with a railing? : Total 6 Click Score: 16    End of Session Equipment Utilized During Treatment: Gait belt Activity Tolerance: Patient limited by pain Patient left: in chair;with call bell/phone within reach;with chair alarm set   PT Visit Diagnosis: Unsteadiness on feet (R26.81);Other abnormalities of gait and mobility (R26.89);Muscle weakness (generalized) (M62.81);Difficulty in walking, not elsewhere classified (R26.2);Pain Pain - Right/Left: Right Pain - part of body: Hip     Time: 7425-9563 PT Time Calculation (min) (ACUTE ONLY): 54 min  Charges:  $Gait Training: 53-67 mins $Therapeutic Exercise: 8-22 mins                     Nolon Bussing, PT, DPT 11/05/20, 3:38 PM    Phineas Real 11/05/2020, 3:32 PM

## 2020-11-05 NOTE — Progress Notes (Signed)
   Subjective: 1 Day Post-Op Procedure(s) (LRB): TOTAL HIP ARTHROPLASTY ANTERIOR APPROACH (Right) APPLICATION OF CELL SAVER (N/A) Patient reports pain as moderate.   Patient is well, and has had no acute complaints or problems Denies any CP, SOB, ABD pain. We will continue therapy today.  Plan is to go Home after hospital stay.  Objective: Vital signs in last 24 hours: Temp:  [97.3 F (36.3 C)-98.6 F (37 C)] 98.3 F (36.8 C) (05/20 0753) Pulse Rate:  [74-116] 74 (05/20 0753) Resp:  [11-26] 18 (05/20 0753) BP: (104-138)/(51-88) 110/60 (05/20 0753) SpO2:  [91 %-100 %] 98 % (05/20 0753)  Intake/Output from previous day: 05/19 0701 - 05/20 0700 In: 1553.7 [I.V.:1053.7; Blood:250; IV Piggyback:250] Out: 950 [Urine:350; Blood:600] Intake/Output this shift: No intake/output data recorded.  Recent Labs    11/04/20 2025 11/05/20 0459  HGB 13.9 12.9   Recent Labs    11/04/20 2025 11/05/20 0459  WBC 25.4* 20.3*  RBC 4.27 3.94  HCT 40.6 37.7  PLT 280 272   Recent Labs    11/04/20 2025 11/05/20 0459  NA  --  136  K  --  4.8  CL  --  102  CO2  --  24  BUN  --  19  CREATININE 0.93 0.86  GLUCOSE  --  122*  CALCIUM  --  9.2   No results for input(s): LABPT, INR in the last 72 hours.  EXAM General - Patient is Alert, Appropriate and Oriented Extremity - Neurovascular intact Sensation intact distally Intact pulses distally Dorsiflexion/Plantar flexion intact No cellulitis present Compartment soft Dressing - dressing C/D/I and no drainage, prevena intact with out drainage Motor Function - intact, moving foot and toes well on exam.   Past Medical History:  Diagnosis Date  . Arthritis   . GERD (gastroesophageal reflux disease)    H/O  . Hypertension   . Morbid obesity with BMI of 50.0-59.9, adult (HCC)   . Sleep apnea, obstructive    CPAP  . Tobacco abuse     Assessment/Plan:   1 Day Post-Op Procedure(s) (LRB): TOTAL HIP ARTHROPLASTY ANTERIOR APPROACH  (Right) APPLICATION OF CELL SAVER (N/A) Active Problems:   S/P hip replacement  Estimated body mass index is 52.03 kg/m as calculated from the following:   Height as of 10/25/20: 5' 5.5" (1.664 m).   Weight as of 10/25/20: 144 kg. Advance diet Up with therapy  Work on [employer] and VSS Pain controlled CM to assist with discharge   DVT Prophylaxis - Lovenox, TED hose and SCDs Weight-Bearing as tolerated to right leg   T. Cranston Neighbor, PA-C [facility] 11/05/2020, 8:08 AM

## 2020-11-06 LAB — CBC
HCT: 33.4 % — ABNORMAL LOW (ref 36.0–46.0)
Hemoglobin: 11.5 g/dL — ABNORMAL LOW (ref 12.0–15.0)
MCH: 32.8 pg (ref 26.0–34.0)
MCHC: 34.4 g/dL (ref 30.0–36.0)
MCV: 95.2 fL (ref 80.0–100.0)
Platelets: 218 10*3/uL (ref 150–400)
RBC: 3.51 MIL/uL — ABNORMAL LOW (ref 3.87–5.11)
RDW: 12.3 % (ref 11.5–15.5)
WBC: 16.5 10*3/uL — ABNORMAL HIGH (ref 4.0–10.5)
nRBC: 0 % (ref 0.0–0.2)

## 2020-11-06 NOTE — Progress Notes (Signed)
Physical Therapy Treatment Patient Details Name: Andrea Hull MRN: [id number] DOB: Aug 14, 1975 Today's Date: 11/06/2020    History of Present Illness Shalanda L Bacot is a 45 y.o. who presents with primary osteoarthritis of the R hip. Pt now s/p R THA (Anterior approach).    PT Comments    Pt was long sitting in bed upon arriving. Spouse present throughout session. She reports more severe pain this date than previous but agrees to session and was pre-medicated prior. Mod assist + increased time to exit bed. Does have assistance at home however therapist concern if its 24/7. She stood from elevated bed height. Unable from standard height/lower surface height. Ambulated 120 ft with extremely slow step to/ antalgic pattern. Needs a lot standing rest breaks. HR elevated to 147 bpm and pt profusely sweating. No LOB throughout gait training.  Returned to room and was repositioned in recliner post session with lunch tray placed in front of her and ice pack applied. Lengthy discussion about DC disposition and DC recs. Highly recommend DC to SNF to receive more PT prior to going home with family. Will return later today for PM session.    Follow Up Recommendations  SNF     Equipment Recommendations  Other (comment) (Bariatric RW and BSC if DCing home. defer to rehab if pt DCs to SNF)       Precautions / Restrictions Precautions Precautions: Fall;Anterior Hip Precaution Booklet Issued: No Precaution Comments: reviewed anterior hip precautions Restrictions Weight Bearing Restrictions: Yes RLE Weight Bearing: Weight bearing as tolerated    Mobility  Bed Mobility Overal bed mobility: Needs Assistance Bed Mobility: Supine to Sit     Supine to sit: Mod assist     General bed mobility comments: MOD assist to exit R side of bed. Assisted LE to EOB. mostly pain limited however does have strength deficits as well    Transfers Overall transfer level: Needs assistance Equipment used: Rolling  walker (2 wheeled) Transfers: Sit to/from Stand Sit to Stand: From elevated surface;Min guard         General transfer comment: CGA for safety to stand form elevated bed height. unable to stand from standard/lower bed height  Ambulation/Gait Ambulation/Gait assistance: Min guard;Supervision Gait Distance (Feet): 120 Feet Assistive device: Rolling walker (2 wheeled) Gait Pattern/deviations: Step-to pattern;Antalgic Gait velocity: severely decreased   General Gait Details: Pt ambulated extremely slowly. She has HR elevation to 147 bpm and needs alot of static standing rest. CGA at first progressing to supervision. Poor clearance during swing phase. once fatigues, slides RLE on floor during       Balance Overall balance assessment: Needs assistance Sitting-balance support: Feet supported;No upper extremity supported Sitting balance-Leahy Scale: Good     Standing balance support: During functional activity;Bilateral upper extremity supported Standing balance-Leahy Scale: Fair Standing balance comment: no LOB however reliant on RW /BUE support during dynamic activity       Cognition Arousal/Alertness: Awake/alert Behavior During Therapy: WFL for tasks assessed/performed Overall Cognitive Status: Within Functional Limits for tasks assessed    General Comments: Pleasant, conversational, A&Ox4. did need to re-educated on hip precautions             Pertinent Vitals/Pain Pain Assessment: 0-10 Pain Score: 9  Pain Location: RLE during lift off phase of gait pattern Pain Descriptors / Indicators: Grimacing;Guarding;Sore Pain Intervention(s): Limited activity within patient's tolerance;Monitored during session;Premedicated before session;Repositioned;Ice applied           PT Goals (current goals can now be found  in the care plan section) Acute Rehab PT Goals Patient Stated Goal: Decrease overall pain Progress towards PT goals: Progressing toward goals    Frequency     BID      PT Plan Current plan remains appropriate       AM-PAC PT "6 Clicks" Mobility   Outcome Measure  Help needed turning from your back to your side while in a flat bed without using bedrails?: A Little Help needed moving from lying on your back to sitting on the side of a flat bed without using bedrails?: A Lot Help needed moving to and from a bed to a chair (including a wheelchair)?: A Little Help needed standing up from a chair using your arms (e.g., wheelchair or bedside chair)?: A Little Help needed to walk in hospital room?: A Little Help needed climbing 3-5 steps with a railing? : A Lot 6 Click Score: 16    End of Session Equipment Utilized During Treatment: Gait belt Activity Tolerance: Patient limited by pain;Patient limited by fatigue Patient left: in chair;with call bell/phone within reach;with chair alarm set Nurse Communication: Mobility status PT Visit Diagnosis: Unsteadiness on feet (R26.81);Other abnormalities of gait and mobility (R26.89);Muscle weakness (generalized) (M62.81);Difficulty in walking, not elsewhere classified (R26.2);Pain Pain - Right/Left: Right Pain - part of body: Hip     Time: 1123-1200 PT Time Calculation (min) (ACUTE ONLY): 37 min  Charges:  $Gait Training: 23-37 mins                     Jetta Lout PTA 11/06/20, 12:53 PM

## 2020-11-06 NOTE — Progress Notes (Addendum)
Physical Therapy Treatment Patient Details Name: Andrea Hull MRN: [id number] DOB: April 24, 1976 Today's Date: 11/06/2020    History of Present Illness Andrea Hull is a 45 y.o. who presents with primary osteoarthritis of the R hip. Pt now s/p R THA (Anterior approach).    PT Comments    Pt was sitting in recliner upon arriving. She agrees to session and is cooperative and pleasant throughout. Requested to urinate. Stood with CGA and ambulated ~ 5 steps to [facility] with antalgic step to pattern. Stood from [facility] and took steps to EOB with CGA. Mod assist required to return to long sitting in bed. Once back I bed , did perform there ex. See exercises listed below. Pt is progressing well but will benefit from SNF to improve independence with ADLs.     Follow Up Recommendations  SNF     Equipment Recommendations  Other (comment) (defer to next level of care)       Precautions / Restrictions Precautions Precautions: Fall;Anterior Hip Precaution Booklet Issued: No Precaution Comments: reviewed anterior hip precautions Restrictions Weight Bearing Restrictions: Yes RLE Weight Bearing: Weight bearing as tolerated    Mobility  Bed Mobility Overal bed mobility: Needs Assistance Bed Mobility: Sit to Supine     Supine to sit: Mod assist     General bed mobility comments: mod assist to progress LEs into bed    Transfers Overall transfer level: Needs assistance Equipment used: Rolling walker (2 wheeled) Transfers: Sit to/from Stand Sit to Stand: From elevated surface;Min guard         General transfer comment: CGA to stand from recliner and BSC. vcs for imporved technique  Ambulation/Gait Ambulation/Gait assistance: Min guard Gait Distance (Feet): 5 Feet Assistive device: Rolling walker (2 wheeled) Gait Pattern/deviations: Step-to pattern;Antalgic Gait velocity: severely decreased   General Gait Details: pt ambulated from recliner to [facility] then BSC to EOB. continues to have  extremely slow cadnece and poor RLE foot clearance during gait       Balance Overall balance assessment: Needs assistance Sitting-balance support: Feet supported;No upper extremity supported Sitting balance-Leahy Scale: Good     Standing balance support: During functional activity;Bilateral upper extremity supported Standing balance-Leahy Scale: Fair Standing balance comment: no LOB however reliant on RW /BUE support during dynamic activity       Cognition Arousal/Alertness: Awake/alert Behavior During Therapy: WFL for tasks assessed/performed Overall Cognitive Status: Within Functional Limits for tasks assessed      General Comments: Pleasant, conversational, A&Ox4. did need to re-educated on hip precautions             Pertinent Vitals/Pain Pain Assessment: 0-10 Pain Score: 7  Pain Location: RLE during lift off phase of gait pattern Pain Descriptors / Indicators: Grimacing;Guarding;Sore Pain Intervention(s): Limited activity within patient's tolerance;Monitored during session;Premedicated before session;Repositioned;Ice applied           PT Goals (current goals can now be found in the care plan section) Acute Rehab PT Goals Patient Stated Goal: Decrease overall pain Progress towards PT goals: Progressing toward goals    Frequency    BID      PT Plan Current plan remains appropriate       AM-PAC PT "6 Clicks" Mobility   Outcome Measure  Help needed turning from your back to your side while in a flat bed without using bedrails?: A Little Help needed moving from lying on your back to sitting on the side of a flat bed without using bedrails?: A Lot Help needed  moving to and from a bed to a chair (including a wheelchair)?: A Little Help needed standing up from a chair using your arms (e.g., wheelchair or bedside chair)?: A Little Help needed to walk in hospital room?: A Little Help needed climbing 3-5 steps with a railing? : A Lot 6 Click Score: 16     End of Session Equipment Utilized During Treatment: Gait belt Activity Tolerance: Patient limited by fatigue;Patient limited by pain Patient left: in chair;with call bell/phone within reach;with chair alarm set Nurse Communication: Mobility status PT Visit Diagnosis: Unsteadiness on feet (R26.81);Other abnormalities of gait and mobility (R26.89);Muscle weakness (generalized) (M62.81);Difficulty in walking, not elsewhere classified (R26.2);Pain Pain - Right/Left: Right Pain - part of body: Hip     Time: 1540-1605 PT Time Calculation (min) (ACUTE ONLY): 25 min  Charges:  $Gait Training: 23-37 mins $Therapeutic Exercise: 8-22 mins $Therapeutic Activity: 8-22 mins                     Jetta Lout PTA 11/06/20, 4:20 PM

## 2020-11-06 NOTE — Progress Notes (Signed)
Subjective: 2 Days Post-Op Procedure(s) (LRB): TOTAL HIP ARTHROPLASTY ANTERIOR APPROACH (Right) APPLICATION OF CELL SAVER (N/A) Patient reports pain as moderate.   Patient is well, and has had no acute complaints or problems Denies any CP, SOB, ABD pain. We will continue therapy today.  Plan is to go Home after hospital stay however current recommending is for SNF.  Objective: Vital signs in last 24 hours: Temp:  [97.8 F (36.6 C)-98.9 F (37.2 C)] 98 F (36.7 C) (05/21 0821) Pulse Rate:  [67-141] 92 (05/21 0821) Resp:  [14-18] 14 (05/21 0821) BP: (98-123)/(45-61) 115/45 (05/21 0821) SpO2:  [93 %-99 %] 97 % (05/21 0821)  Intake/Output from previous day: 05/20 0701 - 05/21 0700 In: 469.6 [P.O.:240; I.V.:229.6] Out: 0  Intake/Output this shift: No intake/output data recorded.  Recent Labs    11/04/20 2025 11/05/20 0459 11/06/20 0534  HGB 13.9 12.9 11.5*   Recent Labs    11/05/20 0459 11/06/20 0534  WBC 20.3* 16.5*  RBC 3.94 3.51*  HCT 37.7 33.4*  PLT 272 218   Recent Labs    11/04/20 2025 11/05/20 0459  NA  --  136  K  --  4.8  CL  --  102  CO2  --  24  BUN  --  19  CREATININE 0.93 0.86  GLUCOSE  --  122*  CALCIUM  --  9.2   No results for input(s): LABPT, INR in the last 72 hours.  EXAM General - Patient is Alert, Appropriate and Oriented Extremity - Neurovascular intact Sensation intact distally Intact pulses distally Dorsiflexion/Plantar flexion intact No cellulitis present Compartment soft  Right thigh is soft to palpation, mild swelling. Dressing - dressing C/D/I and no drainage, prevena intact with out drainage Motor Function - intact, moving foot and toes well on exam.   Past Medical History:  Diagnosis Date  . Arthritis   . GERD (gastroesophageal reflux disease)    H/O  . Hypertension   . Morbid obesity with BMI of 50.0-59.9, adult (HCC)   . Sleep apnea, obstructive    CPAP  . Tobacco abuse     Assessment/Plan:   2 Days Post-Op  Procedure(s) (LRB): TOTAL HIP ARTHROPLASTY ANTERIOR APPROACH (Right) APPLICATION OF CELL SAVER (N/A) Active Problems:   S/P hip replacement  Estimated body mass index is 52.03 kg/m as calculated from the following:   Height as of 10/25/20: 5' 5.5" (1.664 m).   Weight as of 10/25/20: 144 kg. Advance diet Up with therapy   Work on BM, patient is passing gas. Labs and VSS, WBC is trending down to 16.5. Pain controlled, reports increased pain following PT yesterday. CM to assist with discharge, will see how she does with PT today and determine possible discharge location. If she significantly improves, possible d/c home tomorrow with HHPT.  DVT Prophylaxis - Lovenox, TED hose and SCDs Weight-Bearing as tolerated to right leg  J. Horris Latino, PA-C [facility] 11/06/2020, 9:29 AM

## 2020-11-06 NOTE — Progress Notes (Signed)
No acute events overnight, pain control on oral analgesics. Ambulated with roller walker to bedside commode and assisted back to bed. Nil drainage to post op site. Vtal signs remain stable. Observation continues.

## 2020-11-06 NOTE — Plan of Care (Signed)
  Problem: Health Behavior/Discharge Planning: Goal: Ability to manage health-related needs will improve Outcome: Progressing   

## 2020-11-07 LAB — CBC
HCT: 30.9 % — ABNORMAL LOW (ref 36.0–46.0)
Hemoglobin: 10.5 g/dL — ABNORMAL LOW (ref 12.0–15.0)
MCH: 32.6 pg (ref 26.0–34.0)
MCHC: 34 g/dL (ref 30.0–36.0)
MCV: 96 fL (ref 80.0–100.0)
Platelets: 216 10*3/uL (ref 150–400)
RBC: 3.22 MIL/uL — ABNORMAL LOW (ref 3.87–5.11)
RDW: 12.1 % (ref 11.5–15.5)
WBC: 13.7 10*3/uL — ABNORMAL HIGH (ref 4.0–10.5)
nRBC: 0 % (ref 0.0–0.2)

## 2020-11-07 MED ORDER — FLEET ENEMA 7-19 GM/118ML RE ENEM: 1.0000 | ENEMA | Freq: Every day | RECTAL | Status: DC | PRN

## 2020-11-07 NOTE — Progress Notes (Signed)
Subjective: 3 Days Post-Op Procedure(s) (LRB): TOTAL HIP ARTHROPLASTY ANTERIOR APPROACH (Right) APPLICATION OF CELL SAVER (N/A) Patient reports pain as moderate but does feel that she is able to increase her movement this AM.   Patient is well, and has had no acute complaints or problems Denies any CP, SOB, ABD pain. We will continue therapy today.  Plan is to go to SNF pending progress with PT.  Objective: Vital signs in last 24 hours: Temp:  [98 F (36.7 C)-98.6 F (37 C)] 98 F (36.7 C) (05/22 0533) Pulse Rate:  [89-94] 89 (05/22 0533) Resp:  [16-18] 18 (05/22 0533) BP: (111-117)/(55-59) 117/56 (05/22 0533) SpO2:  [96 %-98 %] 97 % (05/22 0533)  Intake/Output from previous day: 05/21 0701 - 05/22 0700 In: 480 [P.O.:480] Out: 0  Intake/Output this shift: No intake/output data recorded.  Recent Labs    11/04/20 2025 11/05/20 0459 11/06/20 0534 11/07/20 0520  HGB 13.9 12.9 11.5* 10.5*   Recent Labs    11/06/20 0534 11/07/20 0520  WBC 16.5* 13.7*  RBC 3.51* 3.22*  HCT 33.4* 30.9*  PLT 218 216   Recent Labs    11/04/20 2025 11/05/20 0459  NA  --  136  K  --  4.8  CL  --  102  CO2  --  24  BUN  --  19  CREATININE 0.93 0.86  GLUCOSE  --  122*  CALCIUM  --  9.2   No results for input(s): LABPT, INR in the last 72 hours.  EXAM General - Patient is Alert, Appropriate and Oriented Extremity - Neurovascular intact Sensation intact distally Intact pulses distally Dorsiflexion/Plantar flexion intact No cellulitis present Compartment soft  Right thigh is soft to palpation, mild swelling. Dressing - dressing C/D/I and no drainage, prevena intact with out drainage Motor Function - intact, moving foot and toes well on exam.   Past Medical History:  Diagnosis Date  . Arthritis   . GERD (gastroesophageal reflux disease)    H/O  . Hypertension   . Morbid obesity with BMI of 50.0-59.9, adult (HCC)   . Sleep apnea, obstructive    CPAP  . Tobacco abuse      Assessment/Plan:   3 Days Post-Op Procedure(s) (LRB): TOTAL HIP ARTHROPLASTY ANTERIOR APPROACH (Right) APPLICATION OF CELL SAVER (N/A) Active Problems:   S/P hip replacement  Estimated body mass index is 52.03 kg/m as calculated from the following:   Height as of 10/25/20: 5' 5.5" (1.664 m).   Weight as of 10/25/20: 144 kg. Advance diet Up with therapy   Work on BM, patient is passing gas. Labs and VSS, WBC is trending down to 13.7. Pain controlled, reports increased pain following PT yesterday but denies any significant pain this AM. CM to assist with discharge, will see how she does with PT today and determine possible discharge location. Current plan is for d/c to SNF tomorrow pending progress with PT.  DVT Prophylaxis - Lovenox, TED hose and SCDs Weight-Bearing as tolerated to right leg  J. Horris Latino, PA-C [facility] 11/07/2020, 9:00 AM

## 2020-11-07 NOTE — Progress Notes (Signed)
Physical Therapy Treatment Patient Details Name: Andrea Hull MRN: [id number] DOB: 13-Nov-1975 Today's Date: 11/07/2020    History of Present Illness Andrea Hull is a 45 y.o. who presents with primary osteoarthritis of the R hip. Pt now s/p R THA (Anterior approach).    PT Comments    Ready for session.  She is able to stand and walk 65' with a slow steady gait pattern with occasional self initiated rest breaks.  Gait is challenging for pt with excessive weight bearing on walker.  HR 137 at end of gait.  She is steady however with no LOB or buckling but does get very red in the face with exertion.  Time spent at end of session with standing activities including weight shifting and LLE AROM.  Fatigued with effort.    SNF remains appropriate for discharge.   Follow Up Recommendations  SNF     Equipment Recommendations       Recommendations for Other Services       Precautions / Restrictions Precautions Precautions: Fall;Anterior Hip Precaution Booklet Issued: No Precaution Comments: reviewed anterior hip precautions Restrictions Weight Bearing Restrictions: Yes RLE Weight Bearing: Weight bearing as tolerated    Mobility  Bed Mobility               General bed mobility comments: in recliner before and after    Transfers Overall transfer level: Needs assistance Equipment used: Rolling walker (2 wheeled) Transfers: Sit to/from Stand Sit to Stand: Min guard            Ambulation/Gait Ambulation/Gait assistance: Min guard Gait Distance (Feet): 60 Feet Assistive device: Rolling walker (2 wheeled) Gait Pattern/deviations: Step-to pattern;Antalgic Gait velocity: decreased       Stairs             Wheelchair Mobility    Modified Rankin (Stroke Patients Only)       Balance Overall balance assessment: Needs assistance Sitting-balance support: Feet supported;No upper extremity supported Sitting balance-Leahy Scale: Good     Standing  balance support: During functional activity;Bilateral upper extremity supported Standing balance-Leahy Scale: Fair Standing balance comment: no LOB however reliant on RW /BUE support during dynamic activity                            Cognition Arousal/Alertness: Awake/alert Behavior During Therapy: WFL for tasks assessed/performed Overall Cognitive Status: Within Functional Limits for tasks assessed                                        Exercises Other Exercises Other Exercises: standing AROM and weight shifting activities in standing.    General Comments        Pertinent Vitals/Pain Pain Assessment: Faces Faces Pain Scale: Hurts even more Pain Location: RLE during lift off phase of gait pattern Pain Descriptors / Indicators: Grimacing;Guarding;Sore Pain Intervention(s): Limited activity within patient's tolerance;Monitored during session;Premedicated before session;Repositioned;Ice applied    Home Living                      Prior Function            PT Goals (current goals can now be found in the care plan section) Progress towards PT goals: Progressing toward goals    Frequency    BID      PT Plan Current plan remains  appropriate    Co-evaluation              AM-PAC PT "6 Clicks" Mobility   Outcome Measure  Help needed turning from your back to your side while in a flat bed without using bedrails?: A Little Help needed moving from lying on your back to sitting on the side of a flat bed without using bedrails?: A Lot Help needed moving to and from a bed to a chair (including a wheelchair)?: A Little Help needed standing up from a chair using your arms (e.g., wheelchair or bedside chair)?: A Little Help needed to walk in hospital room?: A Little Help needed climbing 3-5 steps with a railing? : A Lot 6 Click Score: 16    End of Session Equipment Utilized During Treatment: Gait belt Activity Tolerance: Patient  limited by fatigue;Patient limited by pain Patient left: in chair;with call bell/phone within reach;with chair alarm set Nurse Communication: Mobility status PT Visit Diagnosis: Unsteadiness on feet (R26.81);Other abnormalities of gait and mobility (R26.89);Muscle weakness (generalized) (M62.81);Difficulty in walking, not elsewhere classified (R26.2);Pain Pain - Right/Left: Right Pain - part of body: Hip     Time: 4098-1191 PT Time Calculation (min) (ACUTE ONLY): 31 min  Charges:  $Gait Training: 23-37 mins                    Danielle Dess, PTA 11/07/20, 1:36 PM

## 2020-11-08 LAB — SURGICAL PATHOLOGY

## 2020-11-08 NOTE — Progress Notes (Signed)
Physical Therapy Treatment Patient Details Name: Andrea Hull MRN: [id number] DOB: 28-May-1976 Today's Date: 11/08/2020    History of Present Illness Andrea Hull is a 45 y.o. who presents with primary osteoarthritis of the R hip. Pt now s/p R THA (Anterior approach).    PT Comments    Reports doing HEP earlier.  To EOB on her own awaiting assist to commode.  Assisted as needed and then she is able to progress gait 100' with RW and improved gait and speed this am.  Will address stairs this pm in hopes of updating discharge disposition to HHPT   Follow Up Recommendations  SNF     Equipment Recommendations  Rolling walker with 5" wheels;3in1 (PT) (bariatric)    Recommendations for Other Services       Precautions / Restrictions Precautions Precautions: Fall;Anterior Hip Precaution Booklet Issued: No Precaution Comments: reviewed anterior hip precautions Restrictions Weight Bearing Restrictions: Yes RLE Weight Bearing: Weight bearing as tolerated    Mobility  Bed Mobility               General bed mobility comments: in recliner before and after    Transfers Overall transfer level: Needs assistance Equipment used: Rolling walker (2 wheeled) Transfers: Sit to/from Stand Sit to Stand: Supervision            Ambulation/Gait Ambulation/Gait assistance: Min guard Gait Distance (Feet): 100 Feet Assistive device: Rolling walker (2 wheeled) Gait Pattern/deviations: Step-to pattern;Antalgic Gait velocity: decreased but improved from yesterday       Stairs             Wheelchair Mobility    Modified Rankin (Stroke Patients Only)       Balance Overall balance assessment: Needs assistance Sitting-balance support: Feet supported;No upper extremity supported Sitting balance-Leahy Scale: Good     Standing balance support: During functional activity;Bilateral upper extremity supported Standing balance-Leahy Scale: Fair Standing balance  comment: no LOB however reliant on RW /BUE support during dynamic activity                            Cognition Arousal/Alertness: Awake/alert Behavior During Therapy: WFL for tasks assessed/performed Overall Cognitive Status: Within Functional Limits for tasks assessed                                        Exercises Other Exercises Other Exercises: reports doign HEP earlier today    General Comments        Pertinent Vitals/Pain Pain Assessment: Faces Faces Pain Scale: Hurts a little bit Pain Descriptors / Indicators: Grimacing;Guarding;Sore Pain Intervention(s): Limited activity within patient's tolerance;Monitored during session;Repositioned    Home Living                      Prior Function            PT Goals (current goals can now be found in the care plan section) Progress towards PT goals: Progressing toward goals    Frequency    BID      PT Plan Current plan remains appropriate    Co-evaluation              AM-PAC PT "6 Clicks" Mobility   Outcome Measure  Help needed turning from your back to your side while in a flat bed without using bedrails?: A Little Help  needed moving from lying on your back to sitting on the side of a flat bed without using bedrails?: A Little Help needed moving to and from a bed to a chair (including a wheelchair)?: A Little Help needed standing up from a chair using your arms (e.g., wheelchair or bedside chair)?: A Little Help needed to walk in hospital room?: A Little Help needed climbing 3-5 steps with a railing? : A Little 6 Click Score: 18    End of Session Equipment Utilized During Treatment: Gait belt Activity Tolerance: Patient tolerated treatment well Patient left: in chair;with call bell/phone within reach;with chair alarm set Nurse Communication: Mobility status PT Visit Diagnosis: Unsteadiness on feet (R26.81);Other abnormalities of gait and mobility (R26.89);Muscle  weakness (generalized) (M62.81);Difficulty in walking, not elsewhere classified (R26.2);Pain Pain - Right/Left: Right Pain - part of body: Hip     Time: 1610-9604 PT Time Calculation (min) (ACUTE ONLY): 24 min  Charges:  $Gait Training: 23-37 mins                    Danielle Dess, PTA 11/08/20, 1:45 PM

## 2020-11-08 NOTE — Progress Notes (Signed)
Occupational Therapy Treatment Patient Details Name: Andrea Hull MRN: [id number] DOB: [birth year] Today's Date: 11/08/2020    History of present illness Andrea Hull is a 45 y.o. who presents with primary osteoarthritis of the R hip. Pt now s/p R THA (Anterior approach).   OT comments  Pt seen for OT tx this date. Pt with family present. Pt/family educated in home/routines modifications, AE/DME, falls prevention, car transfers, and tub transfers as well as positioning for sleep. Pt/family verbalized understanding. Endorse feeling better prepared for discharge home.    Follow Up Recommendations  Home health OT;Supervision - Intermittent    Equipment Recommendations  None recommended by OT    Recommendations for Other Services      Precautions / Restrictions Precautions Precautions: Fall;Anterior Hip Precaution Booklet Issued: No Precaution Comments: reviewed anterior hip precautions Restrictions Weight Bearing Restrictions: Yes RLE Weight Bearing: Weight bearing as tolerated       Mobility Bed Mobility               General bed mobility comments: in recliner before and after    Transfers Overall transfer level: Needs assistance Equipment used: Rolling walker (2 wheeled) Transfers: Sit to/from Stand Sit to Stand: Supervision              Balance Overall balance assessment: Needs assistance Sitting-balance support: Feet supported;No upper extremity supported Sitting balance-Leahy Scale: Good     Standing balance support: During functional activity;Bilateral upper extremity supported Standing balance-Leahy Scale: Fair Standing balance comment: no LOB however reliant on RW /BUE support during dynamic activity                           ADL either performed or assessed with clinical judgement   ADL Overall ADL's : Needs assistance/impaired                                       General ADL Comments: Pt continues to require  assist for pericare after toileting (has ordered a toileting aide) and LB ADL Tasks - family able to assist     Vision       Perception     Praxis      Cognition Arousal/Alertness: Awake/alert Behavior During Therapy: WFL for tasks assessed/performed Overall Cognitive Status: Within Functional Limits for tasks assessed                                          Exercises Other Exercises Other Exercises: Pt/family educated in home/routines modifications, AE/DME, falls prevention, car transfers, and tub transfers as well as positioning for sleep   Shoulder Instructions       General Comments      Pertinent Vitals/ Pain       Pain Assessment: Faces Faces Pain Scale: Hurts little more Pain Location: increased soreness this pm Pain Descriptors / Indicators: Grimacing;Guarding;Sore Pain Intervention(s): Limited activity within patient's tolerance;Monitored during session;Repositioned  Home Living                                          Prior Functioning/Environment              Frequency  Min  2X/week        Progress Toward Goals  OT Goals(current goals can now be found in the care plan section)  Progress towards OT goals: Progressing toward goals  Acute Rehab OT Goals Patient Stated Goal: Decrease overall pain OT Goal Formulation: With patient Time For Goal Achievement: 11/19/20 Potential to Achieve Goals: Good  Plan Discharge plan remains appropriate;Frequency remains appropriate    Co-evaluation                 AM-PAC OT "6 Clicks" Daily Activity     Outcome Measure   Help from another person eating meals?: None Help from another person taking care of personal grooming?: A Little Help from another person toileting, which includes using toliet, bedpan, or urinal?: A Lot Help from another person bathing (including washing, rinsing, drying)?: A Little Help from another person to put on and taking off regular  upper body clothing?: A Little Help from another person to put on and taking off regular lower body clothing?: A Little 6 Click Score: 18    End of Session    OT Visit Diagnosis: Other abnormalities of gait and mobility (R26.89);Pain Pain - Right/Left: Right Pain - part of body: Hip   Activity Tolerance Patient tolerated treatment well   Patient Left in bed;with call bell/phone within reach;with bed alarm set;with family/visitor present;with SCD's reapplied   Nurse Communication          Time: 4098-1191 OT Time Calculation (min): 24 min  Charges: OT General Charges $OT Visit: 1 Visit OT Treatments $Self Care/Home Management : 23-37 mins  Wynona Canes, MPH, MS, OTR/L ascom [phone] 11/08/20, 4:42 PM

## 2020-11-08 NOTE — Progress Notes (Signed)
   Subjective: 4 Days Post-Op Procedure(s) (LRB): TOTAL HIP ARTHROPLASTY ANTERIOR APPROACH (Right) APPLICATION OF CELL SAVER (N/A) Patient reports pain as mild.   Patient is well, and has had no acute complaints or problems Denies any CP, SOB, ABD pain. We will continue therapy today.  Plan is to go Skilled nursing facility after hospital stay.  Objective: Vital signs in last 24 hours: Temp:  [97.9 F (36.6 C)-98.2 F (36.8 C)] 97.9 F (36.6 C) (05/23 0332) Pulse Rate:  [80-91] 80 (05/23 0332) Resp:  [16-20] 20 (05/23 0332) BP: (109-125)/(54-66) 122/54 (05/23 0332) SpO2:  [97 %-100 %] 97 % (05/23 0332)  Intake/Output from previous day: 05/22 0701 - 05/23 0700 In: 240 [P.O.:240] Out: 481 [Urine:480; Stool:1] Intake/Output this shift: No intake/output data recorded.  Recent Labs    11/06/20 0534 11/07/20 0520  HGB 11.5* 10.5*   Recent Labs    11/06/20 0534 11/07/20 0520  WBC 16.5* 13.7*  RBC 3.51* 3.22*  HCT 33.4* 30.9*  PLT 218 216   No results for input(s): NA, K, CL, CO2, BUN, CREATININE, GLUCOSE, CALCIUM in the last 72 hours. No results for input(s): LABPT, INR in the last 72 hours.  EXAM General - Patient is Alert, Appropriate and Oriented Extremity - Neurovascular intact Sensation intact distally Intact pulses distally Dorsiflexion/Plantar flexion intact No cellulitis present Compartment soft Dressing - dressing C/D/I and no drainage, prevena intact with out drainage Motor Function - intact, moving foot and toes well on exam.   Past Medical History:  Diagnosis Date  . Arthritis   . GERD (gastroesophageal reflux disease)    H/O  . Hypertension   . Morbid obesity with BMI of 50.0-59.9, adult (HCC)   . Sleep apnea, obstructive    CPAP  . Tobacco abuse     Assessment/Plan:   4 Days Post-Op Procedure(s) (LRB): TOTAL HIP ARTHROPLASTY ANTERIOR APPROACH (Right) APPLICATION OF CELL SAVER (N/A) Active Problems:   S/P hip replacement  Estimated  body mass index is 52.03 kg/m as calculated from the following:   Height as of 10/25/20: 5' 5.5" (1.664 m).   Weight as of 10/25/20: 144 kg. Advance diet Up with therapy  + BM Labs and VSS. WBC trending down. Pain controlled CM to assist with discharge to SNF. Patient ready for discharge to SNF today  DVT Prophylaxis - Lovenox, TED hose and SCDs Weight-Bearing as tolerated to right leg   T. Cranston Neighbor, PA-C [facility] 11/08/2020, 8:15 AM

## 2020-11-08 NOTE — Progress Notes (Signed)
Physical Therapy Treatment Patient Details Name: Andrea Hull MRN: [id number] DOB: 09/30/1975 Today's Date: 11/08/2020    History of Present Illness Andrea Hull is a 45 y.o. who presents with primary osteoarthritis of the R hip. Pt now s/p R THA (Anterior approach).    PT Comments    Able to walk to gym and complete stair training then an additional 40'.  Overall does well with steps.  Family installed bilateral rails this week-end in anticipation of discharge.  After discussion regarding progress and improved mobility, she is in agreement that discharge home is more appropriate at this time.  She would like to stay 1 more night if able. Discussed with TOC and will message Dr. Rosita Kea regarding pt request.     Follow Up Recommendations  Supervision - Intermittent     Equipment Recommendations  Rolling walker with 5" wheels;3in1 (PT) (bariatric)    Recommendations for Other Services       Precautions / Restrictions Precautions Precautions: Fall;Anterior Hip Precaution Booklet Issued: No Precaution Comments: reviewed anterior hip precautions Restrictions Weight Bearing Restrictions: Yes RLE Weight Bearing: Weight bearing as tolerated    Mobility  Bed Mobility               General bed mobility comments: in recliner before and after    Transfers Overall transfer level: Needs assistance Equipment used: Rolling walker (2 wheeled) Transfers: Sit to/from Stand Sit to Stand: Supervision            Ambulation/Gait Ambulation/Gait assistance: Min guard Gait Distance (Feet): 120 Feet Assistive device: Rolling walker (2 wheeled) Gait Pattern/deviations: Step-to pattern;Antalgic Gait velocity: decreased but improved from yesterday       Stairs Stairs: Yes Stairs assistance: Min guard Stair Management: Two rails;Step to pattern Number of Stairs: 4 General stair comments: overall does well.   Wheelchair Mobility    Modified Rankin (Stroke Patients  Only)       Balance Overall balance assessment: Needs assistance Sitting-balance support: Feet supported;No upper extremity supported Sitting balance-Leahy Scale: Good     Standing balance support: During functional activity;Bilateral upper extremity supported Standing balance-Leahy Scale: Fair Standing balance comment: no LOB however reliant on RW /BUE support during dynamic activity                            Cognition Arousal/Alertness: Awake/alert Behavior During Therapy: WFL for tasks assessed/performed Overall Cognitive Status: Within Functional Limits for tasks assessed                                        Exercises Other Exercises Other Exercises: reports doign HEP earlier today    General Comments        Pertinent Vitals/Pain Pain Assessment: Faces Faces Pain Scale: Hurts little more Pain Location: increased soreness this pm Pain Descriptors / Indicators: Grimacing;Guarding;Sore Pain Intervention(s): Limited activity within patient's tolerance;Monitored during session;Repositioned    Home Living                      Prior Function            PT Goals (current goals can now be found in the care plan section) Progress towards PT goals: Progressing toward goals    Frequency    BID      PT Plan Discharge plan needs to be updated  Co-evaluation              AM-PAC PT "6 Clicks" Mobility   Outcome Measure  Help needed turning from your back to your side while in a flat bed without using bedrails?: A Little Help needed moving from lying on your back to sitting on the side of a flat bed without using bedrails?: A Little Help needed moving to and from a bed to a chair (including a wheelchair)?: A Little Help needed standing up from a chair using your arms (e.g., wheelchair or bedside chair)?: A Little Help needed to walk in hospital room?: A Little Help needed climbing 3-5 steps with a railing? : A  Little 6 Click Score: 18    End of Session Equipment Utilized During Treatment: Gait belt Activity Tolerance: Patient tolerated treatment well Patient left: in chair;with call bell/phone within reach;with chair alarm set Nurse Communication: Mobility status PT Visit Diagnosis: Unsteadiness on feet (R26.81);Other abnormalities of gait and mobility (R26.89);Muscle weakness (generalized) (M62.81);Difficulty in walking, not elsewhere classified (R26.2);Pain Pain - Right/Left: Right Pain - part of body: Hip     Time: 0240-9735 PT Time Calculation (min) (ACUTE ONLY): 26 min  Charges:  $Gait Training: 23-37 mins                    Danielle Dess, PTA 11/08/20, 1:50 PM

## 2020-11-08 NOTE — TOC Progression Note (Signed)
Transition of Care (TOC) - Progression Note    Patient Details  Name: Andrea Hull MRN: [id number] Date of Birth: 09/08/1975  Transition of Care (TOC) CM/SW Contact  Deliliah J Gregory, RN Phone Number: 11/08/2020, 1:49 PM  Clinical Narrative:   Met with the patient to discuss DC plan and needs She has transportation and can afford her meds She needs a Bariatric RW and 3 in 1, I notified Zack with [employer] and it will be brought to the room prior to DC, she is set up with HH thru [facility]        Expected Discharge Plan and Services                                                 Social Determinants of Health (SDOH) Interventions    Readmission Risk Interventions No flowsheet data found.  

## 2020-11-08 NOTE — Progress Notes (Signed)
OT Cancellation Note  Patient Details Name: Andrea Hull MRN: [id number] DOB: [birth year]   Cancelled Treatment:    Reason Eval/Treat Not Completed: Other (comment). Upon attempt, pt working with PTA. Will re-attempt at later date/time as pt is available and appropriate.  Wynona Canes, MPH, MS, OTR/L ascom [phone] 11/08/20, 10:50 AM

## 2020-11-09 NOTE — Plan of Care (Signed)
  Problem: Health Behavior/Discharge Planning: Goal: Ability to manage health-related needs will improve 11/09/2020 1006 by Pierce Crane, RN Outcome: Adequate for Discharge 11/09/2020 1006 by Pierce Crane, RN Outcome: Adequate for Discharge   Problem: Clinical Measurements: Goal: Ability to maintain clinical measurements within normal limits will improve 11/09/2020 1006 by Pierce Crane, RN Outcome: Adequate for Discharge 11/09/2020 1006 by Pierce Crane, RN Outcome: Adequate for Discharge Goal: Will remain free from infection 11/09/2020 1006 by Pierce Crane, RN Outcome: Adequate for Discharge 11/09/2020 1006 by Pierce Crane, RN Outcome: Adequate for Discharge   Problem: Education: Goal: Knowledge of the prescribed therapeutic regimen will improve 11/09/2020 1006 by Pierce Crane, RN Outcome: Adequate for Discharge 11/09/2020 1006 by Pierce Crane, RN Outcome: Adequate for Discharge Goal: Understanding of discharge needs will improve 11/09/2020 1006 by Pierce Crane, RN Outcome: Adequate for Discharge 11/09/2020 1006 by Pierce Crane, RN Outcome: Adequate for Discharge Goal: Individualized Educational Video(s) 11/09/2020 1006 by Pierce Crane, RN Outcome: Adequate for Discharge 11/09/2020 1006 by Pierce Crane, RN Outcome: Adequate for Discharge   Problem: Activity: Goal: Ability to avoid complications of mobility impairment will improve 11/09/2020 1006 by Pierce Crane, RN Outcome: Adequate for Discharge 11/09/2020 1006 by Pierce Crane, RN Outcome: Adequate for Discharge   Problem: Clinical Measurements: Goal: Postoperative complications will be avoided or minimized 11/09/2020 1006 by Pierce Crane, RN Outcome: Adequate for Discharge 11/09/2020 1006 by Pierce Crane, RN Outcome: Adequate for Discharge   Problem: Pain Management: Goal: Pain level will decrease with appropriate interventions 11/09/2020 1006 by  Pierce Crane, RN Outcome: Adequate for Discharge 11/09/2020 1006 by Pierce Crane, RN Outcome: Adequate for Discharge   Problem: Skin Integrity: Goal: Will show signs of wound healing 11/09/2020 1006 by Pierce Crane, RN Outcome: Adequate for Discharge 11/09/2020 1006 by Pierce Crane, RN Outcome: Adequate for Discharge   Problem: Acute Rehab OT Goals (only OT should resolve) Goal: Pt. Will Perform Grooming Outcome: Adequate for Discharge Goal: Pt. Will Perform Lower Body Dressing Outcome: Adequate for Discharge Goal: Pt. Will Transfer To Toilet Outcome: Adequate for Discharge Goal: Pt. Will Perform Toileting-Clothing Manipulation Outcome: Adequate for Discharge   Problem: Acute Rehab PT Goals(only PT should resolve) Goal: Pt Will Go Supine/Side To Sit Outcome: Adequate for Discharge Goal: Patient Will Transfer Sit To/From Stand Outcome: Adequate for Discharge Goal: Pt Will Transfer Bed To Chair/Chair To Bed Outcome: Adequate for Discharge Goal: Pt Will Ambulate Outcome: Adequate for Discharge Goal: Pt Will Go Up/Down Stairs Outcome: Adequate for Discharge

## 2020-11-09 NOTE — Progress Notes (Signed)
Physical Therapy Treatment Patient Details Name: Andrea Hull MRN: [id number] DOB: 05/09/1976 Today's Date: 11/09/2020    History of Present Illness Andrea Hull is a 45 y.o. who presents with primary osteoarthritis of the R hip. Pt now s/p R THA (Anterior approach).    PT Comments    Despite c/o pain pt showed good effort with all aspects of PT session.  She was able to circumambulate the nurses' station with slow and guarded gait, yet this was much better, more consistent and faster than previous efforts.  She was also able to negotiate up/down steps w/o physical assist and only minimal cuing.  She did have fatigue with the effort, O2 sats stable but HR rising to ~140 and pt having DOE.  Pt's biggest limiter was pain, however she was able to push through this and show great effort.     Follow Up Recommendations  Supervision - Intermittent     Equipment Recommendations  Rolling walker with 5" wheels;3in1 (PT)    Recommendations for Other Services       Precautions / Restrictions Restrictions Weight Bearing Restrictions: Yes RLE Weight Bearing: Weight bearing as tolerated    Mobility  Bed Mobility Overal bed mobility: Modified Independent Bed Mobility: Supine to Sit     Supine to sit: Min guard     General bed mobility comments: Pt able to get heself slowly, but independently to EOB    Transfers Overall transfer level: Needs assistance Equipment used: Rolling walker (2 wheeled) Transfers: Sit to/from Stand Sit to Stand: Supervision         General transfer comment: CGA to stand. vcs for improved technique  Ambulation/Gait Ambulation/Gait assistance: Min guard Gait Distance (Feet): 200 Feet Assistive device: Rolling walker (2 wheeled)       General Gait Details: Pt continues to have stop/go ambulaiton but is much faster and closer to consistent cadence this date.  She remains reliant on UEs on walker (especially with R WBing) but able to maintain some  consistency with cadence.  Good overall effort, pt needed on seated rest break and did have HR to ~140 by the end with plenty of fatigue, O2 remained in the high 90s despite DOE and increased resp rate   Stairs Stairs: Yes Stairs assistance: Min guard Stair Management: Two rails;Step to pattern Number of Stairs: 4 General stair comments: No phyiscal or verbal cues required for safe (though slow) stair negotiation   Wheelchair Mobility    Modified Rankin (Stroke Patients Only)       Balance Overall balance assessment: Needs assistance Sitting-balance support: Feet supported;No upper extremity supported Sitting balance-Leahy Scale: Good       Standing balance-Leahy Scale: Fair Standing balance comment: no LOB however reliant on RW /BUE support during dynamic activity                            Cognition Arousal/Alertness: Awake/alert Behavior During Therapy: WFL for tasks assessed/performed Overall Cognitive Status: Within Functional Limits for tasks assessed                                        Exercises Total Joint Exercises Ankle Circles/Pumps: AROM;10 reps Quad Sets: Strengthening;15 reps Short Arc Quad: Strengthening;15 reps Heel Slides: AROM;10 reps (with lightly resisted leg extensions) Hip ABduction/ADduction: Strengthening;10 reps    General Comments  Pertinent Vitals/Pain Pain Assessment: 0-10 Pain Score: 7     Home Living                      Prior Function            PT Goals (current goals can now be found in the care plan section) Progress towards PT goals: Progressing toward goals    Frequency    BID      PT Plan Discharge plan needs to be updated    Co-evaluation              AM-PAC PT "6 Clicks" Mobility   Outcome Measure  Help needed turning from your back to your side while in a flat bed without using bedrails?: A Little Help needed moving from lying on your back to sitting  on the side of a flat bed without using bedrails?: A Little Help needed moving to and from a bed to a chair (including a wheelchair)?: A Little Help needed standing up from a chair using your arms (e.g., wheelchair or bedside chair)?: A Little Help needed to walk in hospital room?: A Little Help needed climbing 3-5 steps with a railing? : A Little 6 Click Score: 18    End of Session Equipment Utilized During Treatment: Gait belt Activity Tolerance: Patient tolerated treatment well Patient left: in chair;with call bell/phone within reach;with chair alarm set Nurse Communication: Mobility status PT Visit Diagnosis: Unsteadiness on feet (R26.81);Other abnormalities of gait and mobility (R26.89);Muscle weakness (generalized) (M62.81);Difficulty in walking, not elsewhere classified (R26.2);Pain Pain - Right/Left: Right Pain - part of body: Hip     Time: 3614-4315 PT Time Calculation (min) (ACUTE ONLY): 40 min  Charges:  $Gait Training: 8-22 mins $Therapeutic Exercise: 8-22 mins $Therapeutic Activity: 8-22 mins                     Malachi Pro, DPT 11/09/2020, 10:23 AM

## 2020-11-09 NOTE — Plan of Care (Signed)
  Problem: Health Behavior/Discharge Planning: Goal: Ability to manage health-related needs will improve Outcome: Adequate for Discharge   Problem: Clinical Measurements: Goal: Ability to maintain clinical measurements within normal limits will improve Outcome: Adequate for Discharge Goal: Will remain free from infection Outcome: Adequate for Discharge   Problem: Education: Goal: Knowledge of the prescribed therapeutic regimen will improve Outcome: Adequate for Discharge Goal: Understanding of discharge needs will improve Outcome: Adequate for Discharge Goal: Individualized Educational Video(s) Outcome: Adequate for Discharge   Problem: Activity: Goal: Ability to avoid complications of mobility impairment will improve Outcome: Adequate for Discharge   Problem: Clinical Measurements: Goal: Postoperative complications will be avoided or minimized Outcome: Adequate for Discharge   Problem: Pain Management: Goal: Pain level will decrease with appropriate interventions Outcome: Adequate for Discharge   Problem: Skin Integrity: Goal: Will show signs of wound healing Outcome: Adequate for Discharge

## 2020-11-09 NOTE — Anesthesia Postprocedure Evaluation (Signed)
Anesthesia Post Note  Patient: Cristi Loron  Procedure(s) Performed: TOTAL HIP ARTHROPLASTY ANTERIOR APPROACH (Right Hip) APPLICATION OF CELL SAVER (N/A )  Patient location during evaluation: PACU Anesthesia Type: General Level of consciousness: awake and alert and oriented Pain management: pain level controlled Vital Signs Assessment: post-procedure vital signs reviewed and stable Respiratory status: spontaneous breathing Cardiovascular status: blood pressure returned to baseline Anesthetic complications: no   No complications documented.   Last Vitals:  Vitals:   11/09/20 0733 11/09/20 1120  BP: (!) 103/47 (!) 145/67  Pulse: 81 77  Resp: 17 16  Temp: 36.8 C 36.9 C  SpO2: 97% 99%    Last Pain:  Vitals:   11/09/20 1331  TempSrc:   PainSc: 8                  Mackinley Cassaday

## 2020-11-09 NOTE — Progress Notes (Signed)
   Subjective: 5 Days Post-Op Procedure(s) (LRB): TOTAL HIP ARTHROPLASTY ANTERIOR APPROACH (Right) APPLICATION OF CELL SAVER (N/A) Patient reports pain as mild.   Patient is well, and has had no acute complaints or problems Denies any CP, SOB, ABD pain. We will continue therapy today.  Plan is to go Home today  Objective: Vital signs in last 24 hours: Temp:  [97.6 F (36.4 C)-98.3 F (36.8 C)] 98.3 F (36.8 C) (05/24 0733) Pulse Rate:  [81-85] 81 (05/24 0733) Resp:  [17-20] 17 (05/24 0733) BP: (103-124)/(47-60) 103/47 (05/24 0733) SpO2:  [94 %-100 %] 97 % (05/24 0733)  Intake/Output from previous day: 05/23 0701 - 05/24 0700 In: 240 [P.O.:240] Out: 0  Intake/Output this shift: No intake/output data recorded.  Recent Labs    11/07/20 0520  HGB 10.5*   Recent Labs    11/07/20 0520  WBC 13.7*  RBC 3.22*  HCT 30.9*  PLT 216   No results for input(s): NA, K, CL, CO2, BUN, CREATININE, GLUCOSE, CALCIUM in the last 72 hours. No results for input(s): LABPT, INR in the last 72 hours.  EXAM General - Patient is Alert, Appropriate and Oriented Extremity - Neurovascular intact Sensation intact distally Intact pulses distally Dorsiflexion/Plantar flexion intact No cellulitis present Compartment soft Dressing - dressing C/D/I and no drainage, prevena intact with out drainage Motor Function - intact, moving foot and toes well on exam.   Past Medical History:  Diagnosis Date  . Arthritis   . GERD (gastroesophageal reflux disease)    H/O  . Hypertension   . Morbid obesity with BMI of 50.0-59.9, adult (HCC)   . Sleep apnea, obstructive    CPAP  . Tobacco abuse     Assessment/Plan:   5 Days Post-Op Procedure(s) (LRB): TOTAL HIP ARTHROPLASTY ANTERIOR APPROACH (Right) APPLICATION OF CELL SAVER (N/A) Active Problems:   S/P hip replacement  Estimated body mass index is 52.03 kg/m as calculated from the following:   Height as of 10/25/20: 5' 5.5" (1.664 m).   Weight  as of 10/25/20: 144 kg. Advance diet Up with therapy  + BM VSS.  Pain controlled CM to assist with discharge to home. Patient ready for discharge to home with HHPT today  DVT Prophylaxis - Lovenox, TED hose and SCDs Weight-Bearing as tolerated to right leg   T. Cranston Neighbor, PA-C [facility] 11/09/2020, 9:39 AM

## 2021-03-08 ENCOUNTER — Other Ambulatory Visit: Payer: Self-pay | Admitting: Family Medicine

## 2021-03-08 NOTE — Telephone Encounter (Signed)
Last office visit 08/12/2020 for CPE.  Not on current medication list.  Refill states:  The original prescription was discontinued on 11/09/2020 by Evon Slack, PA-C.

## 2021-05-03 ENCOUNTER — Other Ambulatory Visit
Admission: RE | Admit: 2021-05-03 | Discharge: 2021-05-03 | Disposition: A | Discharge status: Home / Self Care | Source: Ambulatory Visit | Attending: Sports Medicine | Admitting: Sports Medicine

## 2021-05-03 DIAGNOSIS — M1712 Unilateral primary osteoarthritis, left knee: Secondary | ICD-10-CM | POA: Diagnosis present

## 2021-05-03 DIAGNOSIS — M25562 Pain in left knee: Secondary | ICD-10-CM | POA: Diagnosis present

## 2021-05-03 DIAGNOSIS — M25462 Effusion, left knee: Secondary | ICD-10-CM | POA: Diagnosis present

## 2021-05-03 DIAGNOSIS — G8929 Other chronic pain: Secondary | ICD-10-CM | POA: Insufficient documentation

## 2021-05-03 DIAGNOSIS — M25561 Pain in right knee: Secondary | ICD-10-CM | POA: Diagnosis present

## 2021-05-03 LAB — SYNOVIAL FLUID, CRYSTAL: Crystals, Fluid: NONE SEEN

## 2021-06-16 ENCOUNTER — Encounter: Payer: Self-pay | Admitting: Family

## 2021-06-16 ENCOUNTER — Telehealth (INDEPENDENT_AMBULATORY_CARE_PROVIDER_SITE_OTHER): Admitting: Family

## 2021-06-16 VITALS — BP 118/72 | HR 52 | Temp 98.4°F | Ht 65.5 in | Wt 336.0 lb

## 2021-06-16 DIAGNOSIS — J3489 Other specified disorders of nose and nasal sinuses: Secondary | ICD-10-CM | POA: Diagnosis not present

## 2021-06-16 DIAGNOSIS — J069 Acute upper respiratory infection, unspecified: Secondary | ICD-10-CM | POA: Insufficient documentation

## 2021-06-16 MED ORDER — METHYLPREDNISOLONE 4 MG PO TBPK: ORAL_TABLET | ORAL | 0 refills | Status: DC

## 2021-06-16 MED ORDER — CEFDINIR 300 MG PO CAPS: 300.0000 mg | ORAL_CAPSULE | Freq: Two times a day (BID) | ORAL | 0 refills | Status: AC

## 2021-06-16 NOTE — Assessment & Plan Note (Signed)
Medrol dose pack rx sent to pharmacy ,pt advised to d/c voltaren temporarily- start steroid pack, and then don't restart voltaren until 48 hours after stopping steroid pack.

## 2021-06-16 NOTE — Assessment & Plan Note (Signed)
Take antibiotic as prescribed. Increase oral fluids. Pt to f/u if sx worsen and or fail to improve in 2-3 days.  

## 2021-06-16 NOTE — Progress Notes (Signed)
MyChart Video Visit    Virtual Visit via Video Note   This visit type was conducted due to national recommendations for restrictions regarding the COVID-19 Pandemic (e.g. social distancing) in an effort to limit this patient's exposure and mitigate transmission in our community. This patient is at least at moderate risk for complications without adequate follow up. This format is felt to be most appropriate for this patient at this time. Physical exam was limited by quality of the video and audio technology used for the visit. CMA was able to get the patient set up on a video visit.  Patient location: Home. Patient and provider in visit Provider location: Office  I discussed the limitations of evaluation and management by telemedicine and the availability of in person appointments. The patient expressed understanding and agreed to proceed.  Visit Date: 06/16/2021  Today's healthcare provider: Mort Sawyers, FNP     Subjective:    Patient ID: Andrea Hull, female    DOB: [birth year], 45 y.o.   MRN: [id number]  Chief Complaint  Patient presents with   URI   fluid behind right ear   Nasal Congestion    URI  Associated symptoms include congestion, coughing (denies chest congestion), ear pain (right fullness) and a sore throat. Pertinent negatives include no chest pain or wheezing.   45 y/o female with c/o sx for 6 days. Her son has been treated for bronchitis. She has sore throat, nasal congestion, feeling of fullness right ear and 'really bad coughing' over the weekend that caused her to throw up a few times. Low grade fever Monday and Tuesday (up to 99.41F). coughing improving a bit, bu tin the afternoon becomes hacking and uncontrollable. Denies chest congestion. No sob or wheezing.  Has not been tested for covid and or flu.  Past Medical History:  Diagnosis Date   Arthritis    GERD (gastroesophageal reflux disease)    H/O   Hypertension    Morbid obesity with BMI of  50.0-59.9, adult (HCC)    Sleep apnea, obstructive    CPAP   Tobacco abuse     Past Surgical History:  Procedure Laterality Date   Breast Biospy  2013   BREAST SURGERY     CESAREAN SECTION     CHOLECYSTECTOMY N/A 11/17/2014   Procedure: LAPAROSCOPIC CHOLECYSTECTOMY WITH INTRAOPERATIVE CHOLANGIOGRAM;  Surgeon: Tiney Rouge III, MD;  Location: [facility] ORS;  Service: General;  Laterality: N/A;   DILATION AND CURETTAGE OF UTERUS     Miscarriage  2008   TOTAL HIP ARTHROPLASTY Right 11/04/2020   Procedure: TOTAL HIP ARTHROPLASTY ANTERIOR APPROACH;  Surgeon: Kennedy Bucker, MD;  Location: [facility] ORS;  Service: Orthopedics;  Laterality: Right;   WISDOM TOOTH EXTRACTION      Family History  Problem Relation Age of Onset   Arthritis Mother    Alcohol abuse Father    Arthritis Father    Hyperlipidemia Father    Hypertension Father    Diabetes Father    Leukemia Father    Personality disorder Sister    Anxiety disorder Sister    Diabetes Maternal Grandmother    Lung cancer Maternal Grandfather    Alcohol abuse Paternal Grandmother    Breast cancer Paternal Grandmother    Alcohol abuse Paternal Grandfather    Arthritis Paternal Grandfather    Heart disease Paternal Grandfather    Hypertension Sister    Hypertension Brother    Ovarian cancer Neg Hx    Colon cancer Neg Hx  Social History   Socioeconomic History   Marital status: Married    Spouse name: Not on file   Number of children: Not on file   Years of education: Not on file   Highest education level: Not on file  Occupational History   Not on file  Tobacco Use   Smoking status: Former    Packs/day: 1.00    Years: 20.00    Pack years: 20.00    Types: Cigarettes    Quit date: 08/11/2019    Years since quitting: 1.8   Smokeless tobacco: Never  Vaping Use   Vaping Use: Some days   Substances: Nicotine, Flavoring  Substance and Sexual Activity   Alcohol use: Yes    Alcohol/week: 0.0 standard drinks    Comment: OCC    Drug use: No   Sexual activity: Yes    Birth control/protection: Coitus interruptus  Other Topics Concern   Not on file  Social History Narrative   Not on file   Social Determinants of Health   Financial Resource Strain: Not on file  Food Insecurity: Not on file  Transportation Needs: Not on file  Physical Activity: Not on file  Stress: Not on file  Social Connections: Not on file  Intimate Partner Violence: Not on file    Outpatient Medications Prior to Visit  Medication Sig Dispense Refill   acetaminophen (TYLENOL) 650 MG CR tablet Take 1,300 mg by mouth in the morning, at noon, and at bedtime.     diclofenac (VOLTAREN) 75 MG EC tablet Take 1 tablet (75 mg total) by mouth 2 (two) times daily. 180 tablet 1   diphenhydrAMINE (BENADRYL) 50 MG tablet Take 75 mg by mouth at bedtime as needed for itching.     docusate sodium (COLACE) 100 MG capsule Take 1 capsule (100 mg total) by mouth 2 (two) times daily. 10 capsule 0   lisinopril-hydrochlorothiazide (ZESTORETIC) 10-12.5 MG tablet TAKE 1 TABLET BY MOUTH DAILY 90 tablet 3   MELATONIN PO Take 15 mg by mouth at bedtime.     methocarbamol (ROBAXIN) 500 MG tablet Take 1 tablet (500 mg total) by mouth every 6 (six) hours as needed for muscle spasms. 30 tablet 0   terbinafine (LAMISIL) 1 % cream Apply 1 application topically as needed. YEAST INFECTION (UNDER ABDOMINAL FOLDS)     enoxaparin (LOVENOX) 40 MG/0.4ML injection Inject 0.4 mLs (40 mg total) into the skin daily for 14 days. 5.6 mL 0   oxyCODONE (OXY IR/ROXICODONE) 5 MG immediate release tablet Take 1-2 tablets (5-10 mg total) by mouth every 4 (four) hours as needed for moderate pain (pain score 4-6). (Patient not taking: Reported on 06/16/2021) 30 tablet 0   traMADol (ULTRAM) 50 MG tablet Take 1 tablet (50 mg total) by mouth every 6 (six) hours as needed. (Patient not taking: Reported on 06/16/2021) 30 tablet 0   No facility-administered medications prior to visit.    Allergies   Allergen Reactions   Levofloxacin Other (See Comments)    Joint pain    Review of Systems  Constitutional:  Positive for fever. Negative for chills.  HENT:  Positive for congestion, ear pain (right fullness) and sore throat.   Respiratory:  Positive for cough (denies chest congestion) and sputum production. Negative for shortness of breath and wheezing.   Cardiovascular:  Negative for chest pain.  All other systems reviewed and are negative.     Objective:    Physical Exam Constitutional:      General: She is  not in acute distress.    Appearance: Normal appearance. She is obese. She is not ill-appearing, toxic-appearing or diaphoretic.  HENT:     Head: Normocephalic.  Pulmonary:     Effort: Pulmonary effort is normal.  Neurological:     General: No focal deficit present.     Mental Status: She is alert and oriented to person, place, and time.  Psychiatric:        Mood and Affect: Mood normal.        Behavior: Behavior normal.        Thought Content: Thought content normal.        Judgment: Judgment normal.    BP 118/72    Pulse (!) 52    Temp 98.4 F (36.9 C) (Oral)    Ht 5' 5.5" (1.664 m)    Wt (!) 336 lb (152.4 kg)    BMI 55.06 kg/m  Wt Readings from Last 3 Encounters:  06/16/21 (!) 336 lb (152.4 kg)  10/25/20 (!) 317 lb 7.4 oz (144 kg)  08/12/20 (!) 307 lb 4 oz (139.4 kg)       Assessment & Plan:   Problem List Items Addressed This Visit       Respiratory   Upper respiratory infection, acute - Primary    Take antibiotic as prescribed. Increase oral fluids. Pt to f/u if sx worsen and or fail to improve in 2-3 days.       Relevant Medications   cefdinir (OMNICEF) 300 MG capsule     Other   Sinus pressure    Medrol dose pack rx sent to pharmacy ,pt advised to d/c voltaren temporarily- start steroid pack, and then don't restart voltaren until 48 hours after stopping steroid pack.       Relevant Medications   methylPREDNISolone (MEDROL DOSEPAK) 4 MG TBPK  tablet    I have discontinued Coretha L. Saraceno's oxyCODONE and traMADol. I am also having her start on cefdinir and methylPREDNISolone. Additionally, I am having her maintain her MELATONIN PO, acetaminophen, terbinafine, lisinopril-hydrochlorothiazide, diphenhydrAMINE, docusate sodium, enoxaparin, methocarbamol, and diclofenac.  Meds ordered this encounter  Medications   cefdinir (OMNICEF) 300 MG capsule    Sig: Take 1 capsule (300 mg total) by mouth 2 (two) times daily for 10 days.    Dispense:  20 capsule    Refill:  0    Order Specific Question:   Supervising Provider    Answer:   BEDSOLE, AMY E [[id number]]   methylPREDNISolone (MEDROL DOSEPAK) 4 MG TBPK tablet    Sig: Take per package instructions    Dispense:  21 tablet    Refill:  0    Order Specific Question:   Supervising Provider    Answer:   BEDSOLE, AMY E [[id number]]    I discussed the assessment and treatment plan with the patient. The patient was provided an opportunity to ask questions and all were answered. The patient agreed with the plan and demonstrated an understanding of the instructions.   The patient was advised to call back or seek an in-person evaluation if the symptoms worsen or if the condition fails to improve as anticipated.  I provided 24 minutes of face-to-face time during this encounter.   Mort Sawyers, FNP [facility] [phone] (phone) [phone] (fax)  [facility]

## 2021-06-16 NOTE — Patient Instructions (Signed)
Antibiotic sent to preferred pharmacy.  Medrol dose pack sent to pharmacy, advised pt to stop voltaren, and to restart 48 hours after stopping the steroid pack.  Ok to take antihistamine.   Please increase oral fluids, steamy hot shower/humidifier prn.  Please follow up if no improvement in 2-3 days.   It was a pleasure seeing you today! Please do not hesitate to reach out with any questions and or concerns.  Regards,   Mort Sawyers

## 2021-09-02 ENCOUNTER — Other Ambulatory Visit: Payer: Self-pay | Admitting: Family Medicine

## 2021-11-16 ENCOUNTER — Other Ambulatory Visit: Payer: Self-pay | Admitting: Family Medicine

## 2021-11-16 DIAGNOSIS — Z1231 Encounter for screening mammogram for malignant neoplasm of breast: Secondary | ICD-10-CM

## 2021-11-22 ENCOUNTER — Other Ambulatory Visit: Payer: Self-pay | Admitting: Family Medicine

## 2021-11-22 DIAGNOSIS — E785 Hyperlipidemia, unspecified: Secondary | ICD-10-CM

## 2021-11-22 DIAGNOSIS — E559 Vitamin D deficiency, unspecified: Secondary | ICD-10-CM

## 2021-11-22 DIAGNOSIS — Z79899 Other long term (current) drug therapy: Secondary | ICD-10-CM

## 2021-11-22 DIAGNOSIS — Z131 Encounter for screening for diabetes mellitus: Secondary | ICD-10-CM

## 2021-11-24 ENCOUNTER — Other Ambulatory Visit (INDEPENDENT_AMBULATORY_CARE_PROVIDER_SITE_OTHER)

## 2021-11-24 DIAGNOSIS — E785 Hyperlipidemia, unspecified: Secondary | ICD-10-CM

## 2021-11-24 DIAGNOSIS — Z79899 Other long term (current) drug therapy: Secondary | ICD-10-CM

## 2021-11-24 DIAGNOSIS — E559 Vitamin D deficiency, unspecified: Secondary | ICD-10-CM

## 2021-11-24 DIAGNOSIS — Z131 Encounter for screening for diabetes mellitus: Secondary | ICD-10-CM

## 2021-11-24 LAB — BASIC METABOLIC PANEL
BUN: 22 mg/dL (ref 6–23)
CO2: 26 mEq/L (ref 19–32)
Calcium: 9.8 mg/dL (ref 8.4–10.5)
Chloride: 101 mEq/L (ref 96–112)
Creatinine, Ser: 0.8 mg/dL (ref 0.40–1.20)
GFR: 88.62 mL/min (ref >60.00)
Glucose, Bld: 102 mg/dL — ABNORMAL HIGH (ref 70–99)
Potassium: 4.3 mEq/L (ref 3.5–5.1)
Sodium: 138 mEq/L (ref 135–145)

## 2021-11-24 LAB — HEPATIC FUNCTION PANEL
ALT: 16 U/L (ref 0–35)
AST: 13 U/L (ref 0–37)
Albumin: 4.3 g/dL (ref 3.5–5.2)
Alkaline Phosphatase: 62 U/L (ref 39–117)
Bilirubin, Direct: 0.1 mg/dL (ref 0.0–0.3)
Total Bilirubin: 0.4 mg/dL (ref 0.2–1.2)
Total Protein: 7.1 g/dL (ref 6.0–8.3)

## 2021-11-24 LAB — LIPID PANEL
Cholesterol: 267 mg/dL — ABNORMAL HIGH (ref 0–200)
HDL: 45 mg/dL (ref >39.00)
NonHDL: 221.61
Total CHOL/HDL Ratio: 6
Triglycerides: 311 mg/dL — ABNORMAL HIGH (ref 0.0–149.0)
VLDL: 62.2 mg/dL — ABNORMAL HIGH (ref 0.0–40.0)

## 2021-11-24 LAB — VITAMIN D 25 HYDROXY (VIT D DEFICIENCY, FRACTURES): VITD: 10.34 ng/mL — ABNORMAL LOW (ref 30.00–100.00)

## 2021-11-24 LAB — CBC WITH DIFFERENTIAL/PLATELET
Basophils Absolute: 0.1 10*3/uL (ref 0.0–0.1)
Basophils Relative: 0.9 % (ref 0.0–3.0)
Eosinophils Absolute: 0.4 10*3/uL (ref 0.0–0.7)
Eosinophils Relative: 4.3 % (ref 0.0–5.0)
HCT: 40.3 % (ref 36.0–46.0)
Hemoglobin: 13.6 g/dL (ref 12.0–15.0)
Lymphocytes Relative: 35.6 % (ref 12.0–46.0)
Lymphs Abs: 3.6 10*3/uL (ref 0.7–4.0)
MCHC: 33.8 g/dL (ref 30.0–36.0)
MCV: 97.4 fl (ref 78.0–100.0)
Monocytes Absolute: 0.6 10*3/uL (ref 0.1–1.0)
Monocytes Relative: 6.3 % (ref 3.0–12.0)
Neutro Abs: 5.4 10*3/uL (ref 1.4–7.7)
Neutrophils Relative %: 52.9 % (ref 43.0–77.0)
Platelets: 305 10*3/uL (ref 150.0–400.0)
RBC: 4.14 Mil/uL (ref 3.87–5.11)
RDW: 13.2 % (ref 11.5–15.5)
WBC: 10.2 10*3/uL (ref 4.0–10.5)

## 2021-11-24 LAB — LDL CHOLESTEROL, DIRECT: Direct LDL: 173 mg/dL

## 2021-11-24 LAB — HEMOGLOBIN A1C: Hgb A1c MFr Bld: 5.6 % (ref 4.6–6.5)

## 2021-11-24 LAB — TSH: TSH: 2.07 u[IU]/mL (ref 0.35–5.50)

## 2021-11-29 DIAGNOSIS — M1711 Unilateral primary osteoarthritis, right knee: Secondary | ICD-10-CM | POA: Insufficient documentation

## 2021-11-29 DIAGNOSIS — M19012 Primary osteoarthritis, left shoulder: Secondary | ICD-10-CM | POA: Insufficient documentation

## 2021-12-01 ENCOUNTER — Encounter: Payer: Self-pay | Admitting: Family Medicine

## 2021-12-01 ENCOUNTER — Ambulatory Visit (INDEPENDENT_AMBULATORY_CARE_PROVIDER_SITE_OTHER): Admitting: Family Medicine

## 2021-12-01 VITALS — BP 120/76 | HR 86 | Temp 98.9°F | Ht 65.75 in | Wt 346.2 lb

## 2021-12-01 DIAGNOSIS — Z0000 Encounter for general adult medical examination without abnormal findings: Secondary | ICD-10-CM

## 2021-12-01 DIAGNOSIS — Z1211 Encounter for screening for malignant neoplasm of colon: Secondary | ICD-10-CM

## 2021-12-01 MED ORDER — DULOXETINE HCL 40 MG PO CPEP
1.0000 | ORAL_CAPSULE | Freq: Every day | ORAL | 3 refills | Status: DC
Start: 2021-12-01 — End: 2022-11-27

## 2021-12-01 NOTE — Progress Notes (Unsigned)
Jacquese Cassarino T. Javi Bollman, MD, [place] at [facility] [place], [address]  Phone: [phone]  FAX: [place] - 46 y.o. female  MRN [id number]  Date of Birth: 1975-07-30  Date: 12/01/2021  PCP: Owens Loffler, MD  Referral: Owens Loffler, MD  Chief Complaint  Patient presents with   Annual Exam   Patient Care Team: Owens Loffler, MD as PCP - General (Family Medicine) Subjective:   Andrea Hull is a 46 y.o. pleasant patient who presents with the following:  Health Maintenance Summary Reviewed and updated, unless pt declines services.  Tobacco History Reviewed. Non-smoker Alcohol: No concerns, no excessive use Exercise Habits: Some activity, rec at least 30 mins 5 times a week STD concerns: none Drug Use: None Lumps or breast concerns: no  Covid HIV Hep C Colon sr  Had a recent hip replacement d  Was on Cymbalta - was helping with pain.  Gained 30 pounds in 6 months  Wt Readings from Last 3 Encounters:  12/01/21 (!) 346 lb 4 oz (157.1 kg)  06/16/21 (!) 336 lb (152.4 kg)  10/25/20 (!) 317 lb 7.4 oz (144 kg)    Not interested in bariatric surgery    Health Maintenance  Topic Date Due   COVID-19 Vaccine (1) Never done   HIV Screening  Never done   Hepatitis C Screening  Never done   COLONOSCOPY (Pts 45-3yrs Insurance coverage will need to be confirmed)  Never done   INFLUENZA VACCINE  01/17/2022   TETANUS/TDAP  03/01/2026   HPV VACCINES  Aged Out    Immunization History  Administered Date(s) Administered   Influenza,inj,Quad PF,6+ Mos 04/14/2013, 03/27/2014, 04/07/2015, 03/01/2016, 03/15/2017, 03/20/2018, 07/14/2019, 08/12/2020   Tdap 03/01/2016   Patient Active Problem List   Diagnosis Date Noted   Dysmenorrhea 03/06/2017    Priority: Medium    Sleep apnea, obstructive     Priority: Medium    Tobacco user     Priority: Medium    Essential hypertension      Priority: Medium    S/P hip replacement 11/04/2020   Morbid obesity with body mass index (BMI) of 50.0 to 59.9 in adult [facility]) 04/15/2013    Past Medical History:  Diagnosis Date   GERD (gastroesophageal reflux disease)    H/O   Hypertension    Morbid obesity with BMI of 50.0-59.9, adult ([place])    Sleep apnea, obstructive    CPAP   Tobacco abuse     Past Surgical History:  Procedure Laterality Date   Breast Biospy  2013   BREAST SURGERY     CESAREAN SECTION     CHOLECYSTECTOMY N/A 11/17/2014   Procedure: LAPAROSCOPIC CHOLECYSTECTOMY WITH INTRAOPERATIVE CHOLANGIOGRAM;  Surgeon: Dia Crawford III, MD;  Location: [facility] ORS;  Service: General;  Laterality: N/A;   DILATION AND CURETTAGE OF UTERUS     Miscarriage  2008   TOTAL HIP ARTHROPLASTY Right 11/04/2020   Procedure: TOTAL HIP ARTHROPLASTY ANTERIOR APPROACH;  Surgeon: Hessie Knows, MD;  Location: [facility] ORS;  Service: Orthopedics;  Laterality: Right;   WISDOM TOOTH EXTRACTION      Family History  Problem Relation Age of Onset   Arthritis Mother    Alcohol abuse Father    Arthritis Father    Hyperlipidemia Father    Hypertension Father    Diabetes Father    Leukemia Father    Personality disorder Sister    Anxiety disorder Sister  Diabetes Maternal Grandmother    Lung cancer Maternal Grandfather    Alcohol abuse Paternal Grandmother    Breast cancer Paternal Grandmother    Alcohol abuse Paternal Grandfather    Arthritis Paternal Grandfather    Heart disease Paternal Grandfather    Hypertension Sister    Hypertension Brother    Ovarian cancer Neg Hx    Colon cancer Neg Hx     Social History   Social History Narrative   Not on file    Past Medical History, Surgical History, Social History, Family History, Problem List, Medications, and Allergies have been reviewed and updated if relevant.  Review of Systems: Pertinent positives are listed above.  Otherwise, a full 14 point review of systems has been done in full and  it is negative except where it is noted positive.  Objective:   BP 120/76   Pulse 86   Temp 98.9 F (37.2 C) (Oral)   Ht 5' 5.75" (1.67 m)   Wt (!) 346 lb 4 oz (157.1 kg)   SpO2 96%   BMI 56.31 kg/m  Ideal Body Weight: Weight in (lb) to have BMI = 25: 153.4 No results found.    12/01/2021    2:11 PM 08/12/2020    8:31 AM 07/14/2019    2:11 PM 03/20/2018   10:49 AM 03/15/2017   12:09 PM  Depression screen PHQ 2/9  Decreased Interest 0 0 0 0 0  Down, Depressed, Hopeless 0 0 0 0 0  PHQ - 2 Score 0 0 0 0 0     GEN: well developed, well nourished, no acute distress Eyes: conjunctiva and lids normal, PERRLA, EOMI ENT: TM clear, nares clear, oral exam WNL Neck: supple, no lymphadenopathy, no thyromegaly, no JVD Pulm: clear to auscultation and percussion, respiratory effort normal CV: regular rate and rhythm, S1-S2, no murmur, rub or gallop, no bruits Chest: no scars, masses, no lumps BREAST: breast exam declined GI: soft, non-tender; no hepatosplenomegaly, masses; active bowel sounds all quadrants GU: GU exam declined Lymph: no cervical, axillary or inguinal adenopathy MSK: gait normal, muscle tone and strength WNL, no joint swelling, effusions, discoloration, crepitus  SKIN: clear, good turgor, color WNL, no rashes, lesions, or ulcerations Neuro: normal mental status, normal strength, sensation, and motion Psych: alert; oriented to person, place and time, normally interactive and not anxious or depressed in appearance.   All labs reviewed with patient. Results for orders placed or performed in visit on 11/24/21  TSH  Result Value Ref Range   TSH 2.07 0.35 - 5.50 uIU/mL  VITAMIN D 25 Hydroxy (Vit-D Deficiency, Fractures)  Result Value Ref Range   VITD 10.34 (L) 30.00 - 100.00 ng/mL  Basic metabolic panel  Result Value Ref Range   Sodium 138 135 - 145 mEq/L   Potassium 4.3 3.5 - 5.1 mEq/L   Chloride 101 96 - 112 mEq/L   CO2 26 19 - 32 mEq/L   Glucose, Bld 102 (H) 70 -  99 mg/dL   BUN 22 6 - 23 mg/dL   Creatinine, Ser 9.98 0.40 - 1.20 mg/dL   GFR 33.82 >50.53 mL/min   Calcium 9.8 8.4 - 10.5 mg/dL  CBC with Differential/Platelet  Result Value Ref Range   WBC 10.2 4.0 - 10.5 K/uL   RBC 4.14 3.87 - 5.11 Mil/uL   Hemoglobin 13.6 12.0 - 15.0 g/dL   HCT 97.6 73.4 - 19.3 %   MCV 97.4 78.0 - 100.0 fl   MCHC 33.8 30.0 - 36.0 g/dL  RDW 13.2 11.5 - 15.5 %   Platelets 305.0 150.0 - 400.0 K/uL   Neutrophils Relative % 52.9 43.0 - 77.0 %   Lymphocytes Relative 35.6 12.0 - 46.0 %   Monocytes Relative 6.3 3.0 - 12.0 %   Eosinophils Relative 4.3 0.0 - 5.0 %   Basophils Relative 0.9 0.0 - 3.0 %   Neutro Abs 5.4 1.4 - 7.7 K/uL   Lymphs Abs 3.6 0.7 - 4.0 K/uL   Monocytes Absolute 0.6 0.1 - 1.0 K/uL   Eosinophils Absolute 0.4 0.0 - 0.7 K/uL   Basophils Absolute 0.1 0.0 - 0.1 K/uL  Hemoglobin A1c  Result Value Ref Range   Hgb A1c MFr Bld 5.6 4.6 - 6.5 %  Hepatic function panel  Result Value Ref Range   Total Bilirubin 0.4 0.2 - 1.2 mg/dL   Bilirubin, Direct 0.1 0.0 - 0.3 mg/dL   Alkaline Phosphatase 62 39 - 117 U/L   AST 13 0 - 37 U/L   ALT 16 0 - 35 U/L   Total Protein 7.1 6.0 - 8.3 g/dL   Albumin 4.3 3.5 - 5.2 g/dL  Lipid panel  Result Value Ref Range   Cholesterol 267 (H) 0 - 200 mg/dL   Triglycerides 311.0 (H) 0.0 - 149.0 mg/dL   HDL 45.00 >39.00 mg/dL   VLDL 62.2 (H) 0.0 - 40.0 mg/dL   Total CHOL/HDL Ratio 6    NonHDL 221.61   LDL cholesterol, direct  Result Value Ref Range   Direct LDL 173.0 mg/dL   No results found.  Assessment and Plan:     ICD-10-CM   1. Healthcare maintenance  Z00.00       Health Maintenance Exam: The patient's preventative maintenance and recommended screening tests for an annual wellness exam were reviewed in full today. Brought up to date unless services declined.  Counselled on the importance of diet, exercise, and its role in overall health and mortality. The patient's FH and SH was reviewed, including their  home life, tobacco status, and drug and alcohol status.  Follow-up in 1 year for physical exam or additional follow-up below.  Follow-up: No follow-ups on file. Or follow-up in 1 year if not noted.  Future Appointments  Date Time Provider [place]  12/12/2021  3:20 PM [facility]    No orders of the defined types were placed in this encounter.  Medications Discontinued During This Encounter  Medication Reason   enoxaparin (LOVENOX) 40 MG/0.4ML injection Completed Course   methocarbamol (ROBAXIN) 500 MG tablet    methylPREDNISolone (MEDROL DOSEPAK) 4 MG TBPK tablet    diphenhydrAMINE (BENADRYL) 50 MG tablet Completed Course   No orders of the defined types were placed in this encounter.   Signed,  Maud Deed. Johnnetta Holstine, MD   Allergies as of 12/01/2021       Reactions   Levofloxacin Other (See Comments)   Joint pain        Medication List        Accurate as of December 01, 2021  2:15 PM. If you have any questions, ask your nurse or doctor.          STOP taking these medications    diphenhydrAMINE 50 MG tablet Commonly known as: BENADRYL Stopped by: Owens Loffler, MD   enoxaparin 40 MG/0.4ML injection Commonly known as: LOVENOX Stopped by: Owens Loffler, MD   methocarbamol 500 MG tablet Commonly known as: ROBAXIN Stopped by: Owens Loffler, MD   methylPREDNISolone 4 MG Tbpk tablet  Commonly known as: MEDROL DOSEPAK Stopped by: Hannah Beat, MD       TAKE these medications    acetaminophen 650 MG CR tablet Commonly known as: TYLENOL Take 1,300 mg by mouth in the morning, at noon, and at bedtime.   diclofenac 75 MG EC tablet Commonly known as: VOLTAREN TAKE 1 TABLET(75 MG) BY MOUTH TWICE DAILY   docusate sodium 100 MG capsule Commonly known as: COLACE Take 1 capsule (100 mg total) by mouth 2 (two) times daily.   DULoxetine HCl 40 MG Cpep Take 1 capsule by mouth daily.   lisinopril-hydrochlorothiazide 10-12.5 MG  tablet Commonly known as: ZESTORETIC TAKE 1 TABLET BY MOUTH DAILY   MELATONIN PO Take 15 mg by mouth at bedtime.   terbinafine 1 % cream Commonly known as: LAMISIL Apply 1 application topically as needed. YEAST INFECTION (UNDER ABDOMINAL FOLDS)

## 2021-12-01 NOTE — Patient Instructions (Signed)
Vitamin 2:  4,000 units a day

## 2021-12-05 ENCOUNTER — Other Ambulatory Visit: Payer: Self-pay

## 2021-12-05 DIAGNOSIS — Z1211 Encounter for screening for malignant neoplasm of colon: Secondary | ICD-10-CM

## 2021-12-05 MED ORDER — PEG 3350-KCL-NA BICARB-NACL 420 G PO SOLR: 4000.0000 mL | Freq: Once | ORAL | 0 refills | Status: AC

## 2021-12-05 NOTE — Progress Notes (Signed)
Gastroenterology Pre-Procedure Review Will bring back insurance card  Request Date: 03/17/2022 Requesting Physician: Dr. Allegra Lai  PATIENT REVIEW QUESTIONS: The patient responded to the following health history questions as indicated:    1. Are you having any GI issues? no 2. Do you have a personal history of Polyps? no 3. Do you have a family history of Colon Cancer or Polyps? no 4. Diabetes Mellitus? no 5. Joint replacements in the past 12 months?yes (may 17 right hip) 6. Major health problems in the past 3 months?no 7. Any artificial heart valves, MVP, or defibrillator?no    MEDICATIONS & ALLERGIES:    Patient reports the following regarding taking any anticoagulation/antiplatelet therapy:   Plavix, Coumadin, Eliquis, Xarelto, Lovenox, Pradaxa, Brilinta, or Effient? no Aspirin? no  Patient confirms/reports the following medications:  Current Outpatient Medications  Medication Sig Dispense Refill   acetaminophen (TYLENOL) 650 MG CR tablet Take 1,300 mg by mouth in the morning, at noon, and at bedtime.     diclofenac (VOLTAREN) 75 MG EC tablet TAKE 1 TABLET(75 MG) BY MOUTH TWICE DAILY 180 tablet 1   docusate sodium (COLACE) 100 MG capsule Take 1 capsule (100 mg total) by mouth 2 (two) times daily. 10 capsule 0   DULoxetine HCl 40 MG CPEP Take 1 capsule by mouth daily. 90 capsule 3   lisinopril-hydrochlorothiazide (ZESTORETIC) 10-12.5 MG tablet TAKE 1 TABLET BY MOUTH DAILY 90 tablet 3   MELATONIN PO Take 15 mg by mouth at bedtime.     terbinafine (LAMISIL) 1 % cream Apply 1 application topically as needed. YEAST INFECTION (UNDER ABDOMINAL FOLDS)     No current facility-administered medications for this visit.    Patient confirms/reports the following allergies:  Allergies  Allergen Reactions   Levofloxacin Other (See Comments)    Joint pain    No orders of the defined types were placed in this encounter.   AUTHORIZATION INFORMATION Primary Insurance: 1D#: Group  #:  Secondary Insurance: 1D#: Group #:  SCHEDULE INFORMATION: Date: 03/17/2022 Time: Location:[facility]

## 2021-12-08 ENCOUNTER — Other Ambulatory Visit: Payer: Self-pay | Admitting: Family Medicine

## 2021-12-12 ENCOUNTER — Ambulatory Visit
Admission: RE | Admit: 2021-12-12 | Discharge: 2021-12-12 | Disposition: A | Discharge status: Home / Self Care | Source: Ambulatory Visit | Attending: Family Medicine | Admitting: Family Medicine

## 2021-12-12 DIAGNOSIS — Z1231 Encounter for screening mammogram for malignant neoplasm of breast: Secondary | ICD-10-CM | POA: Diagnosis present

## 2022-03-06 ENCOUNTER — Other Ambulatory Visit: Payer: Self-pay | Admitting: Family Medicine

## 2022-03-06 NOTE — Telephone Encounter (Signed)
Last office visit 12/01/21 for CPE.  Last refilled 09/02/21 for #180 with 1 refill.  No future appointments.

## 2022-03-15 ENCOUNTER — Telehealth: Payer: Self-pay | Admitting: Gastroenterology

## 2022-03-15 NOTE — Telephone Encounter (Signed)
Penny in Endo notified of patients cancellation request.  Thanks,  Sharyn Lull, [place]

## 2022-03-15 NOTE — Telephone Encounter (Signed)
Per pt message would like to cancel colonoscopy and will call later to r/s. I left pt a message to let pt know we got message.

## 2022-03-17 ENCOUNTER — Encounter: Admission: RE | Payer: Self-pay | Source: Home / Self Care

## 2022-03-17 ENCOUNTER — Ambulatory Visit: Admission: RE | Admit: 2022-03-17 | Source: Home / Self Care | Admitting: Gastroenterology

## 2022-03-17 SURGERY — COLONOSCOPY WITH PROPOFOL
Anesthesia: General

## 2022-11-24 ENCOUNTER — Other Ambulatory Visit: Payer: Self-pay | Admitting: Family Medicine

## 2022-11-26 ENCOUNTER — Other Ambulatory Visit: Payer: Self-pay | Admitting: Family Medicine

## 2022-12-01 ENCOUNTER — Telehealth: Payer: Self-pay | Admitting: Family Medicine

## 2022-12-01 ENCOUNTER — Other Ambulatory Visit (HOSPITAL_COMMUNITY): Payer: Self-pay

## 2022-12-01 NOTE — Telephone Encounter (Signed)
[employer] call stating that patient needs a PA for medication  DULoxetine HCl 40 MG CPEP.

## 2022-12-01 NOTE — Telephone Encounter (Signed)
Does pt require delayed release capsules?

## 2022-12-01 NOTE — Telephone Encounter (Signed)
I think that it only comes in delay-release capsules.  It is possible that they prefer the 20 mg capsules, so I could do 2 x 20 mg capsules if insurance prefers.  I think that the 20, 30, 60 caps are generic - maybe not the 40's.

## 2022-12-03 ENCOUNTER — Other Ambulatory Visit: Payer: Self-pay | Admitting: Family Medicine

## 2022-12-03 DIAGNOSIS — E559 Vitamin D deficiency, unspecified: Secondary | ICD-10-CM

## 2022-12-03 DIAGNOSIS — E785 Hyperlipidemia, unspecified: Secondary | ICD-10-CM

## 2022-12-03 DIAGNOSIS — Z79899 Other long term (current) drug therapy: Secondary | ICD-10-CM

## 2022-12-03 DIAGNOSIS — Z131 Encounter for screening for diabetes mellitus: Secondary | ICD-10-CM

## 2022-12-04 ENCOUNTER — Other Ambulatory Visit (HOSPITAL_COMMUNITY): Payer: Self-pay

## 2022-12-04 MED ORDER — DULOXETINE HCL 20 MG PO CPEP
40.0000 mg | ORAL_CAPSULE | Freq: Every day | ORAL | 3 refills | Status: DC
Start: 2022-12-04 — End: 2024-01-07

## 2022-12-04 NOTE — Telephone Encounter (Signed)
I don't know what I was thinking, thank you for catching that. You are correct! I checked for coverage on the 20 mg capsules and if written as 2 of the 20 mg DR capsules daily, it should be $10.00.

## 2022-12-04 NOTE — Addendum Note (Signed)
Addended by: Hannah Beat on: 12/04/2022 10:18 AM   Modules accepted: Orders

## 2022-12-04 NOTE — Telephone Encounter (Signed)
Done

## 2022-12-07 ENCOUNTER — Other Ambulatory Visit: Payer: BC Managed Care – PPO

## 2022-12-07 DIAGNOSIS — E785 Hyperlipidemia, unspecified: Secondary | ICD-10-CM | POA: Diagnosis not present

## 2022-12-07 DIAGNOSIS — Z131 Encounter for screening for diabetes mellitus: Secondary | ICD-10-CM

## 2022-12-07 DIAGNOSIS — Z79899 Other long term (current) drug therapy: Secondary | ICD-10-CM | POA: Diagnosis not present

## 2022-12-07 DIAGNOSIS — E559 Vitamin D deficiency, unspecified: Secondary | ICD-10-CM | POA: Diagnosis not present

## 2022-12-07 LAB — CBC WITH DIFFERENTIAL/PLATELET
Basophils Absolute: 0.1 10*3/uL (ref 0.0–0.1)
Basophils Relative: 0.8 % (ref 0.0–3.0)
Eosinophils Absolute: 0.3 10*3/uL (ref 0.0–0.7)
Eosinophils Relative: 2.8 % (ref 0.0–5.0)
HCT: 41 % (ref 36.0–46.0)
Hemoglobin: 13.6 g/dL (ref 12.0–15.0)
Lymphocytes Relative: 28.7 % (ref 12.0–46.0)
Lymphs Abs: 3.2 10*3/uL (ref 0.7–4.0)
MCHC: 33.2 g/dL (ref 30.0–36.0)
MCV: 94.9 fl (ref 78.0–100.0)
Monocytes Absolute: 0.7 10*3/uL (ref 0.1–1.0)
Monocytes Relative: 6.3 % (ref 3.0–12.0)
Neutro Abs: 6.8 10*3/uL (ref 1.4–7.7)
Neutrophils Relative %: 61.4 % (ref 43.0–77.0)
Platelets: 325 10*3/uL (ref 150.0–400.0)
RBC: 4.32 Mil/uL (ref 3.87–5.11)
RDW: 13.1 % (ref 11.5–15.5)
WBC: 11 10*3/uL — ABNORMAL HIGH (ref 4.0–10.5)

## 2022-12-07 LAB — BASIC METABOLIC PANEL
BUN: 19 mg/dL (ref 6–23)
CO2: 28 mEq/L (ref 19–32)
Calcium: 9.6 mg/dL (ref 8.4–10.5)
Chloride: 102 mEq/L (ref 96–112)
Creatinine, Ser: 0.94 mg/dL (ref 0.40–1.20)
GFR: 72.5 mL/min (ref >60.00)
Glucose, Bld: 105 mg/dL — ABNORMAL HIGH (ref 70–99)
Potassium: 4.1 mEq/L (ref 3.5–5.1)
Sodium: 138 mEq/L (ref 135–145)

## 2022-12-07 LAB — HEPATIC FUNCTION PANEL
ALT: 18 U/L (ref 0–35)
AST: 15 U/L (ref 0–37)
Albumin: 4.1 g/dL (ref 3.5–5.2)
Alkaline Phosphatase: 67 U/L (ref 39–117)
Bilirubin, Direct: 0.1 mg/dL (ref 0.0–0.3)
Total Bilirubin: 0.4 mg/dL (ref 0.2–1.2)
Total Protein: 7.6 g/dL (ref 6.0–8.3)

## 2022-12-07 LAB — LDL CHOLESTEROL, DIRECT: Direct LDL: 154 mg/dL

## 2022-12-07 LAB — LIPID PANEL
Cholesterol: 230 mg/dL — ABNORMAL HIGH (ref 0–200)
HDL: 40.3 mg/dL (ref >39.00)
NonHDL: 190.02
Total CHOL/HDL Ratio: 6
Triglycerides: 248 mg/dL — ABNORMAL HIGH (ref 0.0–149.0)
VLDL: 49.6 mg/dL — ABNORMAL HIGH (ref 0.0–40.0)

## 2022-12-07 LAB — TSH: TSH: 2.22 u[IU]/mL (ref 0.35–5.50)

## 2022-12-07 LAB — VITAMIN D 25 HYDROXY (VIT D DEFICIENCY, FRACTURES): VITD: 45 ng/mL (ref 30.00–100.00)

## 2022-12-07 LAB — HEMOGLOBIN A1C: Hgb A1c MFr Bld: 5.7 % (ref 4.6–6.5)

## 2022-12-14 ENCOUNTER — Encounter: Payer: Self-pay | Admitting: Family Medicine

## 2023-01-17 ENCOUNTER — Encounter (INDEPENDENT_AMBULATORY_CARE_PROVIDER_SITE_OTHER): Payer: Self-pay

## 2023-02-05 ENCOUNTER — Telehealth: Payer: Self-pay

## 2023-02-05 NOTE — Telephone Encounter (Signed)
Sending to [facility] support. 

## 2023-02-08 ENCOUNTER — Encounter: Payer: Self-pay | Admitting: Family Medicine

## 2023-03-01 ENCOUNTER — Other Ambulatory Visit: Payer: Self-pay | Admitting: Family Medicine

## 2023-03-01 NOTE — Telephone Encounter (Signed)
Last office visit 12/01/2021 for CPE.  Last refilled 03/06/22 for #180 with 3 refills.  Next appt: CPE 04/05/23.

## 2023-04-05 ENCOUNTER — Encounter: Payer: Self-pay | Admitting: Family Medicine

## 2023-12-06 ENCOUNTER — Other Ambulatory Visit: Payer: Self-pay | Admitting: Family Medicine

## 2024-01-03 ENCOUNTER — Other Ambulatory Visit: Payer: Self-pay | Admitting: Family Medicine

## 2024-01-03 NOTE — Telephone Encounter (Signed)
 Please schedule CPE with fasting labs prior with Dr. Watt.  Send back to me to refill medication once scheduled.

## 2024-01-09 ENCOUNTER — Telehealth: Payer: Self-pay | Admitting: *Deleted

## 2024-01-09 DIAGNOSIS — E559 Vitamin D deficiency, unspecified: Secondary | ICD-10-CM

## 2024-01-09 DIAGNOSIS — Z1159 Encounter for screening for other viral diseases: Secondary | ICD-10-CM

## 2024-01-09 DIAGNOSIS — E785 Hyperlipidemia, unspecified: Secondary | ICD-10-CM

## 2024-01-09 DIAGNOSIS — Z131 Encounter for screening for diabetes mellitus: Secondary | ICD-10-CM

## 2024-01-09 DIAGNOSIS — Z114 Encounter for screening for human immunodeficiency virus [HIV]: Secondary | ICD-10-CM

## 2024-01-09 DIAGNOSIS — Z79899 Other long term (current) drug therapy: Secondary | ICD-10-CM

## 2024-01-09 NOTE — Telephone Encounter (Signed)
-----   Message from Veva JINNY Ferrari sent at 01/08/2024 10:53 AM EDT ----- Regarding: Lab orders for [name], 8.4.25 Patient is scheduled for CPX labs, please order future labs, Thanks , Veva

## 2024-01-21 ENCOUNTER — Other Ambulatory Visit (INDEPENDENT_AMBULATORY_CARE_PROVIDER_SITE_OTHER)

## 2024-01-21 DIAGNOSIS — E559 Vitamin D deficiency, unspecified: Secondary | ICD-10-CM

## 2024-01-21 DIAGNOSIS — Z114 Encounter for screening for human immunodeficiency virus [HIV]: Secondary | ICD-10-CM

## 2024-01-21 DIAGNOSIS — E785 Hyperlipidemia, unspecified: Secondary | ICD-10-CM | POA: Diagnosis not present

## 2024-01-21 DIAGNOSIS — Z1159 Encounter for screening for other viral diseases: Secondary | ICD-10-CM | POA: Diagnosis not present

## 2024-01-21 DIAGNOSIS — Z79899 Other long term (current) drug therapy: Secondary | ICD-10-CM | POA: Diagnosis not present

## 2024-01-21 DIAGNOSIS — Z131 Encounter for screening for diabetes mellitus: Secondary | ICD-10-CM

## 2024-01-21 LAB — CBC WITH DIFFERENTIAL/PLATELET
Basophils Absolute: 0.1 K/uL (ref 0.0–0.1)
Basophils Relative: 0.9 % (ref 0.0–3.0)
Eosinophils Absolute: 0.3 K/uL (ref 0.0–0.7)
Eosinophils Relative: 2.8 % (ref 0.0–5.0)
HCT: 40.4 % (ref 36.0–46.0)
Hemoglobin: 13.6 g/dL (ref 12.0–15.0)
Lymphocytes Relative: 32.4 % (ref 12.0–46.0)
Lymphs Abs: 3.7 K/uL (ref 0.7–4.0)
MCHC: 33.6 g/dL (ref 30.0–36.0)
MCV: 93.5 fl (ref 78.0–100.0)
Monocytes Absolute: 0.6 K/uL (ref 0.1–1.0)
Monocytes Relative: 5.3 % (ref 3.0–12.0)
Neutro Abs: 6.6 K/uL (ref 1.4–7.7)
Neutrophils Relative %: 58.6 % (ref 43.0–77.0)
Platelets: 350 K/uL (ref 150.0–400.0)
RBC: 4.32 Mil/uL (ref 3.87–5.11)
RDW: 13.3 % (ref 11.5–15.5)
WBC: 11.3 K/uL — ABNORMAL HIGH (ref 4.0–10.5)

## 2024-01-21 LAB — BASIC METABOLIC PANEL WITH GFR
BUN: 18 mg/dL (ref 6–23)
CO2: 30 meq/L (ref 19–32)
Calcium: 9.7 mg/dL (ref 8.4–10.5)
Chloride: 98 meq/L (ref 96–112)
Creatinine, Ser: 0.84 mg/dL (ref 0.40–1.20)
GFR: 82.33 mL/min (ref >60.00)
Glucose, Bld: 97 mg/dL (ref 70–99)
Potassium: 4.6 meq/L (ref 3.5–5.1)
Sodium: 137 meq/L (ref 135–145)

## 2024-01-21 LAB — LIPID PANEL
Cholesterol: 241 mg/dL — ABNORMAL HIGH (ref 0–200)
HDL: 42 mg/dL (ref >39.00)
LDL Cholesterol: 153 mg/dL — ABNORMAL HIGH (ref 0–99)
NonHDL: 199.02
Total CHOL/HDL Ratio: 6
Triglycerides: 231 mg/dL — ABNORMAL HIGH (ref 0.0–149.0)
VLDL: 46.2 mg/dL — ABNORMAL HIGH (ref 0.0–40.0)

## 2024-01-21 LAB — HEMOGLOBIN A1C: Hgb A1c MFr Bld: 5.9 % (ref 4.6–6.5)

## 2024-01-21 LAB — HEPATIC FUNCTION PANEL
ALT: 14 U/L (ref 0–35)
AST: 13 U/L (ref 0–37)
Albumin: 4.2 g/dL (ref 3.5–5.2)
Alkaline Phosphatase: 64 U/L (ref 39–117)
Bilirubin, Direct: 0.1 mg/dL (ref 0.0–0.3)
Total Bilirubin: 0.4 mg/dL (ref 0.2–1.2)
Total Protein: 7.3 g/dL (ref 6.0–8.3)

## 2024-01-21 LAB — VITAMIN D 25 HYDROXY (VIT D DEFICIENCY, FRACTURES): VITD: 38.21 ng/mL (ref 30.00–100.00)

## 2024-01-21 LAB — TSH: TSH: 1.87 u[IU]/mL (ref 0.35–5.50)

## 2024-01-22 LAB — HIV ANTIBODY (ROUTINE TESTING W REFLEX): HIV 1&2 Ab, 4th Generation: NONREACTIVE

## 2024-01-22 LAB — HEPATITIS C ANTIBODY: Hepatitis C Ab: NONREACTIVE

## 2024-01-28 ENCOUNTER — Encounter: Payer: Self-pay | Admitting: Family Medicine

## 2024-01-28 ENCOUNTER — Ambulatory Visit (INDEPENDENT_AMBULATORY_CARE_PROVIDER_SITE_OTHER): Payer: Self-pay | Admitting: Family Medicine

## 2024-01-28 ENCOUNTER — Ambulatory Visit (INDEPENDENT_AMBULATORY_CARE_PROVIDER_SITE_OTHER)
Admission: RE | Admit: 2024-01-28 | Discharge: 2024-01-28 | Disposition: A | Discharge status: Home / Self Care | Source: Ambulatory Visit | Attending: Family Medicine | Admitting: Family Medicine

## 2024-01-28 VITALS — BP 140/86 | HR 100 | Temp 98.6°F | Ht 65.0 in | Wt 375.0 lb

## 2024-01-28 DIAGNOSIS — Z0000 Encounter for general adult medical examination without abnormal findings: Secondary | ICD-10-CM

## 2024-01-28 DIAGNOSIS — M19012 Primary osteoarthritis, left shoulder: Secondary | ICD-10-CM | POA: Diagnosis not present

## 2024-01-28 DIAGNOSIS — G8929 Other chronic pain: Secondary | ICD-10-CM

## 2024-01-28 DIAGNOSIS — Z1211 Encounter for screening for malignant neoplasm of colon: Secondary | ICD-10-CM

## 2024-01-28 DIAGNOSIS — M25512 Pain in left shoulder: Secondary | ICD-10-CM

## 2024-01-28 DIAGNOSIS — Z720 Tobacco use: Secondary | ICD-10-CM

## 2024-01-28 DIAGNOSIS — E782 Mixed hyperlipidemia: Secondary | ICD-10-CM

## 2024-01-28 MED ORDER — LISINOPRIL-HYDROCHLOROTHIAZIDE 20-12.5 MG PO TABS: 1.0000 | ORAL_TABLET | Freq: Every day | ORAL | 3 refills | Status: AC

## 2024-01-28 NOTE — Progress Notes (Signed)
 Andrea Tipping T. Shatha Hooser, Andrea Hull, Andrea Hull [facility] at [facility] [address] [place], [address]  Phone: [phone]  FAX: [phone]  Andrea Hull - 48 y.o. female  MRN [id number]  Date of Birth: 1976/01/10  Date: 01/28/2024  PCP: Watt Mirza, Andrea Hull  Referral: Watt Mirza, Andrea Hull  Chief Complaint  Patient presents with   Annual Exam   Patient Care Team: Watt Mirza, Andrea Hull as PCP - General (Family Hull) Subjective:   Andrea Hull is a 48 y.o. pleasant patient who presents with the following:  Health Maintenance Summary Reviewed and updated, unless pt declines services.  Tobacco History Reviewed. Non-smoker, but she continues to vape Alcohol: No concerns, no excessive use Exercise Habits: Some activity, rec at least 30 mins 5 times a week -limited right now STD concerns: none Drug Use: None Lumps or breast concerns: no  Colon vs cologuard Covid Flu?  Dad with cancer Constant L shoulder pain - waking up all the time  Patient has chronic left-sided shoulder pain, she is limited with her motion in the shoulder quite a bit.  This is a day-to-day ongoing chronic deep dull ache and pain.  She has loss of functional strength and movement.  Son [name], lot of financial issues Husband retired from the [employer]  Tylenol  off and on  The 10-year ASCVD risk score (Arnett DK, et al., 2019) is: 3.2%   Values used to calculate the score:     Age: 45 years     Clincally relevant sex: Female     Is Non-Hispanic African American: No     Diabetic: No     Tobacco smoker: No     Systolic Blood Pressure: 140 mmHg     Is BP treated: Yes     HDL Cholesterol: 42 mg/dL     Total Cholesterol: 241 mg/dL   Wt Readings from Last 3 Encounters:  01/28/24 (!) 375 lb (170.1 kg)  12/01/21 (!) 346 lb 4 oz (157.1 kg)  06/16/21 (!) 336 lb (152.4 kg)      Health Maintenance  Topic Date Due   Colonoscopy  Never done   COVID-19  Vaccine (1) 02/14/2024 (Originally 11/25/1980)   INFLUENZA VACCINE  09/16/2024 (Originally 01/18/2024)   Hepatitis B Vaccines (1 of 3 - 19+ 3-dose series) 01/28/2025 (Originally 11/26/1994)   DTaP/Tdap/Td (2 - Td or Tdap) 03/01/2026   Hepatitis C Screening  Completed   HIV Screening  Completed   HPV VACCINES  Aged Out   Meningococcal B Vaccine  Aged Out    Immunization History  Administered Date(s) Administered   Influenza,inj,Quad PF,6+ Mos 04/14/2013, 03/27/2014, 04/07/2015, 03/01/2016, 03/15/2017, 03/20/2018, 07/14/2019, 08/12/2020   Tdap 03/01/2016   Patient Active Problem List   Diagnosis Date Noted   Morbid obesity with BMI of 60.0-69.9, adult (HCC) 04/15/2013    Priority: High   Essential hypertension     Priority: High   Vapes nicotine containing substance 01/29/2024    Priority: Medium    Dysmenorrhea 03/06/2017    Priority: Medium    Sleep apnea, obstructive     Priority: Medium    Former cigarette smoker     Priority: Medium    Glenohumeral arthritis, left, Severe, End-stage 01/29/2024    Past Medical History:  Diagnosis Date   Former cigarette smoker    GERD (gastroesophageal reflux disease)    Hypertension    Morbid obesity with BMI of 60.0-69.9, adult (HCC)    OSA on  CPAP    Vapes nicotine containing substance 01/29/2024    Past Surgical History:  Procedure Laterality Date   Breast Biospy  2013   BREAST SURGERY     CESAREAN SECTION     CHOLECYSTECTOMY N/A 11/17/2014   Procedure: LAPAROSCOPIC CHOLECYSTECTOMY WITH INTRAOPERATIVE CHOLANGIOGRAM;  Surgeon: Elgin Laurence III, Andrea Hull;  Location: [facility] ORS;  Service: General;  Laterality: N/A;   DILATION AND CURETTAGE OF UTERUS     Miscarriage  2008   TOTAL HIP ARTHROPLASTY Right 11/04/2020   Procedure: TOTAL HIP ARTHROPLASTY ANTERIOR APPROACH;  Surgeon: Kathlynn Sharper, Andrea Hull;  Location: [facility] ORS;  Service: Orthopedics;  Laterality: Right;   WISDOM TOOTH EXTRACTION      Family History  Problem Relation Age of Onset    Arthritis Mother    Alcohol abuse Father    Arthritis Father    Hyperlipidemia Father    Hypertension Father    Diabetes Father    Leukemia Father    Personality disorder Sister    Anxiety disorder Sister    Diabetes Maternal Grandmother    Lung cancer Maternal Grandfather    Alcohol abuse Paternal Grandmother    Breast cancer Paternal Grandmother    Alcohol abuse Paternal Grandfather    Arthritis Paternal Grandfather    Heart disease Paternal Grandfather    Hypertension Sister    Hypertension Brother    Ovarian cancer Neg Hx    Colon cancer Neg Hx     Social History   Social History Narrative   Not on file    Past Medical History, Surgical History, Social History, Family History, Problem List, Medications, and Allergies have been reviewed and updated if relevant.  Review of Systems: Pertinent positives are listed above.  Otherwise, a full 14 point review of systems has been done in full and it is negative except where it is noted positive.  Objective:   BP (!) 140/86   Pulse 100   Temp 98.6 F (37 C) (Oral)   Ht 5' 5 (1.651 m)   Wt (!) 375 lb (170.1 kg)   LMP 04/27/2020 (Approximate)   SpO2 95%   BMI 62.40 kg/m  Ideal Body Weight: Weight in (lb) to have BMI = 25: 149.9 No results found.    01/28/2024   11:18 AM 12/01/2021    2:11 PM 08/12/2020    8:31 AM 07/14/2019    2:11 PM 03/20/2018   10:49 AM  Depression screen PHQ 2/9  Decreased Interest 0 0 0 0 0  Down, Depressed, Hopeless 0 0 0 0 0  PHQ - 2 Score 0 0 0 0 0     GEN: well developed, well nourished, no acute distress Eyes: conjunctiva and lids normal, PERRLA, EOMI ENT: TM clear, nares clear, oral exam WNL Neck: supple, no lymphadenopathy, no thyromegaly, no JVD Pulm: clear to auscultation and percussion, respiratory effort normal CV: regular rate and rhythm, S1-S2, no murmur, rub or gallop, no bruits Chest: no scars, masses, no lumps BREAST: breast exam declined GI: soft, non-tender; no  hepatosplenomegaly, masses; active bowel sounds all quadrants GU: GU exam declined Lymph: no cervical, axillary or inguinal adenopathy MSK: gait normal, muscle tone and strength WNL, no joint swelling, effusions, discoloration, crepitus   Left shoulder: Nontender along the clavicle, mild tenderness at the [facility] joint She is able to abduct the shoulder and flex his shoulder to 90 degrees Minimal ability to internally rotate the shoulder, and external rotation is also mildly limited  Strength is 4/5 throughout Sensation is  intact  All provocative maneuvers of the shoulder including Jobe, speeds, Yergason, [employer], Neer testing, crossover are all positive and limited based on the patient's loss of motion  SKIN: clear, good turgor, color WNL, no rashes, lesions, or ulcerations Neuro: normal mental status, normal strength, sensation, and motion Psych: alert; oriented to person, place and time, normally interactive and not anxious or depressed in appearance.   All labs reviewed with patient. Results for orders placed or performed in visit on 01/21/24  Hepatitis C antibody   Collection Time: 01/21/24  8:49 AM  Result Value Ref Range   Hepatitis C Ab NON-REACTIVE NON-REACTIVE  HIV Antibody (routine testing w rflx)   Collection Time: 01/21/24  8:49 AM  Result Value Ref Range   HIV FINAL INTERPRETATION     HIV 1&2 Ab, 4th Generation NON-REACTIVE NON-REACTIVE  VITAMIN D  25 Hydroxy (Vit-D Deficiency, Fractures)   Collection Time: 01/21/24  8:49 AM  Result Value Ref Range   VITD 38.21 30.00 - 100.00 ng/mL  TSH   Collection Time: 01/21/24  8:49 AM  Result Value Ref Range   TSH 1.87 0.35 - 5.50 uIU/mL  Lipid panel   Collection Time: 01/21/24  8:49 AM  Result Value Ref Range   Cholesterol 241 (H) 0 - 200 mg/dL   Triglycerides 768.9 (H) 0.0 - 149.0 mg/dL   HDL 57.99 >60.99 mg/dL   VLDL 53.7 (H) 0.0 - 59.9 mg/dL   LDL Cholesterol 846 (H) 0 - 99 mg/dL   Total CHOL/HDL Ratio 6     NonHDL 199.02   Hemoglobin A1c   Collection Time: 01/21/24  8:49 AM  Result Value Ref Range   Hgb A1c MFr Bld 5.9 4.6 - 6.5 %  Hepatic function panel   Collection Time: 01/21/24  8:49 AM  Result Value Ref Range   Total Bilirubin 0.4 0.2 - 1.2 mg/dL   Bilirubin, Direct 0.1 0.0 - 0.3 mg/dL   Alkaline Phosphatase 64 39 - 117 U/L   AST 13 0 - 37 U/L   ALT 14 0 - 35 U/L   Total Protein 7.3 6.0 - 8.3 g/dL   Albumin 4.2 3.5 - 5.2 g/dL  CBC with Differential/Platelet   Collection Time: 01/21/24  8:49 AM  Result Value Ref Range   WBC 11.3 (H) 4.0 - 10.5 K/uL   RBC 4.32 3.87 - 5.11 Mil/uL   Hemoglobin 13.6 12.0 - 15.0 g/dL   HCT 59.5 63.9 - 53.9 %   MCV 93.5 78.0 - 100.0 fl   MCHC 33.6 30.0 - 36.0 g/dL   RDW 86.6 88.4 - 84.4 %   Platelets 350.0 150.0 - 400.0 K/uL   Neutrophils Relative % 58.6 43.0 - 77.0 %   Lymphocytes Relative 32.4 12.0 - 46.0 %   Monocytes Relative 5.3 3.0 - 12.0 %   Eosinophils Relative 2.8 0.0 - 5.0 %   Basophils Relative 0.9 0.0 - 3.0 %   Neutro Abs 6.6 1.4 - 7.7 K/uL   Lymphs Abs 3.7 0.7 - 4.0 K/uL   Monocytes Absolute 0.6 0.1 - 1.0 K/uL   Eosinophils Absolute 0.3 0.0 - 0.7 K/uL   Basophils Absolute 0.1 0.0 - 0.1 K/uL  Basic metabolic panel   Collection Time: 01/21/24  8:49 AM  Result Value Ref Range   Sodium 137 135 - 145 mEq/L   Potassium 4.6 3.5 - 5.1 mEq/L   Chloride 98 96 - 112 mEq/L   CO2 30 19 - 32 mEq/L   Glucose, Bld 97 70 -  99 mg/dL   BUN 18 6 - 23 mg/dL   Creatinine, Ser 9.15 0.40 - 1.20 mg/dL   GFR 17.66 >39.99 mL/min   Calcium 9.7 8.4 - 10.5 mg/dL   No results found.  Assessment and Plan:     ICD-10-CM   1. Healthcare maintenance  Z00.00     2. Chronic left shoulder pain  M25.512 DG Shoulder Left   G89.29     3. Colon cancer screening  Z12.11 Ambulatory referral to Gastroenterology    4. Glenohumeral arthritis, left, Severe, End-stage  M19.012     5. Vapes nicotine containing substance  Z72.0      She is open to getting  colon cancer screening, so I will refer her for colonoscopy.  She declines additional vaccination  Cholesterol is elevated today.  The 10-year ASCVD risk score (Arnett DK, et al., 2019) is: 3.2%   Values used to calculate the score:     Age: 77 years     Clincally relevant sex: Female     Is Non-Hispanic African American: No     Diabetic: No     Tobacco smoker: No     Systolic Blood Pressure: 140 mmHg     Is BP treated: Yes     HDL Cholesterol: 42 mg/dL     Total Cholesterol: 241 mg/dL   I reviewed the patient's radiographs with her, and she has complete loss of joint space at the glenohumeral joint.  I discussed with her and was fairly frank, I have this is some of the worst glenohumeral joint arthritis have ever seen in a young patient.  I doubt anything short of the total shoulder arthroplasty reverse shoulder arthroplasty would give the patient long-lasting relief.  At any point, I think it would be reasonable for her to discuss this with a dedicated shoulder surgeon.  Morbid obesity likely would be an issue.  Health Maintenance Exam: The patient's preventative maintenance and recommended screening tests for an annual wellness exam were reviewed in full today. Brought up to date unless services declined.  Counselled on the importance of diet, exercise, and its role in overall health and mortality. The patient's FH and SH was reviewed, including their home life, tobacco status, and drug and alcohol status.  Follow-up in 1 year for physical exam or additional follow-up below.  Disposition: No follow-ups on file.  No future appointments.  Meds ordered this encounter  Medications   lisinopril -hydrochlorothiazide  (ZESTORETIC ) 20-12.5 MG tablet    Sig: Take 1 tablet by mouth daily.    Dispense:  90 tablet    Refill:  3   Medications Discontinued During This Encounter  Medication Reason   acetaminophen  (TYLENOL ) 650 MG CR tablet Patient Preference   docusate sodium  (COLACE) 100  MG capsule Completed Course   lisinopril -hydrochlorothiazide  (ZESTORETIC ) 10-12.5 MG tablet    Orders Placed This Encounter  Procedures   DG Shoulder Left   Ambulatory referral to Gastroenterology    Signed,  Jacques T. Gracen Ringwald, Andrea Hull   Allergies as of 01/28/2024       Reactions   Levofloxacin  Other (See Comments)   Joint pain        Medication List        Accurate as of January 28, 2024 11:59 PM. If you have any questions, ask your nurse or doctor.          STOP taking these medications    acetaminophen  650 MG CR tablet Commonly known as: TYLENOL  Stopped by: Jacques Naziah Weckerly  docusate sodium  100 MG capsule Commonly known as: COLACE Stopped by: Jacques Stormy Connon   lisinopril -hydrochlorothiazide  10-12.5 MG tablet Commonly known as: ZESTORETIC  Replaced by: lisinopril -hydrochlorothiazide  20-12.5 MG tablet Stopped by: Jacques Tinita Brooker       TAKE these medications    diclofenac  75 MG EC tablet Commonly known as: VOLTAREN  TAKE 1 TABLET(75 MG) BY MOUTH TWICE DAILY   DULoxetine  20 MG capsule Commonly known as: CYMBALTA  TAKE 2 CAPSULES(40 MG) BY MOUTH DAILY   lisinopril -hydrochlorothiazide  20-12.5 MG tablet Commonly known as: Zestoretic  Take 1 tablet by mouth daily. Replaces: lisinopril -hydrochlorothiazide  10-12.5 MG tablet Started by: Jacques Prescott Truex   MELATONIN PO Take 15 mg by mouth at bedtime.   terbinafine  1 % cream Commonly known as: LAMISIL  Apply 1 application topically as needed. YEAST INFECTION (UNDER ABDOMINAL FOLDS)

## 2024-01-28 NOTE — Patient Instructions (Addendum)
 Drop Duloxetine  to 20 mg for two weeks  Then take Duloxetine  20 mg every other day for 2 weeks, then stop  Shoulder:  I would see Dr. Josefa Herring at [facility], who is a pure shoulder surgeon

## 2024-01-29 ENCOUNTER — Encounter: Payer: Self-pay | Admitting: Family Medicine

## 2024-01-29 DIAGNOSIS — M19012 Primary osteoarthritis, left shoulder: Secondary | ICD-10-CM | POA: Insufficient documentation

## 2024-01-29 DIAGNOSIS — E782 Mixed hyperlipidemia: Secondary | ICD-10-CM | POA: Insufficient documentation

## 2024-01-29 DIAGNOSIS — Z720 Tobacco use: Secondary | ICD-10-CM | POA: Insufficient documentation

## 2024-02-10 ENCOUNTER — Ambulatory Visit: Payer: Self-pay | Admitting: Family Medicine

## 2024-03-04 ENCOUNTER — Other Ambulatory Visit: Payer: Self-pay | Admitting: Family Medicine

## 2024-03-04 MED ORDER — DICLOFENAC SODIUM 75 MG PO TBEC: 75.0000 mg | DELAYED_RELEASE_TABLET | Freq: Two times a day (BID) | ORAL | 3 refills | Status: AC

## 2024-03-04 NOTE — Telephone Encounter (Signed)
 Copied from CRM [id number]. Topic: Clinical - Medication Refill >> Mar 04, 2024 12:42 PM Aleatha C wrote: Medication:  diclofenac  (VOLTAREN ) 75 MG EC tablet    Has the patient contacted their pharmacy? Yes (Agent: If no, request that the patient contact the pharmacy for the refill. If patient does not wish to contact the pharmacy document the reason why and proceed with request.) (Agent: If yes, when and what did the pharmacy advise?)  This is the patient's preferred pharmacy:  [id number] - [place], [place] - [address] AT [facility] [address] [place] [address] Phone: [phone] Fax: [phone]    Is this the correct pharmacy for this prescription? Yes If no, delete pharmacy and type the correct one.   Has the prescription been filled recently? No  Is the patient out of the medication? No will be out in tomorrow  Has the patient been seen for an appointment in the last year OR does the patient have an upcoming appointment? Yes  Can we respond through MyChart? No  Agent: Please be advised that Rx refills may take up to 3 business days. We ask that you follow-up with your pharmacy.

## 2024-03-04 NOTE — Telephone Encounter (Signed)
 Last office visit 01/28/2024 for CPE.  Last refilled 02/18/2023 for #180 with 3 refills.  Next appt: No future appointments.

## 2024-03-27 ENCOUNTER — Encounter: Payer: Self-pay | Admitting: Family Medicine

## 2024-04-16 ENCOUNTER — Other Ambulatory Visit: Payer: Self-pay | Admitting: Family Medicine

## 2024-05-27 ENCOUNTER — Encounter: Payer: Self-pay | Admitting: Family Medicine

## 2024-06-17 NOTE — Progress Notes (Unsigned)
" ° ° ° °  Teyona Nichelson T. Savayah Waltrip, MD, CAQ Sports Medicine [facility] at [facility] [address] [place], [address]  Phone: [phone]  FAX: [phone]  Andrea Hull - 48 y.o. female  MRN [id number]  Date of Birth: 14-Mar-1976  Date: 06/18/2024  PCP: Watt Mirza, MD  Referral: Watt Mirza, MD  No chief complaint on file.  Subjective:   Andrea Hull is a 48 y.o. very pleasant female patient with There is no height or weight on file to calculate BMI. who presents with the following:  Discussed the use of AI scribe software for clinical note transcription with the patient, who gave verbal consent to proceed.  Patient presents with multijoint pain.  I saw her earlier in the fall with some ongoing shoulder pain, at that point patient had evidence of end-stage osteoarthritis of the glenohumeral joint, quite severe in appearance, particularly for age 48.   History of Present Illness     Review of Systems is noted in the HPI, as appropriate  Objective:   LMP 04/27/2020   GEN: No acute distress; alert,appropriate. PULM: Breathing comfortably in no respiratory distress PSYCH: Normally interactive.   Laboratory and Imaging Data:  Assessment and Plan:   No diagnosis found. Assessment & Plan   Medication Management during today's office visit: No orders of the defined types were placed in this encounter.  There are no discontinued medications.  Orders placed today for conditions managed today: No orders of the defined types were placed in this encounter.   Disposition: No follow-ups on file.  [employer] One speech-to-text software was used for transcription in this dictation.  Possible transcriptional errors can occur using Animal nutritionist.   Signed,  Mirza DASEN. Sagan Maselli, MD   Outpatient Encounter Medications as of 06/18/2024  Medication Sig   diclofenac  (VOLTAREN ) 75 MG EC tablet Take 1 tablet (75 mg total) by mouth 2  (two) times daily.   DULoxetine  (CYMBALTA ) 20 MG capsule TAKE 2 CAPSULES(40 MG) BY MOUTH DAILY   lisinopril -hydrochlorothiazide  (ZESTORETIC ) 20-12.5 MG tablet Take 1 tablet by mouth daily.   MELATONIN PO Take 15 mg by mouth at bedtime.   terbinafine  (LAMISIL ) 1 % cream Apply 1 application topically as needed. YEAST INFECTION (UNDER ABDOMINAL FOLDS)   No facility-administered encounter medications on file as of 06/18/2024.   "

## 2024-06-18 ENCOUNTER — Encounter: Payer: Self-pay | Admitting: Family Medicine

## 2024-06-18 ENCOUNTER — Ambulatory Visit (INDEPENDENT_AMBULATORY_CARE_PROVIDER_SITE_OTHER)
Admission: RE | Admit: 2024-06-18 | Discharge: 2024-06-18 | Disposition: A | Discharge status: Home / Self Care | Source: Ambulatory Visit | Attending: Family Medicine | Admitting: Family Medicine

## 2024-06-18 ENCOUNTER — Ambulatory Visit (INDEPENDENT_AMBULATORY_CARE_PROVIDER_SITE_OTHER): Admitting: Family Medicine

## 2024-06-18 VITALS — BP 130/80 | HR 87 | Temp 97.7°F | Ht 65.0 in | Wt 376.5 lb

## 2024-06-18 DIAGNOSIS — M25561 Pain in right knee: Secondary | ICD-10-CM | POA: Diagnosis not present

## 2024-06-18 DIAGNOSIS — G8929 Other chronic pain: Secondary | ICD-10-CM

## 2024-06-18 DIAGNOSIS — M25511 Pain in right shoulder: Secondary | ICD-10-CM

## 2024-06-18 DIAGNOSIS — M19011 Primary osteoarthritis, right shoulder: Secondary | ICD-10-CM | POA: Diagnosis not present

## 2024-06-18 DIAGNOSIS — M25562 Pain in left knee: Secondary | ICD-10-CM

## 2024-06-18 DIAGNOSIS — M19012 Primary osteoarthritis, left shoulder: Secondary | ICD-10-CM | POA: Diagnosis not present

## 2024-06-18 DIAGNOSIS — M1612 Unilateral primary osteoarthritis, left hip: Secondary | ICD-10-CM | POA: Diagnosis not present

## 2024-06-18 DIAGNOSIS — Z6841 Body Mass Index (BMI) 40.0 and over, adult: Secondary | ICD-10-CM | POA: Diagnosis not present

## 2024-06-18 DIAGNOSIS — M25552 Pain in left hip: Secondary | ICD-10-CM

## 2024-06-18 DIAGNOSIS — M170 Bilateral primary osteoarthritis of knee: Secondary | ICD-10-CM

## 2024-06-18 MED ORDER — ZEPBOUND 2.5 MG/0.5ML ~~LOC~~ SOAJ: 2.5000 mg | SUBCUTANEOUS | 0 refills | Status: DC

## 2024-06-18 MED ORDER — ZEPBOUND 5 MG/0.5ML ~~LOC~~ SOAJ: 5.0000 mg | SUBCUTANEOUS | 0 refills | Status: DC

## 2024-06-18 MED ORDER — ZEPBOUND 7.5 MG/0.5ML ~~LOC~~ SOAJ: 7.5000 mg | SUBCUTANEOUS | 3 refills | Status: DC

## 2024-06-18 NOTE — Patient Instructions (Signed)
 Zepbound 2.5 mg once a week for 4 weeks, then increase to 5 mg for 4 weeks, then increase to 7.5 mg   -- I want to titrate up to 15 mg for you

## 2024-06-20 ENCOUNTER — Encounter: Payer: Self-pay | Admitting: Pharmacist

## 2024-06-20 NOTE — Progress Notes (Signed)
" ° °  Chart Review: Medication Access related to Zepbound .    Medication Access: ?  Zepbound  showing covered in Epic. PCP would like to evaluate cost/potential savings.   Prescription drug coverage: YES Payor: [employer] / Plan: [employer]ARCH SCI[employer]) / Product Type: *No Product type* / .  Summary of Benefit: Rx deductible: $0   Assessment and Plan:   1. Medication Access Zepbound  Prior Auth: Key [id number] (Obesity E66.01) = Drug is not covered by plan Zepbound  Prior Auth: Key [id number] (Mod-Sev Obstructive Sleep Apnea G47.33) = Drug is not covered by plan Georjean Prior Auth: Key: [id number] (Obesity E66.01) = Information regarding your request. Your patient will pay 100% of a discounted price for this medication. Any amount the patient pays will not apply to their deductible or out-of-pocket expenses Test claim = $1,538.13 per month  No future appointments.  Manuelita FABIENE Kobs, PharmD Clinical Pharmacist [facility] [phone]  "

## 2024-06-25 ENCOUNTER — Other Ambulatory Visit (HOSPITAL_COMMUNITY): Payer: Self-pay

## 2024-06-25 ENCOUNTER — Telehealth: Payer: Self-pay

## 2024-06-25 NOTE — Telephone Encounter (Signed)
 Can you let Andrea Hull know that Andrea Hull  is not covered by her insurance.  If she wanted to pay out of pocket, then Andrea Hull is available through the manufacturer for 349 a month.  Andrea Hull  starts at 399 and is 449 for the higher doses.

## 2024-06-25 NOTE — Telephone Encounter (Signed)
 Pharmacy Patient Advocate Encounter   Received notification from [facility] that prior authorization for Zepbound  2.5 is required/requested.   Insurance verification completed.   The patient is insured through [employer].   Per test claim: Per test claim, medication is not covered due to plan/benefit exclusion, PA not submitted at this time

## 2024-06-26 NOTE — Telephone Encounter (Signed)
 Andrea Hull called and wants to move forward with Zepbound  prescription paying out of pocket  Please call patient when available [phone]

## 2024-06-27 MED ORDER — TIRZEPATIDE-WEIGHT MANAGEMENT 7.5 MG/0.5ML ~~LOC~~ SOLN: 7.5000 mg | SUBCUTANEOUS | 3 refills | Status: AC

## 2024-06-27 MED ORDER — TIRZEPATIDE-WEIGHT MANAGEMENT 2.5 MG/0.5ML ~~LOC~~ SOLN: 2.5000 mg | SUBCUTANEOUS | 0 refills | Status: AC

## 2024-06-27 MED ORDER — TIRZEPATIDE-WEIGHT MANAGEMENT 5 MG/0.5ML ~~LOC~~ SOLN: 5.0000 mg | SUBCUTANEOUS | 0 refills | Status: AC

## 2024-06-27 NOTE — Telephone Encounter (Signed)
 I sent Zepbound  to [facility] pharmacy, and they will contact the patient directly.  1st month: Zepbound  2.5 mg once a week 2nd month: Zepbound  5 mg once a week 3rd month: Zepbound  7.5 mg   In the 3rd month, I want to hear how much weight she has lost and how it is going.  We will plan to increase all the way to 15 mg unless she gets side effects.

## 2024-07-21 ENCOUNTER — Encounter: Payer: Self-pay | Admitting: Family Medicine

## 2024-07-22 ENCOUNTER — Other Ambulatory Visit: Payer: Self-pay | Admitting: Family Medicine

## 2024-07-22 NOTE — Telephone Encounter (Signed)
? ° °  Step-up to 5 mg then 7.5 should be available?

## 2024-07-23 MED ORDER — TRAMADOL HCL 50 MG PO TABS: 50.0000 mg | ORAL_TABLET | Freq: Three times a day (TID) | ORAL | 0 refills | Status: AC | PRN
